# Patient Record
Sex: Male | Born: 1935
Health system: Southern US, Community
[De-identification: ages and names within clinical notes are randomized; demographics above are authoritative.]

## PROBLEM LIST (undated history)

## (undated) DIAGNOSIS — I350 Nonrheumatic aortic (valve) stenosis: Secondary | ICD-10-CM

## (undated) DIAGNOSIS — I10 Essential (primary) hypertension: Secondary | ICD-10-CM

## (undated) DIAGNOSIS — F0390 Unspecified dementia without behavioral disturbance: Secondary | ICD-10-CM

## (undated) DIAGNOSIS — I2510 Atherosclerotic heart disease of native coronary artery without angina pectoris: Secondary | ICD-10-CM

## (undated) DIAGNOSIS — E785 Hyperlipidemia, unspecified: Secondary | ICD-10-CM

## (undated) DIAGNOSIS — C44300 Unspecified malignant neoplasm of skin of unspecified part of face: Secondary | ICD-10-CM

## (undated) DIAGNOSIS — H409 Unspecified glaucoma: Secondary | ICD-10-CM

## (undated) DIAGNOSIS — Z953 Presence of xenogenic heart valve: Secondary | ICD-10-CM

## (undated) DIAGNOSIS — J189 Pneumonia, unspecified organism: Secondary | ICD-10-CM

## (undated) DIAGNOSIS — H9190 Unspecified hearing loss, unspecified ear: Secondary | ICD-10-CM

## (undated) DIAGNOSIS — I679 Cerebrovascular disease, unspecified: Secondary | ICD-10-CM

## (undated) HISTORY — PX: RETINAL DETACHMENT SURGERY: SHX105

## (undated) HISTORY — DX: Cerebrovascular disease, unspecified: I67.9

## (undated) HISTORY — PX: EYE SURGERY: SHX253

## (undated) HISTORY — DX: Hyperlipidemia, unspecified: E78.5

## (undated) HISTORY — DX: Nonrheumatic aortic (valve) stenosis: I35.0

## (undated) HISTORY — DX: Atherosclerotic heart disease of native coronary artery without angina pectoris: I25.10

## (undated) HISTORY — PX: CARDIAC CATHETERIZATION: SHX172

## (undated) HISTORY — PX: COLONOSCOPY W/ POLYPECTOMY: SHX1380

## (undated) HISTORY — DX: Essential (primary) hypertension: I10

## (undated) HISTORY — PX: CAROTID ENDARTERECTOMY: SUR193

## (undated) HISTORY — PX: CATARACT EXTRACTION: SUR2

---

## 1999-04-19 ENCOUNTER — Inpatient Hospital Stay (HOSPITAL_COMMUNITY): Admission: EM | Admit: 1999-04-19 | Discharge: 1999-04-22 | Payer: Self-pay

## 1999-04-19 ENCOUNTER — Encounter: Payer: Self-pay | Admitting: Emergency Medicine

## 1999-04-21 ENCOUNTER — Encounter: Payer: Self-pay | Admitting: *Deleted

## 1999-09-17 ENCOUNTER — Other Ambulatory Visit: Admission: RE | Admit: 1999-09-17 | Discharge: 1999-09-17 | Payer: Self-pay | Admitting: Gastroenterology

## 1999-09-17 ENCOUNTER — Encounter (INDEPENDENT_AMBULATORY_CARE_PROVIDER_SITE_OTHER): Payer: Self-pay

## 2004-05-01 ENCOUNTER — Ambulatory Visit: Payer: Self-pay | Admitting: Internal Medicine

## 2005-03-18 ENCOUNTER — Ambulatory Visit: Payer: Self-pay | Admitting: Cardiology

## 2005-04-02 ENCOUNTER — Ambulatory Visit: Payer: Self-pay

## 2005-04-30 ENCOUNTER — Ambulatory Visit: Payer: Self-pay | Admitting: Cardiology

## 2006-01-14 ENCOUNTER — Ambulatory Visit: Payer: Self-pay | Admitting: Gastroenterology

## 2006-02-25 ENCOUNTER — Ambulatory Visit: Payer: Self-pay | Admitting: Gastroenterology

## 2006-02-25 ENCOUNTER — Encounter (INDEPENDENT_AMBULATORY_CARE_PROVIDER_SITE_OTHER): Payer: Self-pay | Admitting: Specialist

## 2006-03-22 ENCOUNTER — Ambulatory Visit: Payer: Self-pay

## 2006-03-24 ENCOUNTER — Ambulatory Visit: Payer: Self-pay | Admitting: Cardiology

## 2006-04-01 ENCOUNTER — Ambulatory Visit: Payer: Self-pay

## 2006-04-01 ENCOUNTER — Encounter: Payer: Self-pay | Admitting: Cardiovascular Disease

## 2007-03-02 ENCOUNTER — Ambulatory Visit: Payer: Self-pay | Admitting: Cardiology

## 2007-03-02 LAB — CONVERTED CEMR LAB
ALT: 15 units/L (ref 0–53)
AST: 18 units/L (ref 0–37)
Albumin: 3.9 g/dL (ref 3.5–5.2)
Alkaline Phosphatase: 38 units/L — ABNORMAL LOW (ref 39–117)
BUN: 22 mg/dL (ref 6–23)
Bilirubin, Direct: 0.1 mg/dL (ref 0.0–0.3)
CO2: 30 meq/L (ref 19–32)
Calcium: 9 mg/dL (ref 8.4–10.5)
Chloride: 110 meq/L (ref 96–112)
Cholesterol: 142 mg/dL (ref 0–200)
Creatinine, Ser: 0.9 mg/dL (ref 0.4–1.5)
GFR calc Af Amer: 106 mL/min
GFR calc non Af Amer: 87 mL/min
Glucose, Bld: 105 mg/dL — ABNORMAL HIGH (ref 70–99)
HDL: 34.3 mg/dL — ABNORMAL LOW (ref >39.0)
LDL Cholesterol: 95 mg/dL (ref 0–99)
Potassium: 4.3 meq/L (ref 3.5–5.1)
Sodium: 145 meq/L (ref 135–145)
Total Bilirubin: 0.9 mg/dL (ref 0.3–1.2)
Total CHOL/HDL Ratio: 4.1
Total Protein: 6.4 g/dL (ref 6.0–8.3)
Triglycerides: 66 mg/dL (ref 0–149)
VLDL: 13 mg/dL (ref 0–40)

## 2007-03-09 ENCOUNTER — Ambulatory Visit: Payer: Self-pay

## 2007-03-09 ENCOUNTER — Encounter: Payer: Self-pay | Admitting: Cardiology

## 2007-06-09 ENCOUNTER — Ambulatory Visit: Payer: Self-pay | Admitting: Cardiology

## 2007-06-09 LAB — CONVERTED CEMR LAB
ALT: 17 units/L (ref 0–53)
AST: 19 units/L (ref 0–37)
Albumin: 3.8 g/dL (ref 3.5–5.2)
Alkaline Phosphatase: 39 units/L (ref 39–117)
Bilirubin, Direct: 0.2 mg/dL (ref 0.0–0.3)
Cholesterol: 133 mg/dL (ref 0–200)
HDL: 37.9 mg/dL — ABNORMAL LOW (ref >39.0)
LDL Cholesterol: 83 mg/dL (ref 0–99)
Total Bilirubin: 0.9 mg/dL (ref 0.3–1.2)
Total CHOL/HDL Ratio: 3.5
Total Protein: 6.3 g/dL (ref 6.0–8.3)
Triglycerides: 60 mg/dL (ref 0–149)
VLDL: 12 mg/dL (ref 0–40)

## 2008-03-29 ENCOUNTER — Ambulatory Visit: Payer: Self-pay | Admitting: Cardiology

## 2008-03-29 ENCOUNTER — Ambulatory Visit: Payer: Self-pay

## 2008-04-10 ENCOUNTER — Ambulatory Visit: Payer: Self-pay | Admitting: Cardiology

## 2008-04-10 ENCOUNTER — Ambulatory Visit: Payer: Self-pay

## 2008-04-10 ENCOUNTER — Encounter: Payer: Self-pay | Admitting: Cardiology

## 2008-04-10 LAB — CONVERTED CEMR LAB
ALT: 14 units/L (ref 0–53)
AST: 19 units/L (ref 0–37)
Albumin: 3.9 g/dL (ref 3.5–5.2)
Alkaline Phosphatase: 34 units/L — ABNORMAL LOW (ref 39–117)
BUN: 12 mg/dL (ref 6–23)
Bilirubin, Direct: 0.2 mg/dL (ref 0.0–0.3)
CO2: 31 meq/L (ref 19–32)
Calcium: 8.9 mg/dL (ref 8.4–10.5)
Chloride: 105 meq/L (ref 96–112)
Cholesterol: 127 mg/dL (ref 0–200)
Creatinine, Ser: 1 mg/dL (ref 0.4–1.5)
GFR calc Af Amer: 93 mL/min
GFR calc non Af Amer: 77 mL/min
Glucose, Bld: 101 mg/dL — ABNORMAL HIGH (ref 70–99)
HDL: 40.8 mg/dL (ref >39.0)
LDL Cholesterol: 73 mg/dL (ref 0–99)
Potassium: 4.1 meq/L (ref 3.5–5.1)
Sodium: 142 meq/L (ref 135–145)
Total Bilirubin: 1.2 mg/dL (ref 0.3–1.2)
Total CHOL/HDL Ratio: 3.1
Total Protein: 6.2 g/dL (ref 6.0–8.3)
Triglycerides: 64 mg/dL (ref 0–149)
VLDL: 13 mg/dL (ref 0–40)

## 2008-11-12 ENCOUNTER — Encounter (INDEPENDENT_AMBULATORY_CARE_PROVIDER_SITE_OTHER): Payer: Self-pay | Admitting: *Deleted

## 2008-12-27 ENCOUNTER — Encounter (INDEPENDENT_AMBULATORY_CARE_PROVIDER_SITE_OTHER): Payer: Self-pay | Admitting: *Deleted

## 2009-01-02 DIAGNOSIS — I080 Rheumatic disorders of both mitral and aortic valves: Secondary | ICD-10-CM | POA: Insufficient documentation

## 2009-01-02 DIAGNOSIS — I359 Nonrheumatic aortic valve disorder, unspecified: Secondary | ICD-10-CM | POA: Insufficient documentation

## 2009-01-02 DIAGNOSIS — R0602 Shortness of breath: Secondary | ICD-10-CM | POA: Insufficient documentation

## 2009-01-02 DIAGNOSIS — I10 Essential (primary) hypertension: Secondary | ICD-10-CM | POA: Insufficient documentation

## 2009-01-02 DIAGNOSIS — E785 Hyperlipidemia, unspecified: Secondary | ICD-10-CM | POA: Insufficient documentation

## 2009-01-02 DIAGNOSIS — I679 Cerebrovascular disease, unspecified: Secondary | ICD-10-CM | POA: Insufficient documentation

## 2009-01-04 ENCOUNTER — Ambulatory Visit: Payer: Self-pay | Admitting: Cardiology

## 2009-01-04 DIAGNOSIS — R209 Unspecified disturbances of skin sensation: Secondary | ICD-10-CM | POA: Insufficient documentation

## 2009-01-11 ENCOUNTER — Ambulatory Visit: Payer: Self-pay | Admitting: Cardiology

## 2009-01-14 LAB — CONVERTED CEMR LAB
ALT: 16 units/L (ref 0–53)
AST: 24 units/L (ref 0–37)
Albumin: 3.8 g/dL (ref 3.5–5.2)
Alkaline Phosphatase: 34 units/L — ABNORMAL LOW (ref 39–117)
BUN: 19 mg/dL (ref 6–23)
Bilirubin, Direct: 0.1 mg/dL (ref 0.0–0.3)
CO2: 29 meq/L (ref 19–32)
Calcium: 8.6 mg/dL (ref 8.4–10.5)
Chloride: 111 meq/L (ref 96–112)
Cholesterol: 132 mg/dL (ref 0–200)
Creatinine, Ser: 0.9 mg/dL (ref 0.4–1.5)
GFR calc non Af Amer: 86.9 mL/min (ref >60)
Glucose, Bld: 98 mg/dL (ref 70–99)
HDL: 45.3 mg/dL (ref >39.00)
LDL Cholesterol: 76 mg/dL (ref 0–99)
Potassium: 3.7 meq/L (ref 3.5–5.1)
Sodium: 143 meq/L (ref 135–145)
Total Bilirubin: 1.1 mg/dL (ref 0.3–1.2)
Total CHOL/HDL Ratio: 3
Total Protein: 6.1 g/dL (ref 6.0–8.3)
Triglycerides: 55 mg/dL (ref 0.0–149.0)
VLDL: 11 mg/dL (ref 0.0–40.0)

## 2009-03-28 ENCOUNTER — Encounter: Payer: Self-pay | Admitting: Cardiology

## 2009-03-28 ENCOUNTER — Ambulatory Visit: Payer: Self-pay

## 2009-07-03 ENCOUNTER — Ambulatory Visit: Payer: Self-pay | Admitting: Cardiology

## 2009-07-12 ENCOUNTER — Telehealth: Payer: Self-pay | Admitting: Gastroenterology

## 2009-07-16 ENCOUNTER — Ambulatory Visit (HOSPITAL_COMMUNITY): Admission: RE | Admit: 2009-07-16 | Discharge: 2009-07-16 | Payer: Self-pay | Admitting: Cardiology

## 2009-07-16 ENCOUNTER — Encounter: Payer: Self-pay | Admitting: Cardiology

## 2009-07-16 ENCOUNTER — Ambulatory Visit: Payer: Self-pay

## 2009-07-16 ENCOUNTER — Ambulatory Visit: Payer: Self-pay | Admitting: Cardiology

## 2009-09-25 ENCOUNTER — Telehealth: Payer: Self-pay | Admitting: Cardiology

## 2009-10-02 ENCOUNTER — Telehealth: Payer: Self-pay | Admitting: Cardiology

## 2010-03-10 ENCOUNTER — Encounter (INDEPENDENT_AMBULATORY_CARE_PROVIDER_SITE_OTHER): Payer: Self-pay | Admitting: *Deleted

## 2010-04-07 ENCOUNTER — Encounter (INDEPENDENT_AMBULATORY_CARE_PROVIDER_SITE_OTHER): Payer: Self-pay | Admitting: *Deleted

## 2010-04-08 ENCOUNTER — Encounter: Payer: Self-pay | Admitting: Cardiology

## 2010-04-09 ENCOUNTER — Ambulatory Visit: Payer: Self-pay | Admitting: Gastroenterology

## 2010-04-09 ENCOUNTER — Ambulatory Visit: Payer: Self-pay

## 2010-04-09 ENCOUNTER — Encounter: Payer: Self-pay | Admitting: Cardiology

## 2010-05-02 ENCOUNTER — Ambulatory Visit: Payer: Self-pay | Admitting: Gastroenterology

## 2010-05-02 ENCOUNTER — Encounter: Payer: Self-pay | Admitting: Cardiology

## 2010-05-05 ENCOUNTER — Encounter: Payer: Self-pay | Admitting: Cardiology

## 2010-05-05 ENCOUNTER — Encounter: Payer: Self-pay | Admitting: Gastroenterology

## 2010-05-07 ENCOUNTER — Encounter (INDEPENDENT_AMBULATORY_CARE_PROVIDER_SITE_OTHER): Payer: Self-pay | Admitting: *Deleted

## 2010-05-07 DIAGNOSIS — R002 Palpitations: Secondary | ICD-10-CM | POA: Insufficient documentation

## 2010-05-09 ENCOUNTER — Ambulatory Visit (HOSPITAL_COMMUNITY): Admission: RE | Admit: 2010-05-09 | Discharge: 2010-05-09 | Payer: Self-pay | Admitting: Cardiology

## 2010-05-09 ENCOUNTER — Ambulatory Visit: Payer: Self-pay

## 2010-05-09 ENCOUNTER — Ambulatory Visit: Payer: Self-pay | Admitting: Cardiology

## 2010-05-09 ENCOUNTER — Encounter: Payer: Self-pay | Admitting: Cardiology

## 2010-05-13 ENCOUNTER — Ambulatory Visit: Payer: Self-pay | Admitting: Cardiology

## 2010-05-13 ENCOUNTER — Encounter: Payer: Self-pay | Admitting: Cardiology

## 2010-05-13 DIAGNOSIS — I4949 Other premature depolarization: Secondary | ICD-10-CM | POA: Insufficient documentation

## 2010-05-16 ENCOUNTER — Ambulatory Visit: Payer: Self-pay | Admitting: Cardiology

## 2010-05-19 ENCOUNTER — Encounter: Payer: Self-pay | Admitting: Cardiology

## 2010-05-19 LAB — CONVERTED CEMR LAB
ALT: 25 units/L (ref 0–53)
AST: 39 units/L — ABNORMAL HIGH (ref 0–37)
Albumin: 4 g/dL (ref 3.5–5.2)
Alkaline Phosphatase: 35 units/L — ABNORMAL LOW (ref 39–117)
BUN: 18 mg/dL (ref 6–23)
Bilirubin, Direct: 0.2 mg/dL (ref 0.0–0.3)
CO2: 29 meq/L (ref 19–32)
Calcium: 8.8 mg/dL (ref 8.4–10.5)
Chloride: 105 meq/L (ref 96–112)
Cholesterol: 121 mg/dL (ref 0–200)
Creatinine, Ser: 1 mg/dL (ref 0.4–1.5)
GFR calc non Af Amer: 74.11 mL/min (ref >60)
Glucose, Bld: 86 mg/dL (ref 70–99)
HDL: 42.3 mg/dL (ref >39.00)
LDL Cholesterol: 71 mg/dL (ref 0–99)
Potassium: 4.2 meq/L (ref 3.5–5.1)
Sodium: 142 meq/L (ref 135–145)
Total Bilirubin: 1.2 mg/dL (ref 0.3–1.2)
Total CHOL/HDL Ratio: 3
Total Protein: 5.9 g/dL — ABNORMAL LOW (ref 6.0–8.3)
Triglycerides: 41 mg/dL (ref 0.0–149.0)
VLDL: 8.2 mg/dL (ref 0.0–40.0)

## 2010-07-01 ENCOUNTER — Telehealth: Payer: Self-pay | Admitting: Cardiology

## 2010-07-29 NOTE — Letter (Signed)
Summary: Letter from Patient to Doctor  Letter from Patient to Doctor   Imported By: Marylou Mccoy 05/13/2010 15:13:39  _____________________________________________________________________  External Attachment:    Type:   Image     Comment:   External Document

## 2010-07-29 NOTE — Letter (Signed)
Summary: Custom - Lipid  [facility], Main Office  [address]   [place], [place] [address]   Phone: [phone]  Fax: [phone]     May 19, 2010 MRN: [id number]   [name] [address] [place], [place]  [address]   Dear Mr. FEHRINGER,  We have reviewed your cholesterol results.  They are as follows:     Total Cholesterol:    121 (Desirable: less than 200)       HDL  Cholesterol:     42.30  (Desirable: greater than 40 for men and 50 for women)       LDL Cholesterol:       71  (Desirable: less than 100 for low risk and less than 70 for moderate to high risk)       Triglycerides:       41.0  (Desirable: less than 150)  Our recommendations include: NO CHANGES KEEP DOING WHAT YOUR DOING   Call our office at the number listed above if you have any questions.  Lowering your LDL cholesterol is important, but it is only one of a large number of "risk factors" that may indicate that you are at risk for heart disease, stroke or other complications of hardening of the arteries.  Other risk factors include:   A.  Cigarette Smoking* B.  High Blood Pressure* C.  Obesity* D.   Low HDL Cholesterol (see yours above)* E.   Diabetes Mellitus (higher risk if your is uncontrolled) F.  Family history of premature heart disease G.  Previous history of stroke or cardiovascular disease    *These are risk factors YOU HAVE CONTROL OVER.  For more information, visit .  There is now evidence that lowering the TOTAL CHOLESTEROL AND LDL CHOLESTEROL can reduce the risk of heart disease.  The American Heart Association recommends the following guidelines for the treatment of elevated cholesterol:  1.  If there is now current heart disease and less than two risk factors, TOTAL CHOLESTEROL should be less than 200 and LDL CHOLESTEROL should be less than 100. 2.  If there is current heart disease or two or more risk factors, TOTAL CHOLESTEROL should be less than 200 and LDL  CHOLESTEROL should be less than 70.  A diet low in cholesterol, saturated fat, and calories is the cornerstone of treatment for elevated cholesterol.  Cessation of smoking and exercise are also important in the management of elevated cholesterol and preventing vascular disease.  Studies have shown that 30 to 60 minutes of physical activity most days can help lower blood pressure, lower cholesterol, and keep your weight at a healthy level.  Drug therapy is used when cholesterol levels do not respond to therapeutic lifestyle changes (smoking cessation, diet, and exercise) and remains unacceptably high.  If medication is started, it is important to have you levels checked periodically to evaluate the need for further treatment options.  Thank you,   CHRISTINE YORK LPN [employer] Team

## 2010-07-29 NOTE — Assessment & Plan Note (Signed)
Summary: 6 MO F/U  Medications Added NIASPAN 1000 MG CR-TABS (NIACIN (ANTIHYPERLIPIDEMIC)) 1 tab by mouth once daily        History of Present Illness: [name] is a pleasant  gentleman who has a history of cerebrovascular disease status post carotid endarterectomy, hypertension, hyperlipidemia, and moderate aortic stenosis. Last echocardiogram in October of 2009 showed normal LV function and moderate aortic stenosis with a mean gradient of 22 mmHg. There was mild left atrial enlargement. Last carotid Dopplers in September, 2010 showed 0-39% bilateral stenosis. I last saw him in July of 2010. Since then the patient has dyspnea with more extreme activities and not with routine activities. It is relieved with rest. It is not associated with chest pain. There is no orthopnea, PND or pedal edema. There is no syncope or palpitations. There is no exertional chest pain.   Current Medications (verified): 1)  Metoprolol Succinate 50 Mg Xr24h-Tab (Metoprolol Succinate) .... Take One Tablet By Mouth Daily 2)  Amlodipine Besylate 5 Mg Tabs (Amlodipine Besylate) .... Take One Tablet By Mouth Daily 3)  Aspirin Ec 325 Mg Tbec (Aspirin) .... Take One Tablet By Mouth Daily 4)  Zetia 10 Mg Tabs (Ezetimibe) .... Take One Tablet By Mouth Daily. 5)  Dorzolamide Hcl 2 % Soln (Dorzolamide Hcl) .... One Drop To Each Eye Twice Daily 6)  Xalatan 0.005 % Soln (Latanoprost) .... One Drop To Each Eye At Bedtime 7)  Niaspan 1000 Mg Cr-Tabs (Niacin (Antihyperlipidemic)) .Marland Kitchen.. 1 Tab By Mouth Once Daily  Allergies: No Known Drug Allergies  Past History:  Past Medical History: Current Problems:  CEREBROVASCULAR DISEASE (ICD-437.9) AORTIC STENOSIS (ICD-424.1) HYPERLIPIDEMIA (ICD-272.4) HYPERTENSION (ICD-401.9)  Past Surgical History: Reviewed history from 01/02/2009 and no changes required.  carotid endarterectomy,   Social History: Reviewed history from 01/02/2009 and no changes required. Married  Tobacco Use -  No.  Alcohol Use - no  Review of Systems       no fevers or chills, productive cough, hemoptysis, dysphasia, odynophagia, melena, hematochezia, dysuria, hematuria, rash, seizure activity, orthopnea, PND, pedal edema, claudication. Remaining systems are negative.   Vital Signs:  Patient profile:   77 year old male Height:      68 inches Weight:      162 pounds BMI:     24.72 Pulse rate:   74 / minute Resp:     12 per minute BP sitting:   122 / 80  (left arm)  Vitals Entered By: Kem Parkinson (July 03, 2009 9:09 AM)  Physical Exam  General:  Well-developed well-nourished in no acute distress.  Skin is warm and dry.  HEENT is normal.  Neck is supple. No thyromegaly.  Chest is clear to auscultation with normal expansion.  Cardiovascular exam is regular rate and rhythm. 3/6 systolic murmur at the left sternal border radiating to the carotids. S2 is not diminished. 2/6 diastolic murmur left sternal border. Abdominal exam nontender or distended. No masses palpated. Extremities show no edema. neuro grossly intact    EKG  Procedure date:  07/03/2009  Findings:      Sinus rhythm with frequent PACs and a rare PVC. Nonspecific ST changes.  Impression & Recommendations:  Problem # 1:  CEREBROVASCULAR DISEASE (ICD-437.9) Continue aspirin and statin. Followup carotid Dopplers one year.  Problem # 2:  AORTIC STENOSIS (ICD-424.1)  Plan repeat echocardiogram. No symptoms of chest pain, increased dyspnea or syncope. May need aortic valve replacement in the future. His updated medication list for this problem includes:  Metoprolol Succinate 50 Mg Xr24h-tab (Metoprolol succinate) .Marland Kitchen... Take one tablet by mouth daily  His updated medication list for this problem includes:    Metoprolol Succinate 50 Mg Xr24h-tab (Metoprolol succinate) .Marland Kitchen... Take one tablet by mouth daily  Orders: Echocardiogram (Echo)  Problem # 3:  HYPERLIPIDEMIA (ICD-272.4)  Continue present  medications. History of intolerance to statins. His updated medication list for this problem includes:    Zetia 10 Mg Tabs (Ezetimibe) .Marland Kitchen... Take one tablet by mouth daily.    Niaspan 1000 Mg Cr-tabs (Niacin (antihyperlipidemic)) .Marland Kitchen... 1 tab by mouth once daily  His updated medication list for this problem includes:    Zetia 10 Mg Tabs (Ezetimibe) .Marland Kitchen... Take one tablet by mouth daily.  Problem # 4:  HYPERTENSION (ICD-401.9)  His updated medication list for this problem includes:    Metoprolol Succinate 50 Mg Xr24h-tab (Metoprolol succinate) .Marland Kitchen... Take one tablet by mouth daily    Amlodipine Besylate 5 Mg Tabs (Amlodipine besylate) .Marland Kitchen... Take one tablet by mouth daily    Aspirin Ec 325 Mg Tbec (Aspirin) .Marland Kitchen... Take one tablet by mouth daily  His updated medication list for this problem includes:    Metoprolol Succinate 50 Mg Xr24h-tab (Metoprolol succinate) .Marland Kitchen... Take one tablet by mouth daily    Amlodipine Besylate 5 Mg Tabs (Amlodipine besylate) .Marland Kitchen... Take one tablet by mouth daily    Aspirin Ec 325 Mg Tbec (Aspirin) .Marland Kitchen... Take one tablet by mouth daily  Patient Instructions: 1)  Your physician recommends that you schedule a follow-up appointment in: ONE YEAR 2)  Your physician has requested that you have an echocardiogram.  Echocardiography is a painless test that uses sound waves to create images of your heart. It provides your doctor with information about the size and shape of your heart and how well your heart's chambers and valves are working.  This procedure takes approximately one hour. There are no restrictions for this procedure.

## 2010-07-29 NOTE — Procedures (Signed)
Summary: Colonoscopy  Patient: [name] Note: All result statuses are Final unless otherwise noted.  Tests: (1) Colonoscopy (COL)   COL Colonoscopy           DONE     [facility]     [address]     [place], [place]  [address]           COLONOSCOPY PROCEDURE REPORT     PATIENT:  [name], [name]  MR#:  [id number]     BIRTHDATE:  28-Dec-1929, 80 yrs. old  GENDER:  male     ENDOSCOPIST:  Judie Petit T. Russella Dar, MD, [facility]           PROCEDURE DATE:  05/02/2010     PROCEDURE:  Colonoscopy with snare polypectomy     ASA CLASS:  Class II     INDICATIONS:  1) surveillance and high-risk screening  2) history     of adenomatous colon polyps  3) family history of colon cancer,     tubulovillous adenoma, 08/1999.     MEDICATIONS:   Fentanyl 25 mcg IV, Versed 2 mg IV     DESCRIPTION OF PROCEDURE:   After the risks benefits and     alternatives of the procedure were thoroughly explained, informed     consent was obtained.  Digital rectal exam was performed and     revealed no abnormalities.   The LB [id number] endoscope     was introduced through the anus and advanced to the cecum, which     was identified by both the appendix and ileocecal valve, without     limitations.  The quality of the prep was excellent, using     MoviPrep.  The instrument was then slowly withdrawn as the colon     was fully examined.     <<PROCEDUREIMAGES>>     FINDINGS:  A sessile polyp was found at the hepatic flexure. It     was 5 mm in size. Polyp was snared without cautery. Retrieval was     successful. A sessile polyp was found in the distal transverse     colon. It was 6 mm in size. Polyp was snared without cautery.     Retrieval was successful. Mild diverticulosis was found in the     sigmoid colon. This was otherwise a normal examination of the     colon. Retroflexed views in the rectum revealed internal     hemorrhoids, moderate.  The time to cecum =  2.75  minutes. The     scope was then withdrawn  (time =  11 min) from the patient and the     procedure completed.           COMPLICATIONS:  None           ENDOSCOPIC IMPRESSION:     1) 5 mm sessile polyp at the hepatic flexure     2) 6 mm sessile polyp in the distal transverse colon     3) Mild diverticulosis in the sigmoid colon     4) Internal hemorrhoids           RECOMMENDATIONS:     1) Await pathology results     2) High fiber diet with liberal fluid intake.     3) Repeat Colonoscopy in 3 years.           Venita Lick. Russella Dar, MD, [facility]           n.  eSIGNED:   Malcolm T. Stark at 05/02/2010 10:52 AM           Hermelinda Dellen, [id number]  Note: An exclamation mark (!) indicates a result that was not dispersed into the flowsheet. Document Creation Date: 05/02/2010 10:52 AM _______________________________________________________________________  (1) Order result status: Final Collection or observation date-time: 05/02/2010 10:45 Requested date-time:  Receipt date-time:  Reported date-time:  Referring Physician:   Ordering Physician: Claudette Head [phone]) Specimen Source:  Source: Launa Grill Order Number: [id number] Lab site:   Appended Document: Colonoscopy     Procedures Next Due Date:    Colonoscopy: 04/2013

## 2010-07-29 NOTE — Progress Notes (Signed)
Summary: refill**[facility]**   Phone Note Refill Request Call back at Home Phone [phone] Message from:  Patient on October 02, 2009 2:54 PM  Refills Requested: Medication #1:  RAMIPRIL 5 MG CAPS Take one capsule by mouth daily   Supply Requested: 3 months [facility] [phone]   Method Requested: Fax to Mail Away Pharmacy Initial call taken by: Migdalia Dk,  October 02, 2009 2:54 PM Caller: Patient    Prescriptions: RAMIPRIL 5 MG CAPS (RAMIPRIL) Take one capsule by mouth daily  #90 x 3   Entered by:   Kem Parkinson   Authorized by:   Ferman Hamming, MD, [facility]   Signed by:   Kem Parkinson on 10/02/2009   Method used:   Electronically to        [facility]* (retail)       [address]       [place], [place]  [address]       Ph: [phone]       Fax: [phone]   RxID:   [id number]

## 2010-07-29 NOTE — Assessment & Plan Note (Signed)
Summary: rov/discuss echo/dm    History of Present Illness: [name] is a pleasant  gentleman who has a history of cerebrovascular disease status post carotid endarterectomy, hypertension, hyperlipidemia, and aortic stenosis. Last echocardiogram in Nov 2011 showed normal LV function and severe AS per JK with a mean gradient of 36 mmHg. There was mild AI. There was mild right atrial  and right ventricular enlargement. Last carotid Dopplers in Oct 2011showed 0-39% bilateral stenosis and F/U recommended in 2 years. I last saw [name] in Jan 2011. Since then the patient has dyspnea with more extreme activities and not with routine activities. It is relieved with rest. It is not associated with chest pain. There is no orthopnea, PND or pedal edema. There is no syncope or palpitations. There is no exertional chest pain. His symptoms are unchanged compared to previous.  Current Medications (verified): 1)  Metoprolol Succinate 50 Mg Xr24h-Tab (Metoprolol Succinate) .... Take One Tablet By Mouth Daily 2)  Ramipril 5 Mg Caps (Ramipril) .... Take One Capsule By Mouth Daily 3)  Aspirin Ec 325 Mg Tbec (Aspirin) .... Take One Tablet By Mouth Daily 4)  Zetia 10 Mg Tabs (Ezetimibe) .... Take One Tablet By Mouth Daily. 5)  Dorzolamide Hcl 2 % Soln (Dorzolamide Hcl) .... One Drop To Each Eye Twice Daily 6)  Xalatan 0.005 % Soln (Latanoprost) .... One Drop To Each Eye At Bedtime 7)  Niaspan 1000 Mg Cr-Tabs (Niacin (Antihyperlipidemic)) .Marland Kitchen.. 1 Tab By Mouth Once Daily  Allergies: No Known Drug Allergies  Past History:  Past Medical History: CEREBROVASCULAR DISEASE (ICD-437.9) AORTIC STENOSIS (ICD-424.1) HYPERLIPIDEMIA (ICD-272.4) HYPERTENSION (ICD-401.9)  Past Surgical History:  carotid endarterectomy  Social History: Reviewed history from 01/02/2009 and no changes required. Married  Tobacco Use - No.  Alcohol Use - no  Review of Systems       no fevers or chills, productive cough, hemoptysis,  dysphasia, odynophagia, melena, hematochezia, dysuria, hematuria, rash, seizure activity, orthopnea, PND, pedal edema, claudication. Remaining systems are negative.   Vital Signs:  Patient profile:   77 year old male Height:      68 inches Weight:      160 pounds BMI:     24.42 Pulse rate:   70 / minute Resp:     12 per minute BP sitting:   144 / 54  (left arm)  Vitals Entered By: Kem Parkinson (May 13, 2010 12:02 PM)  Physical Exam  General:  Well-developed well-nourished in no acute distress.  Skin is warm and dry.  HEENT is normal.  Neck is supple. No thyromegaly.  Chest is clear to auscultation with normal expansion.  Cardiovascular exam is regular rate and rhythm. 3/6 systolic murmur left sternal border. S2 is not diminished. Abdominal exam nontender or distended. No masses palpated. Extremities show no edema. neuro grossly intact    EKG  Procedure date:  05/13/2010  Findings:      Sinus rhythm with ventricular bigeminy.  Impression & Recommendations:  Problem # 1:  CEREBROVASCULAR DISEASE (ICD-437.9) Continue aspirin. Intolerant to statins. Followup carotid Dopplers October 2013.  Problem # 2:  AORTIC STENOSIS (ICD-424.1) Patient's aortic stenosis has progressed. He is now moderate to severe. However he is not having symptoms. Discussed importance of watching for new onset dyspnea, chest pain or syncope. Plan repeat echocardiogram in 6 months and followup at that time. He will most likely need aortic valve replacement in the future. His updated medication list for this problem includes:    Metoprolol Succinate 50 Mg  Xr24h-tab (Metoprolol succinate) .Marland Kitchen... Take one tablet by mouth daily    Ramipril 5 Mg Caps (Ramipril) .Marland Kitchen... Take one capsule by mouth daily  His updated medication list for this problem includes:    Metoprolol Succinate 50 Mg Xr24h-tab (Metoprolol succinate) .Marland Kitchen... Take one tablet by mouth daily    Ramipril 5 Mg Caps (Ramipril) .Marland Kitchen... Take one  capsule by mouth daily  Problem # 3:  HYPERLIPIDEMIA (ICD-272.4)  Continue present medications. Intolerant to statins. Check lipids and liver. His updated medication list for this problem includes:    Zetia 10 Mg Tabs (Ezetimibe) .Marland Kitchen... Take one tablet by mouth daily.    Niaspan 1000 Mg Cr-tabs (Niacin (antihyperlipidemic)) .Marland Kitchen... 1 tab by mouth once daily  His updated medication list for this problem includes:    Zetia 10 Mg Tabs (Ezetimibe) .Marland Kitchen... Take one tablet by mouth daily.    Niaspan 1000 Mg Cr-tabs (Niacin (antihyperlipidemic)) .Marland Kitchen... 1 tab by mouth once daily  Problem # 4:  HYPERTENSION (ICD-401.9)  Blood pressure controlled. Continue present medications. Check potassium and renal function. His updated medication list for this problem includes:    Metoprolol Succinate 50 Mg Xr24h-tab (Metoprolol succinate) .Marland Kitchen... Take one tablet by mouth daily    Ramipril 5 Mg Caps (Ramipril) .Marland Kitchen... Take one capsule by mouth daily    Aspirin Ec 325 Mg Tbec (Aspirin) .Marland Kitchen... Take one tablet by mouth daily  His updated medication list for this problem includes:    Metoprolol Succinate 50 Mg Xr24h-tab (Metoprolol succinate) .Marland Kitchen... Take one tablet by mouth daily    Ramipril 5 Mg Caps (Ramipril) .Marland Kitchen... Take one capsule by mouth daily    Aspirin Ec 325 Mg Tbec (Aspirin) .Marland Kitchen... Take one tablet by mouth daily  Problem # 5:  PREMATURE VENTRICULAR CONTRACTIONS (ICD-427.69) Patient noted to have occasional PVCs and couplets at time of recent colonoscopy. He is having no symptoms. LV function normal. Continue beta blocker. His updated medication list for this problem includes:    Metoprolol Succinate 50 Mg Xr24h-tab (Metoprolol succinate) .Marland Kitchen... Take one tablet by mouth daily    Ramipril 5 Mg Caps (Ramipril) .Marland Kitchen... Take one capsule by mouth daily    Aspirin Ec 325 Mg Tbec (Aspirin) .Marland Kitchen... Take one tablet by mouth daily  Patient Instructions: 1)  Your physician recommends that you return for lab work FASTING 2)   Your physician wants you to follow-up in:6 MONTHS   You will receive a reminder letter in the mail two months in advance. If you don't receive a letter, please call our office to schedule the follow-up appointment. 3)  Your physician has requested that you have an echocardiogram.  Echocardiography is a painless test that uses sound waves to create images of your heart. It provides your doctor with information about the size and shape of your heart and how well your heart's chambers and valves are working.  This procedure takes approximately one hour. There are no restrictions for this procedure.SCHEDULE PRIOR TO APPT IN 6 MONTHS

## 2010-07-29 NOTE — Letter (Signed)
Summary: Pre Visit Letter Revised  [facility]  [address] [place], [place] [address]   Phone: [phone]  Fax: [phone]        03/10/2010 MRN: [id number] [name] [address] [place], [place]  [address]             Procedure Date:  05/02/2010   Welcome to the Gastroenterology Division at [facility].    You are scheduled to see a nurse for your pre-procedure visit on 04/09/2010 at 2:30PM on the 3rd floor at [facility], [address]  We ask that you try to arrive at our office 15 minutes prior to your appointment time to allow for check-in.  Please take a minute to review the attached form.  If you answer "Yes" to one or more of the questions on the first page, we ask that you call the person listed at your earliest opportunity.  If you answer "No" to all of the questions, please complete the rest of the form and bring it to your appointment.    Your nurse visit will consist of discussing your medical and surgical history, your immediate family medical history, and your medications.   If you are unable to list all of your medications on the form, please bring the medication bottles to your appointment and we will list them.  We will need to be aware of both prescribed and over the counter drugs.  We will need to know exact dosage information as well.    Please be prepared to read and sign documents such as consent forms, a financial agreement, and acknowledgement forms.  If necessary, and with your consent, a friend or relative is welcome to sit-in on the nurse visit with you.  Please bring your insurance card so that we may make a copy of it.  If your insurance requires a referral to see a specialist, please bring your referral form from your primary care physician.  No co-pay is required for this nurse visit.     If you cannot keep your appointment, please call [phone] to cancel or reschedule prior to your appointment date.  This  allows [place] the opportunity to schedule an appointment for another patient in need of care.    Thank you for choosing [facility] for your medical needs.  We appreciate the opportunity to care for you.  Please visit [place] at our website  to learn more about our practice.  Sincerely, The Gastroenterology Division

## 2010-07-29 NOTE — Letter (Signed)
Summary: Patient Notice- Polyp Results  [facility]  [address] [place], [place] [address]   Phone: [phone]  Fax: [phone]        May 05, 2010 MRN: [id number]    [name] [address] [place], [place]  [address]    Dear Mr. REMO,  I am pleased to inform you that the colon polyp(s) removed during your recent colonoscopy was (were) found to be benign (no cancer detected) upon pathologic examination.  I recommend you have a repeat colonoscopy examination in 3 years to look for recurrent polyps, as having colon polyps increases your risk for having recurrent polyps or even colon cancer in the future.  Should you develop new or worsening symptoms of abdominal pain, bowel habit changes or bleeding from the rectum or bowels, please schedule an evaluation with either your primary care physician or with me.  Continue treatment plan as outlined the day of your exam.  Please call us if you are having persistent problems or have questions about your condition that have not been fully answered at this time.  Sincerely,  Meryl Dare MD [facility]  This letter has been electronically signed by your physician.  Appended Document: Patient Notice- Polyp Results letter mailed

## 2010-07-29 NOTE — Letter (Signed)
Summary: [facility] Instructions  [facility]  [address] [place], [place] [address]   Phone: [phone]  Fax: [phone]       [name]    Sep 03, 1929    MRN: [id number]        Procedure Day /Date:  Friday 05/02/2010     Arrival Time: 9:00 am      Procedure Time: 10:00 am     Location of Procedure:                    _x _  [facility] (4th Floor)                        PREPARATION FOR COLONOSCOPY WITH MOVIPREP   Starting 5 days prior to your procedure Sunday 10/30 do not eat nuts, seeds, popcorn, corn, beans, peas,  salads, or any raw vegetables.  Do not take any fiber supplements (e.g. Metamucil, Citrucel, and Benefiber).  THE DAY BEFORE YOUR PROCEDURE         DATE: Thursday 11/3  1.  Drink clear liquids the entire day-NO SOLID FOOD  2.  Do not drink anything colored red or purple.  Avoid juices with pulp.  No orange juice.  3.  Drink at least 64 oz. (8 glasses) of fluid/clear liquids during the day to prevent dehydration and help the prep work efficiently.  CLEAR LIQUIDS INCLUDE: Water Jello Ice Popsicles Tea (sugar ok, no milk/cream) Powdered fruit flavored drinks Coffee (sugar ok, no milk/cream) Gatorade Juice: apple, white grape, white cranberry  Lemonade Clear bullion, consomm, broth Carbonated beverages (any kind) Strained chicken noodle soup Hard Candy                             4.  In the morning, mix first dose of MoviPrep solution:    Empty 1 Pouch A and 1 Pouch B into the disposable container    Add lukewarm drinking water to the top line of the container. Mix to dissolve    Refrigerate (mixed solution should be used within 24 hrs)  5.  Begin drinking the prep at 5:00 p.m. The MoviPrep container is divided by 4 marks.   Every 15 minutes drink the solution down to the next mark (approximately 8 oz) until the full liter is complete.   6.  Follow completed prep with 16 oz of clear liquid of your choice (Nothing red  or purple).  Continue to drink clear liquids until bedtime.  7.  Before going to bed, mix second dose of MoviPrep solution:    Empty 1 Pouch A and 1 Pouch B into the disposable container    Add lukewarm drinking water to the top line of the container. Mix to dissolve    Refrigerate  THE DAY OF YOUR PROCEDURE      DATE: Friday 11/4  Beginning at 5:00 a.m. (5 hours before procedure):         1. Every 15 minutes, drink the solution down to the next mark (approx 8 oz) until the full liter is complete.  2. Follow completed prep with 16 oz. of clear liquid of your choice.    3. You may drink clear liquids until 8:00 am (2 HOURS BEFORE PROCEDURE).   MEDICATION INSTRUCTIONS  Unless otherwise instructed, you should take regular prescription medications with a small sip of water   as early as possible the morning of  your procedure.          OTHER INSTRUCTIONS  You will need a responsible adult at least 75 years of age to accompany you and drive you home.   This person must remain in the waiting room during your procedure.  Wear loose fitting clothing that is easily removed.  Leave jewelry and other valuables at home.  However, you may wish to bring a book to read or  an iPod/MP3 player to listen to music as you wait for your procedure to start.  Remove all body piercing jewelry and leave at home.  Total time from sign-in until discharge is approximately 2-3 hours.  You should go home directly after your procedure and rest.  You can resume normal activities the  day after your procedure.  The day of your procedure you should not:   Drive   Make legal decisions   Operate machinery   Drink alcohol   Return to work  You will receive specific instructions about eating, activities and medications before you leave.    The above instructions have been reviewed and explained to me by   Durwin Glaze RN  April 09, 2010 1:35 PM     I fully understand and can verbalize  these instructions _____________________________ Date _________

## 2010-07-29 NOTE — Miscellaneous (Signed)
Summary: Orders Update  Clinical Lists Changes  Orders: Added new Test order of Carotid Duplex (Carotid Duplex) - Signed 

## 2010-07-29 NOTE — Miscellaneous (Signed)
Summary: [facility]  Clinical Lists Changes  Medications: Added new medication of MOVIPREP 100 GM  SOLR (PEG-KCL-NACL-NASULF-NA ASC-C) As per prep instructions. - Signed Rx of MOVIPREP 100 GM  SOLR (PEG-KCL-NACL-NASULF-NA ASC-C) As per prep instructions.;  #1 x 0;  Signed;  Entered by: Durwin Glaze RN;  Authorized by: Meryl Dare MD [facility];  Method used: Print then Give to Patient Observations: Added new observation of NKA: T (04/09/2010 13:17)    Prescriptions: MOVIPREP 100 GM  SOLR (PEG-KCL-NACL-NASULF-NA ASC-C) As per prep instructions.  #1 x 0   Entered by:   Durwin Glaze RN   Authorized by:   Meryl Dare MD [facility]   Signed by:   Durwin Glaze RN on 04/09/2010   Method used:   Print then Give to Patient   RxID:   [id number]

## 2010-07-29 NOTE — Progress Notes (Signed)
Summary: Schedule recall colon   Phone Note Outgoing Call Call back at [facility] Phone [phone]   Call placed by: Christie Nottingham CMA Duncan Dull),  July 12, 2009 4:16 PM Call placed to: Patient Summary of Call: Left a message with pts wife to call back and schedule recall colon. Initial call taken by: Christie Nottingham CMA Duncan Dull),  July 12, 2009 4:16 PM  Follow-up for Phone Call        Pt called back and states he wants to call back and schedule this procedure when the weather gets warmer. Pt would not give a specific month when he wanted to schedule. I informed pt the importance of scheduling this procedure now but he declines to schedule at this time.  Follow-up by: Christie Nottingham CMA Duncan Dull),  July 15, 2009 8:44 AM

## 2010-07-29 NOTE — Miscellaneous (Signed)
  Clinical Lists Changes received note from pt asking about EKGs done during colonoscopy. per dr Jens Som pt scheduled for echo and f/u appt. Deliah Goody, RN  May 07, 2010 2:52 PM   Problems: Added new problem of PALPITATIONS (ICD-785.1) Orders: Added new Referral order of Echocardiogram (Echo) - Signed

## 2010-07-29 NOTE — Progress Notes (Signed)
Summary: QUESTION ABOUT MEDICATION  Medications Added RAMIPRIL 5 MG CAPS (RAMIPRIL) Take one capsule by mouth daily       Phone Note Call from Patient Call back at [facility] Phone [phone]   Caller: Patient Summary of Call: PT CALLING WITH QUESTIONS ABOUT A MEDICATION (AMLODIPINE) Initial call taken by: Judie Grieve,  September 25, 2009 11:18 AM  Follow-up for Phone Call        spoke with pt, he recieved a 90 day supply of amlodipine from [facility] and he has never taken this med before. he states he has always taken ramapril 5mg  daily. per dr Jens Som pt to remain on ramapril 5mg  daily. Deliah Goody, RN  September 25, 2009 5:20 PM    Additional Follow-up for Phone Call Additional follow up Details #1::        spoke with [facility] and script was changed to ramapril. pt aware Deliah Goody, RN  September 26, 2009 12:31 PM     New/Updated Medications: RAMIPRIL 5 MG CAPS (RAMIPRIL) Take one capsule by mouth daily Prescriptions: RAMIPRIL 5 MG CAPS (RAMIPRIL) Take one capsule by mouth daily  #30 x 12   Entered by:   Deliah Goody, RN   Authorized by:   Ferman Hamming, MD, [facility]   Signed by:   Deliah Goody, RN on 09/25/2009   Method used:   Faxed to ...       [facility]       [address]       [place], [place]  [address]       Ph: [phone] or [phone]       Fax: [phone]   RxID:   [id number]

## 2010-07-31 NOTE — Progress Notes (Signed)
Summary: refill request-req call in today-almost out   Phone Note Refill Request Message from:  [facility]  Refills Requested: Medication #1:  METOPROLOL SUCCINATE 50 MG XR24H-TAB Take one tablet by mouth daily  Medication #2:  RAMIPRIL 5 MG CAPS Take one capsule by mouth daily pt almost out-was in [place] so he waited till the 1st of january-can be called in today?   Method Requested: Telephone to Pharmacy Initial call taken by: Glynda Jaeger,  July 01, 2010 10:43 AM    Prescriptions: RAMIPRIL 5 MG CAPS (RAMIPRIL) Take one capsule by mouth daily  #90 x 3   Entered by:   Kem Parkinson   Authorized by:   Ferman Hamming, MD, [facility]   Signed by:   Kem Parkinson on 07/01/2010   Method used:   Faxed to ...       [facility] (mail-order)             , [place]         Ph: [phone]       Fax: [phone]   RxID:   [id number] METOPROLOL SUCCINATE 50 MG XR24H-TAB (METOPROLOL SUCCINATE) Take one tablet by mouth daily  #90 x 3   Entered by:   Kem Parkinson   Authorized by:   Ferman Hamming, MD, [facility]   Signed by:   Kem Parkinson on 07/01/2010   Method used:   Faxed to ...       [facility] (mail-order)             , [place]         Ph: [phone]       Fax: [phone]   RxID:   [id number]

## 2010-09-30 ENCOUNTER — Telehealth: Payer: Self-pay | Admitting: Cardiology

## 2010-09-30 NOTE — Telephone Encounter (Signed)
LOV,12,Stress faxed to [facility] @ [phone] 09/30/10/KM

## 2010-10-01 ENCOUNTER — Ambulatory Visit (HOSPITAL_BASED_OUTPATIENT_CLINIC_OR_DEPARTMENT_OTHER)
Admission: RE | Admit: 2010-10-01 | Discharge: 2010-10-01 | Disposition: A | Discharge status: Home / Self Care | Payer: Medicare PPO | Source: Ambulatory Visit | Attending: Specialist | Admitting: Specialist

## 2010-10-01 DIAGNOSIS — I700 Atherosclerosis of aorta: Secondary | ICD-10-CM | POA: Insufficient documentation

## 2010-10-01 DIAGNOSIS — H269 Unspecified cataract: Secondary | ICD-10-CM | POA: Insufficient documentation

## 2010-10-01 DIAGNOSIS — I10 Essential (primary) hypertension: Secondary | ICD-10-CM | POA: Insufficient documentation

## 2010-10-01 DIAGNOSIS — R9431 Abnormal electrocardiogram [ECG] [EKG]: Secondary | ICD-10-CM | POA: Insufficient documentation

## 2010-10-01 DIAGNOSIS — I679 Cerebrovascular disease, unspecified: Secondary | ICD-10-CM | POA: Insufficient documentation

## 2010-10-01 DIAGNOSIS — Z01810 Encounter for preprocedural cardiovascular examination: Secondary | ICD-10-CM | POA: Insufficient documentation

## 2010-10-15 ENCOUNTER — Telehealth: Payer: Self-pay | Admitting: Cardiology

## 2010-10-15 NOTE — Telephone Encounter (Signed)
Faxed OV, EKG & Echo to [facility] @ [facility] ([phone]).

## 2010-11-11 NOTE — Assessment & Plan Note (Signed)
[facility]                            CARDIOLOGY OFFICE NOTE   [name], [name]                          MRN:          [id number]  DATE:03/02/2007                            DOB:          01/14/1930    [name] is a very pleasant 77 year old gentleman who has a history of  cerebrovascular disease status post carotid endarterectomy,  hypertension, hyperlipidemia, and mild to moderate aortic stenosis.  His  most recent echocardiogram was performed on April 01, 2006.  At that  time his LV function was normal.  There was moderate aortic stenosis and  mild aortic insufficiency.  There was mild mitral regurgitation.  Of  note his mean gradient across his aortic valve was 23 mmHg.  Since I  last saw him he denies any increased dyspnea on exertion, orthopnea,  PND, pedal edema, palpitations, presyncope, syncope or chest pain.  He  has had some pain in his hands when he awakes at night.  This is  bilateral.  He has had no other neurological symptoms.   MEDICATIONS:  1. Toprol 50 mg p.o. daily.  2. Altace 5 mg p.o. daily.  3. Zetia 10 mg p.o. daily.  4. Aspirin 325 mg p.o. daily.  5. Cosopt eye drops.  6. Alphagan eye drops.   PHYSICAL EXAMINATION:  VITAL SIGNS:  Shows blood pressure in the right  arm of 150/70, in the left arm it is 150/70, pulse 63, weight 171  pounds.  HEENT:  Normal.  NECK:  Supple.  CHEST:  Clear.  CARDIOVASCULAR:  Regular.  There is a 3/6 systolic murmur at the left  sternal border.  S2 is not diminished.  ABDOMEN:  Benign.  EXTREMITIES:  Show no edema.   His electrocardiogram shows sinus rhythm at rate of 58.  There are  occasional PVC's.  There are no ST changes noted.   DIAGNOSES:  1. Moderate aortic stenosis - we will plan to repeat his      echocardiogram.  Given his relatively young age I have explained to      him that he may require aortic valve replacement in the future.  2. Hypertension - His blood pressure  appears to be reasonably well-      controlled on his present medications.  I am hesitant to increase      his antihypertensives as he has had some side effects of      medications in the past.  3. Hyperlipidemia - he will continue on his Zetia, and we will check      lipids and liver today.  Given his ACE inhibitor use I will also      check BMET.  4. History of carotid endarterectomy - he is due for followup carotid      Dopplers.  5. History of intolerance to STATINS.   We will see him back in one year.     Madolyn Frieze Jens Som, MD, [facility]  Electronically Signed    BSC/MedQ  DD: 03/02/2007  DT: 03/02/2007  Job #: [id number]   cc:   [name],  M.D. 

## 2010-11-11 NOTE — Assessment & Plan Note (Signed)
[facility]                            CARDIOLOGY OFFICE NOTE   [name], [name]                          MRN:          [id number]  DATE:03/29/2008                            DOB:          1929-09-12    [name] is a pleasant 77 year old gentleman who has a history of  cerebrovascular disease status post carotid endarterectomy,  hypertension, hyperlipidemia, and moderate aortic stenosis.  When I last  saw him, we scheduled him to have an echocardiogram which was performed  on March 09, 2007.  His LV function was normal.  It was interpreted  as moderate severe aortic stenosis.  I did review these values and his  mean gradient was 24, and I felt that it was more likely to be moderate.  He also had mild to moderate aortic insufficiency and mild aortic root  dilatation.  There was mild mitral regurgitation.  Since he was last  seen, he is doing well from a symptomatic standpoint.  He has dyspnea  with more extreme activities, but not with routine activities.  There is  no orthopnea, PND, pedal edema, palpitations, presyncope, syncope, or  chest pain.   MEDICATIONS:  1. Metoprolol ER 50 mg p.o. daily.  2. Altace 5 mg p.o. daily.  3. Aspirin 325 mg p.o. daily.  4. Zetia 10 mg p.o. daily.  5. Niaspan 1 g p.o. daily.  6. Eyedrops.   PHYSICAL EXAMINATION:  VITAL SIGNS:  Today, shows a blood pressure is  mildly elevated at 150/66.  His pulse is 52.  HEENT:  Normal.  NECK:  Supple.  CHEST:  Clear.  CARDIOVASCULAR:  Regular rate and rhythm.  There is a 3/6 systolic  murmur heard at the left sternal border.  S2 is not diminished.  ABDOMEN:  No tenderness.  EXTREMITIES:  No edema.   His last electrocardiogram shows a sinus bradycardia rate of 51.  Occasional PVCs.  The axis is normal.  A prior septal infarct cannot be  excluded.   DIAGNOSES:  1. Moderate aortic stenosis - we will plan to proceed with a repeat      echocardiogram on Mr. Grimley.  I  have explained that he may require      aortic valve replacement in the future if this progresses.  He is      presently not having any symptoms related to this.  2. Hypertension - his blood pressure is mildly elevated today, but he      states it runs in the 130/80 range at home.  We will continue with      his present medications and he will continue to track this at home.  3. Hyperlipidemia - he will continue on his Zetia and Niaspan.  We      will check lipids and liver and adjust as indicated.  Given his ACE      inhibitor use, I will also check a BMET.  4. History of carotid endarterectomy - he will continue with his      aspirin, ACE inhibitor, beta-blocker and Zetia/Niaspan.  He had  followup carotid Dopplers today and we will await for those final      results.  5. History of intolerance to STATINS.   He will return to see [place] in 9 months.     Madolyn Frieze Jens Som, MD, [facility]  Electronically Signed    BSC/MedQ  DD: 03/29/2008  DT: 03/30/2008  Job #: [id number]   cc:   Ernestina Penna, M.D.

## 2010-11-14 NOTE — Assessment & Plan Note (Signed)
[facility]ALTHCARE                           GASTROENTEROLOGY OFFICE NOTE   [name], [name]                          MRN:          [id number]  DATE:01/14/2006                            DOB:          Mar 19, 1930    [name] is 77 year old white male who returns for followup of a family  history of colon cancer and a person history of tubulovillous adenomas.  His  father was diagnosed with colon cancer at age 78.  The patient had a 1.5 cm  tubulovillous adenoma removed from his rectum.  Had colonoscopy in March  2001 and subsequent colonoscopy in March 2004 showed no recurrent polyps.  Internal hemorrhoids were noted on both exams.  He has had no ongoing  colorectal complaints and his cardiovascular problems remain clinically  stable.  He sees Dr. Jens Som on an annual basis with a follow-up visit  planned for September 2007.  He has had no melena, hematochezia, change in  bowel habits, change in stool caliber, diarrhea, constipation, abdominal  pain, rectal pain, or weight loss.   PAST MEDICAL HISTORY:  1.  Hypertension.  2.  Cerebral vascular disease.  3.  Status post carotid endarterectomy.  4.  Hyperlipidemia.  5.  Mild aortic stenosis and aortic insufficiency.  6.  Colon polyps.  7.  Internal hemorrhoids.   CURRENT MEDICATIONS:  Listed on the chart, updated, and reviewed.   MEDICATION ALLERGIES:  LIPITOR.   SOCIAL HISTORY AND REVIEW OF SYSTEMS:  Per handwritten form.   PHYSICAL EXAMINATION:  GENERAL:  Well-developed, well-nourished, white male  in no acute distress.  VITALS:  Height 5 feet 8 inches.  Weight 172.4 pounds.  Blood pressure  130/58.  Pulse 70 and irregular.  HEENT:  Anicteric sclerae.  Oropharynx clear.  CHEST:  Clear to auscultation bilaterally.  CARDIAC:  Regular rate and rhythm with a soft systolic murmur at the left  sternal border.  ABDOMEN:  Soft.  Nontender.  Nondistended.  Normoactive bowel sounds.  No  palpable  organomegaly, masses, or hernia.  RECTAL EXAMINATION:  Deferred to time of colonoscopy.  EXTREMITIES:  Without clubbing, cyanosis, or edema.  NEUROLOGIC:  Alert and oriented x3.  Grossly non-focal.   ASSESSMENT AND PLAN:  Personal history of 1.5 cm tubulovillous adenoma and  father with colon cancer at age 15, rule out recurrent colorectal neoplasms.  The risks, benefits, and alternatives to colonoscopy with possible biopsy  and possible polypectomy discussed with the  patient.  He consents to proceed.  This will be scheduled electively.  Hold  aspirin for 7 days prior to the procedure.                                   Venita Lick. Pleas Koch., MD, [facility]   MTS/MedQ  DD:  01/14/2006  DT:  01/14/2006  Job #:  [id number]

## 2010-11-14 NOTE — Letter (Signed)
October 13, 2007     RE:  [name], [name]  MRN:  [id number]  /  DOB:  May 19, 1930   To Whom It May Concern:   Mr. Pete Schnitzer is a patient mine at [employer] in [place],  [place].  He has a history of aortic stenosis, cerebrovascular disease  status post carotid endarterectomy and other risk factors.  He has not  tolerated any statins in the past, due to myalgias.  He has therefore  been treated with Zetia for his hyperlipidemia.  I therefore feel that  this should be covered by his insurance.  Please feel free to call me  with any concerns or questions about this issue.    Sincerely,      Madolyn Frieze. Jens Som, MD, [facility]  Electronically Signed    BSC/MedQ  DD: 10/13/2007  DT: 10/13/2007  Job #: [id number]

## 2010-11-14 NOTE — Assessment & Plan Note (Signed)
[facility]ALTHCARE                              CARDIOLOGY OFFICE NOTE   KAHNER, YANIK                          MRN:          [id number]  DATE:03/24/2006                            DOB:          08/26/1929    Mr. Roots is a pleasant gentleman who has a history of cerebrovascular  disease status post carotid endarterectomy, hypertension, hyperlipidemia and  mild aortic stenosis/insufficiency.  His most recent echocardiogram on  April 02, 2005 showed normal LV function and mild to moderate aortic  stenosis and mild aortic insufficiency.  The mean gradient across the aortic  valve is 21 mmHg. Since he was last seen he denies any chest pain, increased  dyspnea, orthopnea, syncope or pedal edema.   MEDICATIONS:  1. Metoprolol 60 mg p.o. daily.  2. Altace 5 mg p.o. daily.  3. Zetia 10 mg p.o. every other day.  4. Aspirin 325 mg p.o. daily.  5. Eye drops.   PHYSICAL EXAM TODAY:  VITAL SIGNS:  Blood pressure 140/70 and his pulse is  54.  NECK:  Supple. Carotid upstrokes are mildly diminished.  CHEST:  Clear.  CARDIOVASCULAR EXAM:  Shows a regular rate and rhythm. His heart sounds are  distant but there is a 2/6 systolic murmur at the left sternal border.  EXTREMITIES:  Show no edema.  ABDOMINAL EXAM:  Shows no pulsatile masses and no bruits.   His electrocardiogram today shows a sinus rhythm at a rate of 52. The axis  is normal. No significant ST changes are noted. He had repeat carotid  Doppler's on March 22, 2006 which showed 0-39% internal carotid artery  bilaterally.   DISCHARGE DIAGNOSES:  1. History of mild to moderate aortic stenosis.  2. Hypertension.  3. Hyperlipidemia.  4. History of carotid endarterectomy.  5. Intolerance to Statin's.   PLAN:  Mr. Sloop is doing well from a symptomatic stand-point. We will plan  to repeat his echocardiogram to follow up on his aortic stenosis and aortic  insufficiency. He has not tolerated Statin's  in the past and he will  continue on his Zetia. It appears that he is taking regular  Lopressor and have asked him to contact us with the exact medication. If he  is, we will change this to long acting Metoprolol ER. I will otherwise see  him back in 12 months.            ______________________________  Madolyn Frieze. Jens Som, MD, [facility]     BSC/MedQ  DD:  03/24/2006  DT:  03/25/2006  Job #:  [id number]   cc:   Montey Hora, M.D.

## 2010-12-18 ENCOUNTER — Other Ambulatory Visit: Payer: Self-pay | Admitting: *Deleted

## 2010-12-18 MED ORDER — EZETIMIBE 10 MG PO TABS: 10.0000 mg | ORAL_TABLET | Freq: Every day | ORAL | Status: DC

## 2010-12-24 ENCOUNTER — Other Ambulatory Visit: Payer: Self-pay | Admitting: *Deleted

## 2010-12-24 MED ORDER — EZETIMIBE 10 MG PO TABS: 10.0000 mg | ORAL_TABLET | Freq: Every day | ORAL | Status: DC

## 2011-01-01 ENCOUNTER — Encounter: Payer: Self-pay | Admitting: Cardiology

## 2011-01-23 NOTE — Op Note (Signed)
  [name], Gregory NO.:  [id number]  MEDICAL RECORD NO.:  [id number]          PATIENT TYPE:  LOCATION:                                 FACILITY:  PHYSICIAN:  Chucky May, M.D.  DATE OF BIRTH:  April 13, 1930  DATE OF PROCEDURE:  10/01/2010 DATE OF DISCHARGE:                              OPERATIVE REPORT   PREOPERATIVE DIAGNOSIS:  Cataract, OD.  POSTOPERATIVE DIAGNOSIS:  Cataract, OD.  OPERATION PERFORMED:  Cataract extraction with intraocular lens implant, OD.  INDICATIONS FOR SURGERY:  The patient is a 77 year old male with painless progressive decrease in vision, so he has difficulty seeing for reading.  SURGEON:  Chucky May, M.D.  ANESTHESIA:  MAC.  PROCEDURE IN DETAIL:  The patient was taken to the main operating room and placed in the supine position.  Anesthesia was obtained by means of topical 4% lidocaine, drops of tetracaine.  After insertion of the lid speculum, an incision was made in the cornea temporally with a keratome with an additional port superiorly.  Viscoelastic was instilled into the anterior chamber and anterior capsulorrhexis was performed without difficulty.  The nucleus was then mobilized by hydrodissection with 1% nonpreserved lidocaine drops.  Residual cortical material was removed by irrigation and the posterior capsule was polished.  A posterior chamber lens implant, model number [id number], power of 16.5 diopters, was then instilled into the bag without difficulty.  Viscoelastic was removed by irrigation and aspiration and replaced with balanced salt solution.  The wounds were hydrated with balanced salt solution and checked for fluid leaks and none were noted.  The eye was dressed with topical Pred Forte and Vigamox and a Fox shield and the patient was taken to the recovery room in excellent condition where he received written and verbal instructions for his postoperative care and was scheduled for followup in 24  hours.          ______________________________ Chucky May, M.D.     DJD/MEDQ  D:  10/03/2010  T:  10/03/2010  Job:  [id number]  Electronically Signed by Nelson Chimes M.D. on 01/23/2011 09:22:54 AM

## 2011-02-02 ENCOUNTER — Other Ambulatory Visit: Payer: Self-pay | Admitting: Cardiology

## 2011-02-09 MED ORDER — NIACIN ER (ANTIHYPERLIPIDEMIC) 1000 MG PO TBCR: 1000.0000 mg | EXTENDED_RELEASE_TABLET | Freq: Every day | ORAL | Status: DC

## 2011-05-27 ENCOUNTER — Ambulatory Visit (INDEPENDENT_AMBULATORY_CARE_PROVIDER_SITE_OTHER): Payer: Medicare PPO | Admitting: Cardiology

## 2011-05-27 ENCOUNTER — Encounter: Payer: Self-pay | Admitting: Cardiology

## 2011-05-27 DIAGNOSIS — I679 Cerebrovascular disease, unspecified: Secondary | ICD-10-CM

## 2011-05-27 DIAGNOSIS — I359 Nonrheumatic aortic valve disorder, unspecified: Secondary | ICD-10-CM

## 2011-05-27 DIAGNOSIS — I10 Essential (primary) hypertension: Secondary | ICD-10-CM

## 2011-05-27 DIAGNOSIS — E785 Hyperlipidemia, unspecified: Secondary | ICD-10-CM

## 2011-05-27 MED ORDER — NIACIN ER (ANTIHYPERLIPIDEMIC) 1000 MG PO TBCR: 1000.0000 mg | EXTENDED_RELEASE_TABLET | Freq: Every day | ORAL | Status: DC

## 2011-05-27 MED ORDER — EZETIMIBE 10 MG PO TABS: 10.0000 mg | ORAL_TABLET | Freq: Every day | ORAL | Status: DC

## 2011-05-27 MED ORDER — RAMIPRIL 5 MG PO CAPS: 5.0000 mg | ORAL_CAPSULE | Freq: Every day | ORAL | Status: DC

## 2011-05-27 MED ORDER — METOPROLOL SUCCINATE ER 50 MG PO TB24: 50.0000 mg | ORAL_TABLET | Freq: Every day | ORAL | Status: DC

## 2011-05-27 NOTE — Progress Notes (Signed)
HPI:Mr. Ambrose is a pleasant  gentleman who has a history of cerebrovascular disease status post carotid endarterectomy, hypertension, hyperlipidemia, and aortic stenosis. Last echocardiogram in Nov 2011 showed normal LV function and severe AS per JK with a mean gradient of 36 mmHg. There was mild AI. There was mild right atrial  and right ventricular enlargement. Last carotid Dopplers in Oct 2011showed 0-39% bilateral stenosis and F/U recommended in 2 years. I last saw him in Nov 2011. Since then the patient has dyspnea with more extreme activities and not with routine activities. It is relieved with rest. It is not associated with chest pain. There is no orthopnea, PND or pedal edema. There is no syncope or palpitations. There is no exertional chest pain. His symptoms are unchanged compared to previous.  Current Outpatient Prescriptions  Medication Sig Dispense Refill  . aspirin 325 MG EC tablet Take 325 mg by mouth daily.        . dorzolamide (TRUSOPT) 2 % ophthalmic solution Place 1 drop into both eyes 2 (two) times daily.        Marland Kitchen ezetimibe (ZETIA) 10 MG tablet Take 1 tablet (10 mg total) by mouth daily.  90 tablet  3  . latanoprost (XALATAN) 0.005 % ophthalmic solution Place 1 drop into both eyes at bedtime.        . metoprolol (TOPROL-XL) 50 MG 24 hr tablet Take 50 mg by mouth daily.        . niacin (NIASPAN) 1000 MG CR tablet Take 1 tablet (1,000 mg total) by mouth at bedtime.  30 tablet  11  . ramipril (ALTACE) 5 MG capsule Take 5 mg by mouth daily.           Past Medical History  Diagnosis Date  . Cerebrovascular disease, unspecified   . Aortic valve disorders     stenosis  . HLD (hyperlipidemia)   . HTN (hypertension)     Past Surgical History  Procedure Date  . Carotid endarterectomy     History   Social History  . Marital Status: Married    Spouse Name: N/A    Number of Children: N/A  . Years of Education: N/A   Occupational History  . Not on file.   Social History  Main Topics  . Smoking status: Never Smoker   . Smokeless tobacco: Not on file   Comment: tobacco use - no  . Alcohol Use: No  . Drug Use: Not on file  . Sexually Active: Not on file   Other Topics Concern  . Not on file   Social History Narrative   Married.     ROS: no fevers or chills, productive cough, hemoptysis, dysphasia, odynophagia, melena, hematochezia, dysuria, hematuria, rash, seizure activity, orthopnea, PND, pedal edema, claudication. Remaining systems are negative.  Physical Exam: Well-developed well-nourished in no acute distress.  Skin is warm and dry.  HEENT is normal.  Neck is supple. No thyromegaly.  Chest is clear to auscultation with normal expansion.  Cardiovascular exam is regular rate and rhythm. 3/6 systolic murmur Abdominal exam nontender or distended. No masses palpated. Extremities show no edema. neuro grossly intact  ECG a normal sinus rhythm with occasional PACs. Left bundle branch block. One and a

## 2011-05-27 NOTE — Patient Instructions (Signed)
Your physician wants you to follow-up in: 6 MONTHS You will receive a reminder letter in the mail two months in advance. If you don't receive a letter, please call our office to schedule the follow-up appointment.   Your physician has requested that you have an echocardiogram. Echocardiography is a painless test that uses sound waves to create images of your heart. It provides your doctor with information about the size and shape of your heart and how well your heart's chambers and valves are working. This procedure takes approximately one hour. There are no restrictions for this procedure.   

## 2011-05-27 NOTE — Assessment & Plan Note (Signed)
Intolerant to statins. 

## 2011-05-27 NOTE — Assessment & Plan Note (Signed)
Plan followup carotid Dopplers October 2013.

## 2011-05-27 NOTE — Assessment & Plan Note (Signed)
Blood pressure controlled. Continue present medications. 

## 2011-05-27 NOTE — Assessment & Plan Note (Signed)
Patient remains asymptomatic. Plan repeat echocardiogram. I have explained that he will eventually require aortic valve replacement. I have instructed him on the symptoms of aortic stenosis including dyspnea, presyncope and chest pain.

## 2011-06-10 ENCOUNTER — Telehealth: Payer: Self-pay | Admitting: Cardiology

## 2011-06-10 ENCOUNTER — Ambulatory Visit (HOSPITAL_COMMUNITY): Payer: Medicare PPO | Attending: Internal Medicine | Admitting: Radiology

## 2011-06-10 DIAGNOSIS — I10 Essential (primary) hypertension: Secondary | ICD-10-CM | POA: Insufficient documentation

## 2011-06-10 DIAGNOSIS — I359 Nonrheumatic aortic valve disorder, unspecified: Secondary | ICD-10-CM

## 2011-06-10 DIAGNOSIS — Z8673 Personal history of transient ischemic attack (TIA), and cerebral infarction without residual deficits: Secondary | ICD-10-CM | POA: Insufficient documentation

## 2011-06-10 DIAGNOSIS — E785 Hyperlipidemia, unspecified: Secondary | ICD-10-CM | POA: Insufficient documentation

## 2011-06-10 NOTE — Telephone Encounter (Signed)
Error

## 2012-04-12 ENCOUNTER — Other Ambulatory Visit: Payer: Self-pay | Admitting: Cardiology

## 2012-04-12 DIAGNOSIS — I6529 Occlusion and stenosis of unspecified carotid artery: Secondary | ICD-10-CM

## 2012-04-14 ENCOUNTER — Encounter (INDEPENDENT_AMBULATORY_CARE_PROVIDER_SITE_OTHER): Payer: Medicare Other

## 2012-04-14 DIAGNOSIS — I6529 Occlusion and stenosis of unspecified carotid artery: Secondary | ICD-10-CM

## 2012-04-20 ENCOUNTER — Telehealth: Payer: Self-pay | Admitting: *Deleted

## 2012-04-20 NOTE — Telephone Encounter (Signed)
pt notified about carotids w/verbal understanding

## 2012-04-20 NOTE — Telephone Encounter (Signed)
Message copied by Tarri Fuller on Wed Apr 20, 2012 10:45 AM ------      Message from: Lewayne Bunting      Created: Mon Apr 18, 2012  1:49 PM       [name] [place]

## 2012-07-04 ENCOUNTER — Other Ambulatory Visit: Payer: Self-pay | Admitting: Cardiology

## 2012-08-08 ENCOUNTER — Other Ambulatory Visit: Payer: Self-pay | Admitting: Cardiology

## 2012-09-21 ENCOUNTER — Other Ambulatory Visit: Payer: Self-pay | Admitting: Cardiology

## 2013-02-17 ENCOUNTER — Encounter: Payer: Self-pay | Admitting: Gastroenterology

## 2013-05-03 ENCOUNTER — Encounter (INDEPENDENT_AMBULATORY_CARE_PROVIDER_SITE_OTHER): Payer: Self-pay

## 2013-05-03 ENCOUNTER — Ambulatory Visit (INDEPENDENT_AMBULATORY_CARE_PROVIDER_SITE_OTHER): Payer: Medicare Other

## 2013-05-03 DIAGNOSIS — Z23 Encounter for immunization: Secondary | ICD-10-CM

## 2013-06-05 ENCOUNTER — Other Ambulatory Visit: Payer: Self-pay | Admitting: Cardiology

## 2013-07-23 ENCOUNTER — Inpatient Hospital Stay (HOSPITAL_COMMUNITY)
Admission: EM | Admit: 2013-07-23 | Discharge: 2013-07-27 | DRG: 280 | Disposition: A | Discharge status: Home / Self Care | Payer: Medicare Other | Attending: Internal Medicine | Admitting: Internal Medicine

## 2013-07-23 ENCOUNTER — Emergency Department (HOSPITAL_COMMUNITY): Payer: Medicare Other

## 2013-07-23 ENCOUNTER — Encounter (HOSPITAL_COMMUNITY): Payer: Self-pay | Admitting: Emergency Medicine

## 2013-07-23 DIAGNOSIS — I2510 Atherosclerotic heart disease of native coronary artery without angina pectoris: Secondary | ICD-10-CM | POA: Diagnosis present

## 2013-07-23 DIAGNOSIS — Z8673 Personal history of transient ischemic attack (TIA), and cerebral infarction without residual deficits: Secondary | ICD-10-CM

## 2013-07-23 DIAGNOSIS — R209 Unspecified disturbances of skin sensation: Secondary | ICD-10-CM

## 2013-07-23 DIAGNOSIS — J189 Pneumonia, unspecified organism: Secondary | ICD-10-CM

## 2013-07-23 DIAGNOSIS — I679 Cerebrovascular disease, unspecified: Secondary | ICD-10-CM

## 2013-07-23 DIAGNOSIS — R002 Palpitations: Secondary | ICD-10-CM

## 2013-07-23 DIAGNOSIS — I080 Rheumatic disorders of both mitral and aortic valves: Secondary | ICD-10-CM

## 2013-07-23 DIAGNOSIS — Z7982 Long term (current) use of aspirin: Secondary | ICD-10-CM

## 2013-07-23 DIAGNOSIS — I359 Nonrheumatic aortic valve disorder, unspecified: Secondary | ICD-10-CM

## 2013-07-23 DIAGNOSIS — R7989 Other specified abnormal findings of blood chemistry: Secondary | ICD-10-CM

## 2013-07-23 DIAGNOSIS — R778 Other specified abnormalities of plasma proteins: Secondary | ICD-10-CM

## 2013-07-23 DIAGNOSIS — I214 Non-ST elevation (NSTEMI) myocardial infarction: Secondary | ICD-10-CM | POA: Diagnosis present

## 2013-07-23 DIAGNOSIS — I4949 Other premature depolarization: Secondary | ICD-10-CM

## 2013-07-23 DIAGNOSIS — I10 Essential (primary) hypertension: Secondary | ICD-10-CM | POA: Diagnosis present

## 2013-07-23 DIAGNOSIS — J180 Bronchopneumonia, unspecified organism: Secondary | ICD-10-CM | POA: Diagnosis present

## 2013-07-23 DIAGNOSIS — R042 Hemoptysis: Secondary | ICD-10-CM | POA: Diagnosis present

## 2013-07-23 DIAGNOSIS — R0602 Shortness of breath: Secondary | ICD-10-CM

## 2013-07-23 DIAGNOSIS — E785 Hyperlipidemia, unspecified: Secondary | ICD-10-CM | POA: Diagnosis present

## 2013-07-23 DIAGNOSIS — I447 Left bundle-branch block, unspecified: Secondary | ICD-10-CM | POA: Diagnosis present

## 2013-07-23 HISTORY — DX: Pneumonia, unspecified organism: J18.9

## 2013-07-23 LAB — CBC WITH DIFFERENTIAL/PLATELET
Basophils Absolute: 0 10*3/uL (ref 0.0–0.1)
Basophils Relative: 0 % (ref 0–1)
EOS PCT: 1 % (ref 0–5)
Eosinophils Absolute: 0.1 10*3/uL (ref 0.0–0.7)
HEMATOCRIT: 40.3 % (ref 39.0–52.0)
HEMOGLOBIN: 13.8 g/dL (ref 13.0–17.0)
LYMPHS PCT: 20 % (ref 12–46)
Lymphs Abs: 1.5 10*3/uL (ref 0.7–4.0)
MCH: 32.5 pg (ref 26.0–34.0)
MCHC: 34.2 g/dL (ref 30.0–36.0)
MCV: 95 fL (ref 78.0–100.0)
MONO ABS: 0.6 10*3/uL (ref 0.1–1.0)
Monocytes Relative: 8 % (ref 3–12)
NEUTROS ABS: 5.2 10*3/uL (ref 1.7–7.7)
Neutrophils Relative %: 71 % (ref 43–77)
Platelets: 94 10*3/uL — ABNORMAL LOW (ref 150–400)
RBC: 4.24 MIL/uL (ref 4.22–5.81)
RDW: 14.1 % (ref 11.5–15.5)
WBC: 7.3 10*3/uL (ref 4.0–10.5)

## 2013-07-23 LAB — HEPARIN LEVEL (UNFRACTIONATED): HEPARIN UNFRACTIONATED: 0.13 [IU]/mL — AB (ref 0.30–0.70)

## 2013-07-23 LAB — POCT I-STAT, CHEM 8
BUN: 15 mg/dL (ref 6–23)
CHLORIDE: 102 meq/L (ref 96–112)
Calcium, Ion: 1.22 mmol/L (ref 1.13–1.30)
Creatinine, Ser: 0.9 mg/dL (ref 0.50–1.35)
GLUCOSE: 124 mg/dL — AB (ref 70–99)
HCT: 47 % (ref 39.0–52.0)
Hemoglobin: 16 g/dL (ref 13.0–17.0)
Potassium: 3.7 mEq/L (ref 3.7–5.3)
SODIUM: 142 meq/L (ref 137–147)
TCO2: 25 mmol/L (ref 0–100)

## 2013-07-23 LAB — COMPREHENSIVE METABOLIC PANEL
ALBUMIN: 3.3 g/dL — AB (ref 3.5–5.2)
ALK PHOS: 44 U/L (ref 39–117)
ALT: 12 U/L (ref 0–53)
AST: 33 U/L (ref 0–37)
BUN: 12 mg/dL (ref 6–23)
CO2: 25 mEq/L (ref 19–32)
Calcium: 8.5 mg/dL (ref 8.4–10.5)
Chloride: 105 mEq/L (ref 96–112)
Creatinine, Ser: 0.74 mg/dL (ref 0.50–1.35)
GFR calc Af Amer: >90 mL/min (ref >90)
GFR calc non Af Amer: 84 mL/min — ABNORMAL LOW (ref >90)
Glucose, Bld: 103 mg/dL — ABNORMAL HIGH (ref 70–99)
POTASSIUM: 4.1 meq/L (ref 3.7–5.3)
SODIUM: 144 meq/L (ref 137–147)
TOTAL PROTEIN: 6 g/dL (ref 6.0–8.3)
Total Bilirubin: 0.7 mg/dL (ref 0.3–1.2)

## 2013-07-23 LAB — POCT I-STAT TROPONIN I: TROPONIN I, POC: 0.29 ng/mL — AB (ref 0.00–0.08)

## 2013-07-23 LAB — CBC
HEMATOCRIT: 44.3 % (ref 39.0–52.0)
HEMOGLOBIN: 15.2 g/dL (ref 13.0–17.0)
MCH: 32.5 pg (ref 26.0–34.0)
MCHC: 34.3 g/dL (ref 30.0–36.0)
MCV: 94.9 fL (ref 78.0–100.0)
Platelets: 109 10*3/uL — ABNORMAL LOW (ref 150–400)
RBC: 4.67 MIL/uL (ref 4.22–5.81)
RDW: 13.9 % (ref 11.5–15.5)
WBC: 10.2 10*3/uL (ref 4.0–10.5)

## 2013-07-23 LAB — TROPONIN I
TROPONIN I: 0.72 ng/mL — AB (ref <0.30)
TROPONIN I: 3.19 ng/mL — AB (ref <0.30)
Troponin I: 3.91 ng/mL (ref <0.30)
Troponin I: 3.15 ng/mL (ref <0.30)

## 2013-07-23 LAB — TSH: TSH: 6.809 u[IU]/mL — ABNORMAL HIGH (ref 0.350–4.500)

## 2013-07-23 MED ORDER — HEPARIN BOLUS VIA INFUSION
3500.0000 [IU] | Freq: Once | INTRAVENOUS | Status: DC
  Filled 2013-07-23: qty 3500

## 2013-07-23 MED ORDER — DORZOLAMIDE HCL 2 % OP SOLN
1.0000 [drp] | Freq: Two times a day (BID) | OPHTHALMIC | Status: DC
  Administered 2013-07-23 – 2013-07-27 (×8): 1 [drp] via OPHTHALMIC
  Filled 2013-07-23: qty 10

## 2013-07-23 MED ORDER — ONDANSETRON HCL 4 MG PO TABS: 4.0000 mg | ORAL_TABLET | Freq: Four times a day (QID) | ORAL | Status: DC | PRN

## 2013-07-23 MED ORDER — LEVOFLOXACIN IN D5W 750 MG/150ML IV SOLN
750.0000 mg | Freq: Once | INTRAVENOUS | Status: AC
  Administered 2013-07-23: 750 mg via INTRAVENOUS
  Filled 2013-07-23: qty 150

## 2013-07-23 MED ORDER — GUAIFENESIN ER 600 MG PO TB12
600.0000 mg | ORAL_TABLET | Freq: Two times a day (BID) | ORAL | Status: DC
  Administered 2013-07-23 – 2013-07-27 (×9): 600 mg via ORAL
  Filled 2013-07-23 (×10): qty 1

## 2013-07-23 MED ORDER — HEPARIN (PORCINE) IN NACL 100-0.45 UNIT/ML-% IJ SOLN
1150.0000 [IU]/h | INTRAMUSCULAR | Status: DC
  Administered 2013-07-23: 900 [IU]/h via INTRAVENOUS
  Administered 2013-07-24 – 2013-07-25 (×2): 1150 [IU]/h via INTRAVENOUS
  Filled 2013-07-23 (×4): qty 250

## 2013-07-23 MED ORDER — RAMIPRIL 5 MG PO CAPS
5.0000 mg | ORAL_CAPSULE | Freq: Every day | ORAL | Status: DC
  Administered 2013-07-23 – 2013-07-27 (×5): 5 mg via ORAL
  Filled 2013-07-23 (×5): qty 1

## 2013-07-23 MED ORDER — LEVOFLOXACIN IN D5W 750 MG/150ML IV SOLN
750.0000 mg | INTRAVENOUS | Status: DC
  Administered 2013-07-23 – 2013-07-24 (×2): 750 mg via INTRAVENOUS
  Filled 2013-07-23 (×3): qty 150

## 2013-07-23 MED ORDER — GUAIFENESIN 100 MG/5ML PO SYRP
200.0000 mg | ORAL_SOLUTION | Freq: Three times a day (TID) | ORAL | Status: DC | PRN
  Filled 2013-07-23: qty 10

## 2013-07-23 MED ORDER — EZETIMIBE 10 MG PO TABS
10.0000 mg | ORAL_TABLET | Freq: Every day | ORAL | Status: DC
  Administered 2013-07-23 – 2013-07-27 (×5): 10 mg via ORAL
  Filled 2013-07-23 (×5): qty 1

## 2013-07-23 MED ORDER — ONDANSETRON HCL 4 MG/2ML IJ SOLN: 4.0000 mg | Freq: Four times a day (QID) | INTRAMUSCULAR | Status: DC | PRN

## 2013-07-23 MED ORDER — ASPIRIN 81 MG PO CHEW
324.0000 mg | CHEWABLE_TABLET | Freq: Once | ORAL | Status: AC
  Administered 2013-07-23: 324 mg via ORAL
  Filled 2013-07-23: qty 4

## 2013-07-23 MED ORDER — ASPIRIN EC 325 MG PO TBEC: 325.0000 mg | DELAYED_RELEASE_TABLET | Freq: Every day | ORAL | Status: DC

## 2013-07-23 MED ORDER — PNEUMOCOCCAL VAC POLYVALENT 25 MCG/0.5ML IJ INJ
0.5000 mL | INJECTION | INTRAMUSCULAR | Status: AC
  Administered 2013-07-27: 0.5 mL via INTRAMUSCULAR
  Filled 2013-07-23 (×2): qty 0.5

## 2013-07-23 MED ORDER — HEPARIN (PORCINE) IN NACL 100-0.45 UNIT/ML-% IJ SOLN
1000.0000 [IU]/h | INTRAMUSCULAR | Status: DC
  Filled 2013-07-23: qty 250

## 2013-07-23 MED ORDER — NIACIN ER (ANTIHYPERLIPIDEMIC) 500 MG PO TBCR
1000.0000 mg | EXTENDED_RELEASE_TABLET | Freq: Every day | ORAL | Status: DC
  Administered 2013-07-23 – 2013-07-26 (×4): 1000 mg via ORAL
  Filled 2013-07-23 (×5): qty 2

## 2013-07-23 MED ORDER — ACETAMINOPHEN 325 MG PO TABS: 650.0000 mg | ORAL_TABLET | Freq: Four times a day (QID) | ORAL | Status: DC | PRN

## 2013-07-23 MED ORDER — LATANOPROST 0.005 % OP SOLN
1.0000 [drp] | Freq: Every day | OPHTHALMIC | Status: DC
  Administered 2013-07-24 – 2013-07-26 (×3): 1 [drp] via OPHTHALMIC
  Filled 2013-07-23 (×2): qty 2.5

## 2013-07-23 MED ORDER — METOPROLOL SUCCINATE ER 50 MG PO TB24
50.0000 mg | ORAL_TABLET | Freq: Every day | ORAL | Status: DC
  Administered 2013-07-23 – 2013-07-27 (×5): 50 mg via ORAL
  Filled 2013-07-23 (×5): qty 1

## 2013-07-23 MED ORDER — ACETAMINOPHEN 650 MG RE SUPP: 650.0000 mg | Freq: Four times a day (QID) | RECTAL | Status: DC | PRN

## 2013-07-23 MED ORDER — SODIUM CHLORIDE 0.9 % IJ SOLN
3.0000 mL | Freq: Two times a day (BID) | INTRAMUSCULAR | Status: DC
  Administered 2013-07-24: 3 mL via INTRAVENOUS

## 2013-07-23 MED ORDER — IOHEXOL 350 MG/ML SOLN
100.0000 mL | Freq: Once | INTRAVENOUS | Status: AC | PRN
  Administered 2013-07-23: 100 mL via INTRAVENOUS

## 2013-07-23 MED ORDER — SODIUM CHLORIDE 0.9 % IJ SOLN
3.0000 mL | Freq: Two times a day (BID) | INTRAMUSCULAR | Status: DC
  Administered 2013-07-23 – 2013-07-25 (×3): 3 mL via INTRAVENOUS

## 2013-07-23 MED ORDER — ASPIRIN EC 325 MG PO TBEC
325.0000 mg | DELAYED_RELEASE_TABLET | Freq: Every day | ORAL | Status: DC
  Administered 2013-07-25 – 2013-07-27 (×3): 325 mg via ORAL
  Filled 2013-07-23 (×3): qty 1

## 2013-07-23 MED ORDER — NITROGLYCERIN 0.4 MG SL SUBL
0.4000 mg | SUBLINGUAL_TABLET | SUBLINGUAL | Status: DC | PRN
  Administered 2013-07-23: 0.4 mg via SUBLINGUAL
  Filled 2013-07-23: qty 25

## 2013-07-23 NOTE — Progress Notes (Signed)
ANTICOAGULATION CONSULT NOTE - Initial Consult  Pharmacy Consult for heparin Indication: chest pain/ACS  No Known Allergies  Patient Measurements: Height: 5\' 8"  (172.7 cm) Weight: 144 lb 12.8 oz (65.681 kg) IBW/kg (Calculated) : 68.4 Heparin Dosing Weight: 65kg  Vital Signs: Temp: 98.6 F (37 C) (01/25 1029) Temp src: Oral (01/25 1029) BP: 159/65 mmHg (01/25 1029) Pulse Rate: 82 (01/25 0935)  Labs:  Recent Labs  07/23/13 0340 07/23/13 0401 07/23/13 1130  HGB 15.2 16.0 13.8  HCT 44.3 47.0 40.3  PLT 109*  --  94*  CREATININE  --  0.90 0.74  TROPONINI 0.72*  --  3.91*    Estimated Creatinine Clearance: 67.3 ml/min (by C-G formula based on Cr of 0.74).   Medical History: Past Medical History  Diagnosis Date  . Cerebrovascular disease, unspecified   . Aortic valve disorders     stenosis  . HLD (hyperlipidemia)   . HTN (hypertension)     Medications:  Prescriptions prior to admission  Medication Sig Dispense Refill  . aspirin 325 MG EC tablet Take 325 mg by mouth daily.        Marland Kitchen DM-Phenylephrine-Acetaminophen (ALKA-SELTZER PLS SINUS & COUGH) 10-5-325 MG CAPS Take 1 packet by mouth every 6 (six) hours as needed (cold/sinus).       . dorzolamide (TRUSOPT) 2 % ophthalmic solution Place 1 drop into both eyes 2 (two) times daily.        Marland Kitchen guaiFENesin (MUCINEX) 600 MG 12 hr tablet Take 600 mg by mouth 2 (two) times daily.      Marland Kitchen guaifenesin (ROBITUSSIN) 100 MG/5ML syrup Take 200 mg by mouth 3 (three) times daily as needed for cough.      . latanoprost (XALATAN) 0.005 % ophthalmic solution Place 1 drop into both eyes at bedtime.        . metoprolol succinate (TOPROL-XL) 50 MG 24 hr tablet TAKE 1 TABLET EVERY DAY  90 tablet  3  . niacin (NIASPAN) 1000 MG CR tablet TAKE 1 TABLET BY MOUTH AT BEDTIME  90 tablet  3  . ramipril (ALTACE) 5 MG capsule TAKE ONE CAPSULE BY MOUTH EVERY DAY  90 capsule  3  . ZETIA 10 MG tablet TAKE 1 TABLET EVERY DAY  90 tablet  3     Assessment: 80 year old man with NSTEMI, troponin 3.91, to start on heparin.  Patient had small amount of hemoptysis this morning.  Discussed with Dr. Broadus Deantae who feels the benefit of heparin outweighs the risk of bleeding due elevated and rising troponin values.  She asked that we do not give a bolus dose and shoot for the lower end of the goal range. Goal of Therapy:  Heparin level 0.3-0.5 Monitor platelets by anticoagulation protocol: Yes   Plan:  Start heparin infusion at 900 units/hr Check anti-Xa level in 8 hours and daily while on heparin Continue to monitor H&H and platelets Monitor for bleeding.  Candie Mile 07/23/2013,1:14 PM

## 2013-07-23 NOTE — ED Notes (Signed)
I have attempted to phone report to [facility] [place].  They state they have reassigned the bed and will call for report when able.  Pt. Remains awake, alert and in no distress.  We show his wife to our cafeteria at this time.

## 2013-07-23 NOTE — ED Notes (Signed)
I have just phoned report to [place] at [facility] at [facility].  Pt. And his wife agree with plan to transfer there.

## 2013-07-23 NOTE — ED Notes (Signed)
He remains awake, alert and in no distress.  His skin is normal, warm and dry and he is breathing normally.  Monitor shows nsr with LBBB.

## 2013-07-23 NOTE — Progress Notes (Signed)
ANTICOAGULATION CONSULT NOTE  Pharmacy Consult for heparin Indication: chest pain/ACS  No Known Allergies  Patient Measurements: Height: 5\' 8"  (172.7 cm) Weight: 144 lb 12.8 oz (65.681 kg) IBW/kg (Calculated) : 68.4 Heparin Dosing Weight: 65kg  Vital Signs: Temp: 99.4 F (37.4 C) (01/25 2000) BP: 121/62 mmHg (01/25 2000) Pulse Rate: 77 (01/25 2000)  Labs:  Recent Labs  07/23/13 0340 07/23/13 0401 07/23/13 1130 07/23/13 1639 07/23/13 2218  HGB 15.2 16.0 13.8  --   --   HCT 44.3 47.0 40.3  --   --   PLT 109*  --  94*  --   --   HEPARINUNFRC  --   --   --   --  0.13*  CREATININE  --  0.90 0.74  --   --   TROPONINI 0.72*  --  3.91* 3.19* 3.15*    Estimated Creatinine Clearance: 67.3 ml/min (by C-G formula based on Cr of 0.74). Assessment: 80 year old male with NSTEMI for heparin  Goal of Therapy:  Heparin level 0.3-0.5 Monitor platelets by anticoagulation protocol: Yes   Plan:  Increase Heparin 1050 units/hr Follow-up am labs.  Caryl Pina 07/23/2013,11:28 PM

## 2013-07-23 NOTE — Progress Notes (Signed)
CRITICAL VALUE ALERT  Critical value received:  Troponin 3.91  Date of notification:  07/23/2013  Time of notification:  2633  Critical value read back:yes  Nurse who received alert:  Deberah Castle   MD notified (1st page):  Dr. Broadus Jovonta  Time of first page:  1247  MD notified (2nd page):  Time of second page:  Responding MD:  Dr. Broadus Warden  Time MD responded:  1250

## 2013-07-23 NOTE — ED Provider Notes (Signed)
CSN: [id number]     Arrival date & time 07/23/13  0257 History   First MD Initiated Contact with Patient 07/23/13 [phone]     Chief Complaint  Patient presents with  . Chest Pain  . Shortness of Breath  . Hemoptysis   (Consider location/radiation/quality/duration/timing/severity/associated sxs/prior Treatment) HPI History provided by patient and his wife. Has been sick at home the last few days with cough and cold. No fevers. Tonight developed coughing spell followed by some hemoptysis with bright red blood.  He was complaining of difficulty breathing and called EMS. By the time EMS arrived he was no longer having any difficulty breathing. He was having some sharp chest pains with coughing but this has also resolved. No sick contacts. No back pain. No neck pain. Takes aspirin, o/w No blood thinners. Symptoms moderate in severity.  Past Medical History  Diagnosis Date  . Cerebrovascular disease, unspecified   . Aortic valve disorders     stenosis  . HLD (hyperlipidemia)   . HTN (hypertension)    Past Surgical History  Procedure Laterality Date  . Carotid endarterectomy     Family History  Problem Relation Age of Onset  . Colon cancer Father   . Heart failure Mother     CHF    History  Substance Use Topics  . Smoking status: Never Smoker   . Smokeless tobacco: Not on file     Comment: tobacco use - no  . Alcohol Use: No    Review of Systems  Constitutional: Negative for fever and chills.  Respiratory: Positive for cough and shortness of breath.   Cardiovascular: Positive for chest pain.  Gastrointestinal: Negative for abdominal pain.  Genitourinary: Negative for dysuria.  Musculoskeletal: Negative for back pain, neck pain and neck stiffness.  Skin: Negative for rash.  Neurological: Negative for headaches.  All other systems reviewed and are negative.    Allergies  Review of patient's allergies indicates no known allergies.  Home Medications   Current Outpatient Rx   Name  Route  Sig  Dispense  Refill  . aspirin 325 MG EC tablet   Oral   Take 325 mg by mouth daily.           Marland Kitchen DM-Phenylephrine-Acetaminophen (ALKA-SELTZER PLS SINUS & COUGH) 10-5-325 MG CAPS   Oral   Take 1 packet by mouth every 6 (six) hours as needed (cold/sinus).          . dorzolamide (TRUSOPT) 2 % ophthalmic solution   Both Eyes   Place 1 drop into both eyes 2 (two) times daily.           Marland Kitchen guaiFENesin (MUCINEX) 600 MG 12 hr tablet   Oral   Take 600 mg by mouth 2 (two) times daily.         Marland Kitchen guaifenesin (ROBITUSSIN) 100 MG/5ML syrup   Oral   Take 200 mg by mouth 3 (three) times daily as needed for cough.         . latanoprost (XALATAN) 0.005 % ophthalmic solution   Both Eyes   Place 1 drop into both eyes at bedtime.           . metoprolol succinate (TOPROL-XL) 50 MG 24 hr tablet      TAKE 1 TABLET EVERY DAY   90 tablet   3   . niacin (NIASPAN) 1000 MG CR tablet      TAKE 1 TABLET BY MOUTH AT BEDTIME   90 tablet   3   .  ramipril (ALTACE) 5 MG capsule      TAKE ONE CAPSULE BY MOUTH EVERY DAY   90 capsule   3   . ZETIA 10 MG tablet      TAKE 1 TABLET EVERY DAY   90 tablet   3    BP 181/70  Pulse 70  Temp(Src) 98.8 F (37.1 C) (Oral)  Resp 18  SpO2 95% Physical Exam  Constitutional: He is oriented to person, place, and time. He appears well-developed and well-nourished.  HENT:  Head: Normocephalic and atraumatic.  Mouth/Throat: Oropharynx is clear and moist.  Eyes: EOM are normal. Pupils are equal, round, and reactive to light.  Neck: Neck supple.  Cardiovascular: Normal rate, regular rhythm and intact distal pulses.   Pulmonary/Chest: Effort normal. No respiratory distress.  Mildly decreased bilateral breath sounds without wheezes, rhonchi or rales  Abdominal: Soft. He exhibits no distension. There is no tenderness.  Musculoskeletal: Normal range of motion. He exhibits no edema.  Neurological: He is alert and oriented to person,  place, and time.  Skin: Skin is warm and dry.    ED Course  Procedures (including critical care time) Labs Review Labs Reviewed  CBC - Abnormal; Notable for the following:    Platelets 109 (*)    All other components within normal limits  TROPONIN I - Abnormal; Notable for the following:    Troponin I 0.72 (*)    All other components within normal limits  POCT I-STAT, CHEM 8 - Abnormal; Notable for the following:    Glucose, Bld 124 (*)    All other components within normal limits  POCT I-STAT TROPONIN I - Abnormal; Notable for the following:    Troponin i, poc 0.29 (*)    All other components within normal limits   Imaging Review Ct Angio Chest Pe W/cm &/or Wo Cm  07/23/2013   CLINICAL DATA:  Shortness of breath and hemoptysis.  EXAM: CT ANGIOGRAPHY CHEST WITH CONTRAST  TECHNIQUE: Multidetector CT imaging of the chest was performed using the standard protocol during bolus administration of intravenous contrast. Multiplanar CT image reconstructions including MIPs were obtained to evaluate the vascular anatomy.  CONTRAST:  171mL OMNIPAQUE IOHEXOL 350 MG/ML SOLN  COMPARISON:  Chest radiograph July 23, 2013 at 400 hr and chest radiograph report April 19, 1999 though images are not available for direct comparison.  FINDINGS: Adequate bolus timing. Main pulmonary artery is not enlarged. No pulmonary arterial filling defects to the level of the subsegmental branches.  Heart is mildly enlarged, small pericardial effusion. Thoracic aorta is normal in course and caliber with mild calcific atherosclerosis.  Markedly elevated left hemidiaphragm (described on prior chest radiograph April 19, 1999), with suspected wide neck hernia containing the majority of the stomach as well as the colonic splenic flexure.  Patchy ground-glass opacities with centrilobular nodules in the right upper lobe, per predominately in the apical and posterior segments, with similar findings in the right lower lobe and to  lesser extent right middle lobe. Minimal interstitial prominence and atelectasis in left lung base. Small right pleural effusions. Tracheobronchial tree is patent, with trace pericardial wall thickening. No pneumothorax.  1 cm cyst in the hepatic dome. Soft tissues are nonsuspicious. 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  Review of the MIP images confirms the above findings. Stomach and large bowel within the hernia sac without bowel obstruction.  IMPRESSION: No pulmonary embolism.  Mild bronchial wall thickening in addition to airspace opacities throughout the right lung highly concerning  for bronchopneumonia with small right pleural effusion. Recommend followup imaging to verify improvement after treatment.  Markedly elevated left hemidiaphragm with suspected chronic hernia, no bowel obstruction in the included view.  Preliminary findings discussed with and reconfirmed by Dr. Marnette [name] on July 23, 2013 at approximately 0620 hr.   Electronically Signed   By: Elon Alas   On: 07/23/2013 06:30   Dg Chest Portable 1 View  07/23/2013   CLINICAL DATA:  Chest pain and shortness of breath.  Hemoptysis.  EXAM: PORTABLE CHEST - 1 VIEW  COMPARISON:  None.  FINDINGS: Diffuse right-sided airspace opacification is seen. Dense left basilar airspace opacification is noted. There is elevation of the left hemidiaphragm. No definite pleural effusion or pneumothorax is seen.  The cardiomediastinal silhouette is mildly enlarged. No acute osseous abnormalities are identified.  IMPRESSION: 1. Diffuse right-sided airspace opacification and dense left basilar airspace opacity. Though this could reflect multifocal pneumonia, diffuse right-sided alveolar hemorrhage could have a similar appearance, given the patient's hemoptysis. Would correlate with the extent of the patient's symptoms. 2. Mild cardiomegaly.   Electronically Signed   By: Garald Balding M.D.   On: 07/23/2013 04:44    EKG Interpretation     Date/Time:  Sunday July 23 2013 03:18:45 EST Ventricular Rate:  68 PR Interval:  165 QRS Duration: 153 QT Interval:  472 QTC Calculation: 502 R Axis:   44 Text Interpretation:  Sinus rhythm Multiple premature complexes, vent  Left bundle branch block Abnormal ECG Confirmed by Rojean Ige  MD, Jazmyn Offner ([id number]) on 07/23/2013 3:27:45 AM           Aspirin provided. CT scan obtained to evaluate for possible hemorrhage, reviewed as above and heparin initiated. CT results discussed with radiologist, no acute abnormality left diaphragm. Antibiotics provided  6:43 AM discussed with cardiology on call, plan transfer and admit [facility] 6:43 AM discussed with hospitalist, will arrange for medical admission  MDM  Diagnosis: Require pneumonia, elevated troponin  Evaluate EKG, old left bundle-branch block, some ST depressions Chest x-ray concerning for pneumonia versus hemorrhage and CT scan obtained - treated for CAP Serial evaluations - patient remains asymptomatic in the emergency department MED admit, CAR consult   Teressa Lower, MD 07/23/13 [phone]

## 2013-07-23 NOTE — Consult Note (Signed)
Admit date: 07/23/2013 Referring Physician  Dr. Broadus Chucky Primary Physician Redge Gainer, MD Primary Cardiologist  Dr. Stanford Breed Reason for Consultation  elevated troponin, severe aortic stenosis  HPI: 80 year old male with severe aortic stenosis from echocardiogram from 2012 with chronic left bundle branch block who presented with increasing shortness of breath and hemoptysis. In late December, began having a productive cough and initially had fevers, flulike symptoms. On 07/22/13 became worse with chest tightness associated with cough as well as hemoptysis. Initial x-rays were concerning for alveolar hemorrhage. He does have features of bronchopneumonia. Troponins were initially elevated at 0.7 and are now further elevated at 3.91. Prior to December, he stated that he was walking without any difficulty, no dyspnea.  No prior stroke although he has had right carotid endarterectomy. No prior myocardial infarction. No palpitations, no syncope.  According to our records, he has not seen Dr. Stanford Breed since December of 2012.    PMH:   Past Medical History  Diagnosis Date  . Cerebrovascular disease, unspecified   . Aortic valve disorders     stenosis  . HLD (hyperlipidemia)   . HTN (hypertension)     PSH:   Past Surgical History  Procedure Laterality Date  . Carotid endarterectomy     Allergies:  Review of patient's allergies indicates no known allergies. Prior to Admit Meds:   Prior to Admission medications   Medication Sig Start Date End Date Taking? Authorizing Provider  aspirin 325 MG EC tablet Take 325 mg by mouth daily.     Yes Historical Provider, MD  DM-Phenylephrine-Acetaminophen (ALKA-SELTZER PLS SINUS & COUGH) 10-5-325 MG CAPS Take 1 packet by mouth every 6 (six) hours as needed (cold/sinus).    Yes Historical Provider, MD  dorzolamide (TRUSOPT) 2 % ophthalmic solution Place 1 drop into both eyes 2 (two) times daily.     Yes Historical Provider, MD  guaiFENesin (MUCINEX) 600  MG 12 hr tablet Take 600 mg by mouth 2 (two) times daily.   Yes Historical Provider, MD  guaifenesin (ROBITUSSIN) 100 MG/5ML syrup Take 200 mg by mouth 3 (three) times daily as needed for cough.   Yes Historical Provider, MD  latanoprost (XALATAN) 0.005 % ophthalmic solution Place 1 drop into both eyes at bedtime.     Yes Historical Provider, MD  metoprolol succinate (TOPROL-XL) 50 MG 24 hr tablet TAKE 1 TABLET EVERY DAY 06/05/13  Yes Lelon Perla, MD  niacin (NIASPAN) 1000 MG CR tablet TAKE 1 TABLET BY MOUTH AT BEDTIME 09/21/12  Yes Lelon Perla, MD  ramipril (ALTACE) 5 MG capsule TAKE ONE CAPSULE BY MOUTH EVERY DAY 06/05/13  Yes Lelon Perla, MD  ZETIA 10 MG tablet TAKE 1 TABLET EVERY DAY 06/05/13  Yes Lelon Perla, MD   Fam HX:    Family History  Problem Relation Age of Onset  . Colon cancer Father   . Heart failure Mother     CHF    Social HX:    History   Social History  . Marital Status: Married    Spouse Name: N/A    Number of Children: N/A  . Years of Education: N/A   Occupational History  . Not on file.   Social History Main Topics  . Smoking status: Never Smoker   . Smokeless tobacco: Not on file     Comment: tobacco use - no  . Alcohol Use: No  . Drug Use: Not on file  . Sexual Activity: Not on file  Other Topics Concern  . Not on file   Social History Narrative   Married.      ROS:  All 11 ROS were addressed and are negative except what is stated in the HPI. Denies orthopnea, syncope. Positive for dyspnea, chest discomfort, hemoptysis   Physical Exam: Blood pressure 159/65, pulse 82, temperature 98.6 F (37 C), temperature source Oral, resp. rate 24, height 5\' 8"  (1.727 m), weight 144 lb 12.8 oz (65.681 kg), SpO2 100.00%.   General: Well developed, well nourished, elderly, in no acute distress Head: Eyes PERRLA, No xanthomas.   Normal cephalic and atramatic  Lungs:   Clear bilaterally to auscultation and percussion. Normal respiratory  effort. No wheezes, no rales. Heart:   HRRR S1 S2 Pulses are 2+ & equal. 3/6 systolic murmur right upper sternal border, rubs, gallops.  Prior right carotid endarterectomy. Radiation of murmur to carotids. No JVD.  No abdominal bruits.  Abdomen: Bowel sounds are positive, abdomen soft and non-tender without masses. No hepatosplenomegaly. Msk:  Back normal. Normal strength and tone for age. Extremities:  No clubbing, cyanosis or edema.  DP +1 Neuro: Alert and oriented X 3, non-focal, MAE x 4 GU: Deferred Rectal: Deferred Psych:  Good affect, responds appropriately      Labs: Lab Results  Component Value Date   WBC 7.3 07/23/2013   HGB 13.8 07/23/2013   HCT 40.3 07/23/2013   MCV 95.0 07/23/2013   PLT 94* 07/23/2013     Recent Labs Lab 07/23/13 1130  NA 144  K 4.1  CL 105  CO2 25  BUN 12  CREATININE 0.74  CALCIUM 8.5  PROT 6.0  BILITOT 0.7  ALKPHOS 44  ALT 12  AST 33  GLUCOSE 103*    Recent Labs  07/23/13 0340 07/23/13 1130  TROPONINI 0.72* 3.91*   Lab Results  Component Value Date   CHOL 121 05/16/2010   HDL 42.30 05/16/2010   LDLCALC 71 05/16/2010   TRIG 41.0 05/16/2010   No results found for this basename: DDIMER     Radiology:  Ct Angio Chest Pe W/cm &/or Wo Cm  07/23/2013   CLINICAL DATA:  Shortness of breath and hemoptysis.  EXAM: CT ANGIOGRAPHY CHEST WITH CONTRAST  TECHNIQUE: Multidetector CT imaging of the chest was performed using the standard protocol during bolus administration of intravenous contrast. Multiplanar CT image reconstructions including MIPs were obtained to evaluate the vascular anatomy.  CONTRAST:  174mL OMNIPAQUE IOHEXOL 350 MG/ML SOLN  COMPARISON:  Chest radiograph July 23, 2013 at 400 hr and chest radiograph report April 19, 1999 though images are not available for direct comparison.  FINDINGS: Adequate bolus timing. Main pulmonary artery is not enlarged. No pulmonary arterial filling defects to the level of the subsegmental branches.   Heart is mildly enlarged, small pericardial effusion. Thoracic aorta is normal in course and caliber with mild calcific atherosclerosis.  Markedly elevated left hemidiaphragm (described on prior chest radiograph April 19, 1999), with suspected wide neck hernia containing the majority of the stomach as well as the colonic splenic flexure.  Patchy ground-glass opacities with centrilobular nodules in the right upper lobe, per predominately in the apical and posterior segments, with similar findings in the right lower lobe and to lesser extent right middle lobe. Minimal interstitial prominence and atelectasis in left lung base. Small right pleural effusions. Tracheobronchial tree is patent, with trace pericardial wall thickening. No pneumothorax.  1 cm cyst in the hepatic dome. Soft tissues are nonsuspicious. T7-8 butterfly vertebra results in  accentuated thoracic kyphosis.  Review of the MIP images confirms the above findings. Stomach and large bowel within the hernia sac without bowel obstruction.  IMPRESSION: No pulmonary embolism.  Mild bronchial wall thickening in addition to airspace opacities throughout the right lung highly concerning for bronchopneumonia with small right pleural effusion. Recommend followup imaging to verify improvement after treatment.  Markedly elevated left hemidiaphragm with suspected chronic hernia, no bowel obstruction in the included view.  Preliminary findings discussed with and reconfirmed by Dr. Marnette [name] on July 23, 2013 at approximately 0620 hr.   Electronically Signed   By: Elon Alas   On: 07/23/2013 06:30   Dg Chest Portable 1 View  07/23/2013   CLINICAL DATA:  Chest pain and shortness of breath.  Hemoptysis.  EXAM: PORTABLE CHEST - 1 VIEW  COMPARISON:  None.  FINDINGS: Diffuse right-sided airspace opacification is seen. Dense left basilar airspace opacification is noted. There is elevation of the left hemidiaphragm. No definite pleural effusion or pneumothorax is  seen.  The cardiomediastinal silhouette is mildly enlarged. No acute osseous abnormalities are identified.  IMPRESSION: 1. Diffuse right-sided airspace opacification and dense left basilar airspace opacity. Though this could reflect multifocal pneumonia, diffuse right-sided alveolar hemorrhage could have a similar appearance, given the patient's hemoptysis. Would correlate with the extent of the patient's symptoms. 2. Mild cardiomegaly.   Electronically Signed   By: Garald Balding M.D.   On: 07/23/2013 04:44   Personally viewed.  EKG:  Sinus rhythm, left bundle branch block, PAC. Personally viewed.  Echocardiogram 06/10/11: EF 50-55%, severe aortic stenosis with mean gradient of 40 mm mercury  ASSESSMENT/PLAN:   80 year old male here aortic stenosis, hemoptysis, multifocal pneumonia/alveolar hemorrhage with elevated troponin from 0.72 to 3.91 consistent with non-ST elevation myocardial infarction type II.  1. Non-ST elevation myocardial infarction type II-demand ischemia in the setting of respiratory illness and underlying severe aortic stenosis. Chronic left bundle branch block. Continue with aspirin, metoprolol. Currently no longer having hemoptysis. IV heparin, ACS protocol was started. Please be very cautious and monitor for any worsening signs of alveolar hemorrhage.   2. Severe aortic stenosis-I will repeat echocardiogram since it has been since 2012. Evaluate any wall motion changes. Decreases in EF. Evaluate aortic valve once again. Explained to he and his wife that surgery may be eminent.  3. Hemoptysis/alveolar hemorrhage/pneumonia-per primary team. Antibiotics.  Candee Furbish, MD  07/23/2013  2:48 PM

## 2013-07-23 NOTE — ED Notes (Signed)
Pt is from home arrives via EMS  Coughing up bright red blood,  Sob and chest pain,

## 2013-07-23 NOTE — H&P (Signed)
[facility] History and Physical  [name]:[id number] DOB: February 04, 1930 DOA: 07/23/2013  Referring physician: ER physician. PCP: Gregory Gainer, MD  Specialists: Dr. Stanford Breed. Cardiologist.  Chief Complaint: Shortness of breath.  HPI: [name] is a 80 y.o. male history of severe aortic stenosis, hypertension, hyperlipidemia and previous history of stroke presented to the ER because of sudden onset of shortness of breath with hemoptysis. Patient states that since December 30 24 the patient has been having productive cough and initially had fever chills and flulike symptoms but patient was only taking some home remedies. Patient's cough was persistent but last night became worse with some chest tightness and hemoptysis. In the ER initial x-rays were concerning for alveolar hemorrhage. CT chest shows features concerning for bronchopneumonia. Patient's troponin was positive and EKG were showing LBBB which patient has known history of LBBB. On-call cardiologist was consulted. At this time patient will be transferred [facility] as there is no is at [facility]. Patient presently is not in acute distress. Denies any nausea vomiting abdominal pain. Denies any recent travel or sick contacts.   Review of Systems: As presented in the history of presenting illness, rest negative.  Past Medical History  Diagnosis Date  . Cerebrovascular disease, unspecified   . Aortic valve disorders     stenosis  . HLD (hyperlipidemia)   . HTN (hypertension)    Past Surgical History  Procedure Laterality Date  . Carotid endarterectomy     Social History:  reports that he has never smoked. He does not have any smokeless tobacco history on file. He reports that he does not drink alcohol. His drug history is not on file. Where does patient live home. Can patient participate in ADLs? Yes.  No Known Allergies  Family History:  Family History  Problem Relation Age of Onset  . Colon cancer Father    . Heart failure Mother     CHF       Prior to Admission medications   Medication Sig Start Date End Date Taking? Authorizing Provider  aspirin 325 MG EC tablet Take 325 mg by mouth daily.     Yes Historical Provider, MD  DM-Phenylephrine-Acetaminophen (ALKA-SELTZER PLS SINUS & COUGH) 10-5-325 MG CAPS Take 1 packet by mouth every 6 (six) hours as needed (cold/sinus).    Yes Historical Provider, MD  dorzolamide (TRUSOPT) 2 % ophthalmic solution Place 1 drop into both eyes 2 (two) times daily.     Yes Historical Provider, MD  guaiFENesin (MUCINEX) 600 MG 12 hr tablet Take 600 mg by mouth 2 (two) times daily.   Yes Historical Provider, MD  guaifenesin (ROBITUSSIN) 100 MG/5ML syrup Take 200 mg by mouth 3 (three) times daily as needed for cough.   Yes Historical Provider, MD  latanoprost (XALATAN) 0.005 % ophthalmic solution Place 1 drop into both eyes at bedtime.     Yes Historical Provider, MD  metoprolol succinate (TOPROL-XL) 50 MG 24 hr tablet TAKE 1 TABLET EVERY DAY 06/05/13  Yes Lelon Perla, MD  niacin (NIASPAN) 1000 MG CR tablet TAKE 1 TABLET BY MOUTH AT BEDTIME 09/21/12  Yes Lelon Perla, MD  ramipril (ALTACE) 5 MG capsule TAKE ONE CAPSULE BY MOUTH EVERY DAY 06/05/13  Yes Lelon Perla, MD  ZETIA 10 MG tablet TAKE 1 TABLET EVERY DAY 06/05/13  Yes Lelon Perla, MD    Physical Exam: Filed Vitals:   07/23/13 3762 07/23/13 0420 07/23/13 0426 07/23/13 0705  BP: 181/70 182/79 139/58  Pulse: 70 78    Temp: 98.8 F (37.1 C)     TempSrc: Oral     Resp: 18 18    Height:    5\' 8"  (1.727 m)  Weight:    63.504 kg (140 lb)  SpO2: 95% 98% 97%      General:  Well-developed well-nourished.  Eyes: Anicteric no pallor.  ENT: No discharge from ears eyes nose mouth.  Neck: No mass felt.  Cardiovascular: S1-S2 heard.  Respiratory: No rhonchi or crepitations.  Abdomen: Soft nontender bowel sounds present.  Skin: No rash.  Musculoskeletal: No edema.  Psychiatric: Appears  normal.  Neurologic: Alert awake oriented to time place and person. Moves all extremities.  Labs on Admission:  Basic Metabolic Panel:  Recent Labs Lab 07/23/13 0401  NA 142  K 3.7  CL 102  GLUCOSE 124*  BUN 15  CREATININE 0.90   Liver Function Tests: No results found for this basename: AST, ALT, ALKPHOS, BILITOT, PROT, ALBUMIN,  in the last 168 hours No results found for this basename: LIPASE, AMYLASE,  in the last 168 hours No results found for this basename: AMMONIA,  in the last 168 hours CBC:  Recent Labs Lab 07/23/13 0340 07/23/13 0401  WBC 10.2  --   HGB 15.2 16.0  HCT 44.3 47.0  MCV 94.9  --   PLT 109*  --    Cardiac Enzymes:  Recent Labs Lab 07/23/13 0340  TROPONINI 0.72*    BNP (last 3 results) No results found for this basename: PROBNP,  in the last 8760 hours CBG: No results found for this basename: GLUCAP,  in the last 168 hours  Radiological Exams on Admission: Ct Angio Chest Pe W/cm &/or Wo Cm  07/23/2013   CLINICAL DATA:  Shortness of breath and hemoptysis.  EXAM: CT ANGIOGRAPHY CHEST WITH CONTRAST  TECHNIQUE: Multidetector CT imaging of the chest was performed using the standard protocol during bolus administration of intravenous contrast. Multiplanar CT image reconstructions including MIPs were obtained to evaluate the vascular anatomy.  CONTRAST:  116mL OMNIPAQUE IOHEXOL 350 MG/ML SOLN  COMPARISON:  Chest radiograph July 23, 2013 at 400 hr and chest radiograph report April 19, 1999 though images are not available for direct comparison.  FINDINGS: Adequate bolus timing. Main pulmonary artery is not enlarged. No pulmonary arterial filling defects to the level of the subsegmental branches.  Heart is mildly enlarged, small pericardial effusion. Thoracic aorta is normal in course and caliber with mild calcific atherosclerosis.  Markedly elevated left hemidiaphragm (described on prior chest radiograph April 19, 1999), with suspected wide neck hernia  containing the majority of the stomach as well as the colonic splenic flexure.  Patchy ground-glass opacities with centrilobular nodules in the right upper lobe, per predominately in the apical and posterior segments, with similar findings in the right lower lobe and to lesser extent right middle lobe. Minimal interstitial prominence and atelectasis in left lung base. Small right pleural effusions. Tracheobronchial tree is patent, with trace pericardial wall thickening. No pneumothorax.  1 cm cyst in the hepatic dome. Soft tissues are nonsuspicious. 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  Review of the MIP images confirms the above findings. Stomach and large bowel within the hernia sac without bowel obstruction.  IMPRESSION: No pulmonary embolism.  Mild bronchial wall thickening in addition to airspace opacities throughout the right lung highly concerning for bronchopneumonia with small right pleural effusion. Recommend followup imaging to verify improvement after treatment.  Markedly elevated left hemidiaphragm  with suspected chronic hernia, no bowel obstruction in the included view.  Preliminary findings discussed with and reconfirmed by Dr. Marnette [name] on July 23, 2013 at approximately 0620 hr.   Electronically Signed   By: Elon Alas   On: 07/23/2013 06:30   Dg Chest Portable 1 View  07/23/2013   CLINICAL DATA:  Chest pain and shortness of breath.  Hemoptysis.  EXAM: PORTABLE CHEST - 1 VIEW  COMPARISON:  None.  FINDINGS: Diffuse right-sided airspace opacification is seen. Dense left basilar airspace opacification is noted. There is elevation of the left hemidiaphragm. No definite pleural effusion or pneumothorax is seen.  The cardiomediastinal silhouette is mildly enlarged. No acute osseous abnormalities are identified.  IMPRESSION: 1. Diffuse right-sided airspace opacification and dense left basilar airspace opacity. Though this could reflect multifocal pneumonia, diffuse  right-sided alveolar hemorrhage could have a similar appearance, given the patient's hemoptysis. Would correlate with the extent of the patient's symptoms. 2. Mild cardiomegaly.   Electronically Signed   By: Garald Balding M.D.   On: 07/23/2013 04:44    EKG: Independently reviewed. Normal sinus rhythm with LBBB and PVCs. Patient has known history of LBBB.  Assessment/Plan Principal Problem:   Community acquired pneumonia Active Problems:   HYPERTENSION   AORTIC STENOSIS   Non-ST elevation MI (NSTEMI)   Hemoptysis   Pneumonia   1. Community-acquired pneumonia - patient has been started on Levaquin which I will be continuing. Check urine for strep and Legionella antigen. Check influenza PCR. 2. Non-ST MI - since patient has significant hemoptysis I am holding off heparin infusion. Continue aspirin. Cycle cardiac markers. I did discuss with on-call cardiologist Dr. Marlou Porch, who will be seeing patient in consult. 3. Severe aortic stenosis - per cardiology. 4. Hemoptysis - probably from #1. Closely observe respiratory status and CBC. 5. Hypertension - continue home medications. 6. Hyperlipidemia - intolerant to statins. On Zetia.  I have reviewed patient's old charts and labs. Is this with on-call cardiologist Dr. Marlou Porch.  [facility] to lack of beds at [facility]. Dr. Domenic Polite will be the accepting physician.  Code Status: Full code.  Family Communication: Patient's wife at the bedside.  Disposition Plan: Admit to inpatient.    KAKRAKANDY,ARSHAD N. [facility] Pager [phone].  If 7PM-7AM, please contact night-coverage [email or web address] Password [email or web address] 07/23/2013, 7:06 AM

## 2013-07-23 NOTE — Progress Notes (Addendum)
Pt seen and examined, admitted earlier this am by Dr.Kakrakandy with cough, dyspnea and Hemoptysis CTA chest with bronchopneumonia Mildly elevated troponin's suggestive of demand ischemia vs NSTEMI, no chest pain, EKG with LBBB unchanged from prior Continue Abx, cycle cardiac enzymes,  -continue ASA, metoprolol, cards consult pending  Domenic Polite, MD [phone]

## 2013-07-23 NOTE — Progress Notes (Signed)
Pharmacy was consulted to dose IV heparin for ACS/STEMI. Upon communication with RN Ander Purpura) and per documentation in EPIC - Dr. Hal Hope would like to hold IV heparin at this time given hemoptysis. Initial CXR could possibly suggest diffused R-sided alveolar hemorrhage.   Pharmacy will hold IV heparin initiation and f/u plans.  Please discontinue IV heparin consult if certain IV heparin will not be started in this patient.   Thank you  Vanessa [place], PharmD, BCPS Pager: [phone] 7:22 AM Pharmacy #: [phone]

## 2013-07-23 NOTE — ED Notes (Signed)
Hospitalist at bedside, states to hold Heparin at this time

## 2013-07-23 NOTE — Progress Notes (Signed)
ANTIBIOTIC CONSULT NOTE - INITIAL  Pharmacy Consult for Levofloxacin Indication: pneumonia  No Known Allergies  Patient Measurements: Height: 5\' 8"  (172.7 cm) Weight: 144 lb 12.8 oz (65.681 kg) IBW/kg (Calculated) : 68.4  Vital Signs: Temp: 98.6 F (37 C) (01/25 1029) Temp src: Oral (01/25 1029) BP: 159/65 mmHg (01/25 1029) Pulse Rate: 82 (01/25 0935) Intake/Output from previous day: 01/24 0701 - 01/25 0700 In: -  Out: 500 [Urine:500] Intake/Output from this shift: Total I/O In: -  Out: 300 [Urine:300]  Labs:  Recent Labs  07/23/13 0340 07/23/13 0401  WBC 10.2  --   HGB 15.2 16.0  PLT 109*  --   CREATININE  --  0.90   Estimated Creatinine Clearance: 59.8 ml/min (by C-G formula based on Cr of 0.9). No results found for this basename: VANCOTROUGH, VANCOPEAK, VANCORANDOM, GENTTROUGH, GENTPEAK, GENTRANDOM, TOBRATROUGH, TOBRAPEAK, TOBRARND, AMIKACINPEAK, AMIKACINTROU, AMIKACIN,  in the last 72 hours   Microbiology: No results found for this or any previous visit (from the past 720 hour(s)).  Medical History: Past Medical History  Diagnosis Date  . Cerebrovascular disease, unspecified   . Aortic valve disorders     stenosis  . HLD (hyperlipidemia)   . HTN (hypertension)     Medications:  Prescriptions prior to admission  Medication Sig Dispense Refill  . aspirin 325 MG EC tablet Take 325 mg by mouth daily.        Marland Kitchen DM-Phenylephrine-Acetaminophen (ALKA-SELTZER PLS SINUS & COUGH) 10-5-325 MG CAPS Take 1 packet by mouth every 6 (six) hours as needed (cold/sinus).       . dorzolamide (TRUSOPT) 2 % ophthalmic solution Place 1 drop into both eyes 2 (two) times daily.        Marland Kitchen guaiFENesin (MUCINEX) 600 MG 12 hr tablet Take 600 mg by mouth 2 (two) times daily.      Marland Kitchen guaifenesin (ROBITUSSIN) 100 MG/5ML syrup Take 200 mg by mouth 3 (three) times daily as needed for cough.      . latanoprost (XALATAN) 0.005 % ophthalmic solution Place 1 drop into both eyes at bedtime.         . metoprolol succinate (TOPROL-XL) 50 MG 24 hr tablet TAKE 1 TABLET EVERY DAY  90 tablet  3  . niacin (NIASPAN) 1000 MG CR tablet TAKE 1 TABLET BY MOUTH AT BEDTIME  90 tablet  3  . ramipril (ALTACE) 5 MG capsule TAKE ONE CAPSULE BY MOUTH EVERY DAY  90 capsule  3  . ZETIA 10 MG tablet TAKE 1 TABLET EVERY DAY  90 tablet  3   Assessment: 80 year old man to start on levofloxacin for pneumonia.  Renal function is normal for age.  Plan:  Levofloxacin 750mg  IV daily  Ryin, Ambrosius 07/23/2013,10:56 AM

## 2013-07-24 DIAGNOSIS — I319 Disease of pericardium, unspecified: Secondary | ICD-10-CM

## 2013-07-24 DIAGNOSIS — R799 Abnormal finding of blood chemistry, unspecified: Secondary | ICD-10-CM

## 2013-07-24 DIAGNOSIS — R042 Hemoptysis: Secondary | ICD-10-CM

## 2013-07-24 LAB — BASIC METABOLIC PANEL
BUN: 11 mg/dL (ref 6–23)
CHLORIDE: 106 meq/L (ref 96–112)
CO2: 23 mEq/L (ref 19–32)
Calcium: 8.9 mg/dL (ref 8.4–10.5)
Creatinine, Ser: 0.82 mg/dL (ref 0.50–1.35)
GFR calc Af Amer: >90 mL/min (ref >90)
GFR calc non Af Amer: 81 mL/min — ABNORMAL LOW (ref >90)
Glucose, Bld: 90 mg/dL (ref 70–99)
Potassium: 3.8 mEq/L (ref 3.7–5.3)
Sodium: 145 mEq/L (ref 137–147)

## 2013-07-24 LAB — HEPARIN LEVEL (UNFRACTIONATED)
Heparin Unfractionated: 0.3 IU/mL (ref 0.30–0.70)
Heparin Unfractionated: 0.23 IU/mL — ABNORMAL LOW (ref 0.30–0.70)

## 2013-07-24 LAB — CBC
HEMATOCRIT: 42.1 % (ref 39.0–52.0)
Hemoglobin: 14.3 g/dL (ref 13.0–17.0)
MCH: 32.7 pg (ref 26.0–34.0)
MCHC: 34 g/dL (ref 30.0–36.0)
MCV: 96.3 fL (ref 78.0–100.0)
PLATELETS: 83 10*3/uL — AB (ref 150–400)
RBC: 4.37 MIL/uL (ref 4.22–5.81)
RDW: 14.2 % (ref 11.5–15.5)
WBC: 8.2 10*3/uL (ref 4.0–10.5)

## 2013-07-24 NOTE — Progress Notes (Signed)
UR Completed Navjot Loera Graves-Bigelow, RN,BSN [phone]  

## 2013-07-24 NOTE — Progress Notes (Addendum)
ANTICOAGULATION CONSULT NOTE - Follow Up Consult  Pharmacy Consult for heparin Indication: chest pain/ACS  No Known Allergies  Patient Measurements: Height: 5\' 8"  (172.7 cm) Weight: 144 lb 12.8 oz (65.681 kg) IBW/kg (Calculated) : 68.4 Heparin Dosing Weight: 65 kg  Vital Signs: Temp: 98.3 F (36.8 C) (01/26 1201) Temp src: Oral (01/26 1201) BP: 137/49 mmHg (01/26 1201) Pulse Rate: 84 (01/26 1201)  Labs:  Recent Labs  07/23/13 0340 07/23/13 0401 07/23/13 1130 07/23/13 1639 07/23/13 2218 07/24/13 0830 07/24/13 1635  HGB 15.2 16.0 13.8  --   --  14.3  --   HCT 44.3 47.0 40.3  --   --  42.1  --   PLT 109*  --  94*  --   --  83*  --   HEPARINUNFRC  --   --   --   --  0.13* 0.30 0.23*  CREATININE  --  0.90 0.74  --   --  0.82  --   TROPONINI 0.72*  --  3.91* 3.19* 3.15*  --   --     Estimated Creatinine Clearance: 65.7 ml/min (by C-G formula based on Cr of 0.82).   Assessment: Patient is an 80 y.o M on heparin for NSTEMI.  Level was just in therapeutic range this morning, but a level was rechecked this afternoon and it had fallen to 0.23units/mL. Per RN, no issues with line and drip was not held. Also no more bleeding or hemoptysis.   Goal of Therapy:  Heparin level 0.3-0.5 Monitor platelets by anticoagulation protocol: Yes   Plan:  1. Increase heparin drip to 1150 units/hr 2. Heparin level with AM labs 3. Daily HL and CBC- monitor platelets closely  Gavinn Collard D. Perlie Scheuring, PharmD, BCPS Clinical Pharmacist Pager: [phone] 07/24/2013 5:51 PM

## 2013-07-24 NOTE — Care Management Note (Unsigned)
    Page 1 of 1   07/24/2013     12:36:47 PM   CARE MANAGEMENT NOTE 07/24/2013  Patient:  [name], [name]   Account Number:  [id number]  Date Initiated:  07/24/2013  Documentation initiated by:  GRAVES-BIGELOW,Maniya Donovan  Subjective/Objective Assessment:   Pt admitted for Shortness of breath.  Tx with Iv levaquin. Pt also has increased troponin. Cardiology following. Pt is from home with wife.     Action/Plan:   CM will continue to monitor for disposition needs.   Anticipated DC Date:  07/26/2013   Anticipated DC Plan:  [place]  CM consult      Choice offered to / List presented to:             Status of service:  In process, will continue to follow Medicare Important Message given?   (If response is "NO", the following Medicare IM given date fields will be blank) Date Medicare IM given:   Date Additional Medicare IM given:    Discharge Disposition:    Per UR Regulation:  Reviewed for med. necessity/level of care/duration of stay  If discussed at [place] of Stay Meetings, dates discussed:    Comments:

## 2013-07-24 NOTE — Progress Notes (Signed)
ANTICOAGULATION CONSULT NOTE - Follow Up Consult  Pharmacy Consult for heparin Indication: chest pain/ACS  No Known Allergies  Patient Measurements: Height: 5\' 8"  (172.7 cm) Weight: 144 lb 12.8 oz (65.681 kg) IBW/kg (Calculated) : 68.4 Heparin Dosing Weight: 65 kg  Vital Signs: Temp: 98.8 F (37.1 C) (01/26 0828) Temp src: Oral (01/26 0828) BP: 168/58 mmHg (01/26 0828) Pulse Rate: 84 (01/26 0828)  Labs:  Recent Labs  07/23/13 0340 07/23/13 0401 07/23/13 1130 07/23/13 1639 07/23/13 2218 07/24/13 0830  HGB 15.2 16.0 13.8  --   --  14.3  HCT 44.3 47.0 40.3  --   --  42.1  PLT 109*  --  94*  --   --  83*  HEPARINUNFRC  --   --   --   --  0.13* 0.30  CREATININE  --  0.90 0.74  --   --   --   TROPONINI 0.72*  --  3.91* 3.19* 3.15*  --     Estimated Creatinine Clearance: 67.3 ml/min (by C-G formula based on Cr of 0.74).   Assessment: Patient is an 80 y.o M on heparin for NSTEMI.  Hgb is stable but plt continues to decrease to 83 today. Per RN, some episodes of hemoptysis last night but none reported this morning.  Heparin level is therapeutic this morning at 0.30.  Goal of Therapy:  Heparin level 0.3-0.5 Monitor platelets by anticoagulation protocol: Yes   Plan:  1) continue heparin drip at 1050 units/hr 2) recheck another heparin level this afternoon to ensure current rate is appropriate for patient before changing to daily level 3) monitor platelets closely  Macario Shear P 07/24/2013,9:49 AM

## 2013-07-24 NOTE — Progress Notes (Signed)
Subjective: No complaints.  Objective: Vital signs in last 24 hours: Temp:  [97.7 F (36.5 C)-99.4 F (37.4 C)] 98.8 F (37.1 C) (01/26 0828) Pulse Rate:  [62-84] 84 (01/26 0828) Resp:  [17-24] 18 (01/26 0828) BP: (113-168)/(44-66) 168/58 mmHg (01/26 0828) SpO2:  [93 %-100 %] 93 % (01/26 0828) Weight:  [144 lb 12.8 oz (65.681 kg)] 144 lb 12.8 oz (65.681 kg) (01/25 1029) Last BM Date: 07/22/13  Intake/Output from previous day: 01/25 0701 - 01/26 0700 In: 600 [P.O.:600] Out: 1650 [Urine:1650] Intake/Output this shift:    Medications Current Facility-Administered Medications  Medication Dose Route Frequency Provider Last Rate Last Dose  . acetaminophen (TYLENOL) tablet 650 mg  650 mg Oral Q6H PRN Rise Patience, MD       Or  . acetaminophen (TYLENOL) suppository 650 mg  650 mg Rectal Q6H PRN Rise Patience, MD      . aspirin EC tablet 325 mg  325 mg Oral Daily Rise Patience, MD      . dorzolamide (TRUSOPT) 2 % ophthalmic solution 1 drop  1 drop Both Eyes BID Rise Patience, MD   1 drop at 07/23/13 2234  . ezetimibe (ZETIA) tablet 10 mg  10 mg Oral Daily Rise Patience, MD   10 mg at 07/23/13 1211  . guaiFENesin (MUCINEX) 12 hr tablet 600 mg  600 mg Oral BID Rise Patience, MD   600 mg at 07/23/13 2232  . guaifenesin (ROBITUSSIN) 100 MG/5ML syrup 200 mg  200 mg Oral TID PRN Rise Patience, MD      . heparin ADULT infusion 100 units/mL (25000 units/250 mL)  1,050 Units/hr Intravenous Continuous Domenic Polite, MD 10.5 mL/hr at 07/23/13 2351 1,050 Units/hr at 07/23/13 2351  . latanoprost (XALATAN) 0.005 % ophthalmic solution 1 drop  1 drop Both Eyes QHS Rise Patience, MD      . levofloxacin (LEVAQUIN) IVPB 750 mg  750 mg Intravenous Q24H Domenic Polite, MD   750 mg at 07/23/13 1213  . metoprolol succinate (TOPROL-XL) 24 hr tablet 50 mg  50 mg Oral Daily Rise Patience, MD   50 mg at 07/23/13 1211  . niacin (NIASPAN) CR tablet 1,000  mg  1,000 mg Oral QHS Rise Patience, MD   1,000 mg at 07/23/13 2233  . nitroGLYCERIN (NITROSTAT) SL tablet 0.4 mg  0.4 mg Sublingual Q5 min PRN Teressa Lower, MD   0.4 mg at 07/23/13 0421  . ondansetron (ZOFRAN) tablet 4 mg  4 mg Oral Q6H PRN Rise Patience, MD       Or  . ondansetron [facility]) injection 4 mg  4 mg Intravenous Q6H PRN Rise Patience, MD      . pneumococcal 23 valent vaccine (PNU-IMMUNE) injection 0.5 mL  0.5 mL Intramuscular Tomorrow-1000 Domenic Polite, MD      . ramipril (ALTACE) capsule 5 mg  5 mg Oral Daily Rise Patience, MD   5 mg at 07/23/13 1212  . sodium chloride 0.9 % injection 3 mL  3 mL Intravenous Q12H Rise Patience, MD      . sodium chloride 0.9 % injection 3 mL  3 mL Intravenous Q12H Rise Patience, MD   3 mL at 07/23/13 1212    PE: General appearance: alert, cooperative and no distress Lungs: clear to auscultation bilaterally Heart: regular rate and rhythm and 2/6 sys MM Extremities: No LEE Pulses: 2+ and symmetric Skin: Warm and dry. Neurologic: Grossly  normal.    Lab Results:   Recent Labs  07/23/13 0340 07/23/13 0401 07/23/13 1130  WBC 10.2  --  7.3  HGB 15.2 16.0 13.8  HCT 44.3 47.0 40.3  PLT 109*  --  94*   BMET  Recent Labs  07/23/13 0401 07/23/13 1130  NA 142 144  K 3.7 4.1  CL 102 105  CO2  --  25  GLUCOSE 124* 103*  BUN 15 12  CREATININE 0.90 0.74  CALCIUM  --  8.5   Echo 07/24/13: - Left ventricle: Poor acoustic windows limit study [place] LVEF appears moderately depressed with hypokinesis/akinesis of the inferior, posterior and inferolatel The cavity size was normal. Systolic function was normal. The estimated ejection fraction was in the range of 60% to 65%. Doppler parameters are consistent with abnormal left ventricular relaxation (grade 1 diastolic dysfunction). - Aortic valve: AV is not well visualized. Appears thickened. Peak and mean gradients through the valve are 82 and 52 mm Hg  respectively consistent with severe/critical AS.    Assessment/Plan   Principal Problem:   Community acquired pneumonia Active Problems:   HYPERTENSION   AORTIC STENOSIS   Non-ST elevation MI (NSTEMI)   Hemoptysis   Pneumonia  Plan:  Known severe AS, chronic LBBB.  Admitted with PNA and NSTEMI.  Peak troponin 3.91. Thought to be demand ischemia with current illness and AS.  LBBB on EKG.  SR on tele with PVCs.  Echo in progress.  Currently doing very well with no complaints.  No PE on CTA.  BP elevated this morning but seems to be controlled for the most part.  Continue to monitor.      LOS: 1 day    HAGER, BRYAN 07/24/2013 9:09 AM  I have personally seen and examined this patient with Tarri Fuller, PA-C. I agree with the assessment and plan as outlined above. Pt is admitted with pneumonia. Agree that elevated troponin is likely due to demand ischemia but we have never looked for CAD. He has not had a prior cardiac cath. His aortic stenosis has worsened since echo in 2013, mean gradient of 52 mmHg. He has had no symptoms due to his aortic stenosis. I have had a long discussion with the patient and his wife regarding the natural progression of aortic valve stenosis. He will most certainly need to have this addressed in the near future. Will likely plan cardiac cath this week, especially given elevated troponin. Would start with right and left heart cath to assess the aortic valve and exclude CAD. I will follow with you and discuss further. He is on antibiotics for his pneumonia. Continue IV heparin for now.   MCALHANY,CHRISTOPHER 3:15 PM 07/24/2013

## 2013-07-24 NOTE — Progress Notes (Signed)
  Echocardiogram 2D Echocardiogram has been performed.  [name] 07/24/2013, 10:17 AM

## 2013-07-24 NOTE — Progress Notes (Signed)
PHYSICIAN PROGRESS NOTE  BRIAN ZEITLIN OZH:[id number] DOB: 24-Nov-1929 DOA: 07/23/2013  07/24/2013   PCP: Redge Gainer, MD  Assessment/Plan: 1. Community-acquired pneumonia - patient has been started on Levaquin. Check urine for strep and Legionella antigen.  Influenza PCR pending. 2. Non-STEMI - Pt on heparin infusion. Continue aspirin. troponins elevated. cardiologist following.  3. Severe aortic stenosis - repeat echocardiogram pending.  per cardiology. 4. Hemoptysis - probably from #1. No recurrence reported.  Closely observe respiratory status and CBC. Hg stable.  5. Hypertension - continue home medications. 6. Hyperlipidemia - intolerant to statins. On Zetia.  Code Status: Full code.  Family Communication: Patient's wife was called but no answer Disposition Plan: Admitted to inpatient.   HPI/Subjective: Pt reports no further hemoptysis, no CP, no SOB  Objective: Filed Vitals:   07/24/13 0500  BP: 113/44  Pulse: 78  Temp: 98.1 F (36.7 C)  Resp: 18    Intake/Output Summary (Last 24 hours) at 07/24/13 0813 Last data filed at 07/24/13 0500  Gross per 24 hour  Intake    600 ml  Output   1650 ml  Net  -1050 ml   Filed Weights   07/23/13 0705 07/23/13 1029  Weight: 140 lb (63.504 kg) 144 lb 12.8 oz (65.681 kg)    Exam:  General: Well-developed well-nourished. No apparent distress.  Eyes: Anicteric no pallor.  ENT: MMM Neck: No mass felt.  Cardiovascular: S1-S2 heard.  Respiratory: BBS shallow, no wheezes or rhonchi  Abdomen: Soft nontender bowel sounds present.  Musculoskeletal: No cyanosis or edema.  Psychiatric: Appears normal.  Neurologic: Alert awake oriented to time place and person. Moves all extremities.  Data Reviewed: Basic Metabolic Panel:  Recent Labs Lab 07/23/13 0401 07/23/13 1130  NA 142 144  K 3.7 4.1  CL 102 105  CO2  --  25  GLUCOSE 124* 103*  BUN 15 12  CREATININE 0.90 0.74  CALCIUM  --  8.5   Liver Function Tests:  Recent  Labs Lab 07/23/13 1130  AST 33  ALT 12  ALKPHOS 44  BILITOT 0.7  PROT 6.0  ALBUMIN 3.3*   No results found for this basename: LIPASE, AMYLASE,  in the last 168 hours No results found for this basename: AMMONIA,  in the last 168 hours CBC:  Recent Labs Lab 07/23/13 0340 07/23/13 0401 07/23/13 1130  WBC 10.2  --  7.3  NEUTROABS  --   --  5.2  HGB 15.2 16.0 13.8  HCT 44.3 47.0 40.3  MCV 94.9  --  95.0  PLT 109*  --  94*   Cardiac Enzymes:  Recent Labs Lab 07/23/13 0340 07/23/13 1130 07/23/13 1639 07/23/13 2218  TROPONINI 0.72* 3.91* 3.19* 3.15*   BNP (last 3 results) No results found for this basename: PROBNP,  in the last 8760 hours CBG: No results found for this basename: GLUCAP,  in the last 168 hours  Recent Results (from the past 240 hour(s))  CULTURE, RESPIRATORY (NON-EXPECTORATED)     Status: None   Collection Time    07/23/13  7:03 AM      Result Value Range Status   Specimen Description SPUTUM   Final   Special Requests Normal   Final   Gram Stain     Final   Value: FEW WBC PRESENT, PREDOMINANTLY PMN     FEW SQUAMOUS EPITHELIAL CELLS PRESENT     FEW GRAM POSITIVE COCCI IN PAIRS     IN CHAINS RARE GRAM NEGATIVE RODS  Performed at [facility] PENDING   Incomplete   Report Status PENDING   Incomplete     Studies: Ct Angio Chest Pe W/cm &/or Wo Cm  07/23/2013   CLINICAL DATA:  Shortness of breath and hemoptysis.  EXAM: CT ANGIOGRAPHY CHEST WITH CONTRAST  TECHNIQUE: Multidetector CT imaging of the chest was performed using the standard protocol during bolus administration of intravenous contrast. Multiplanar CT image reconstructions including MIPs were obtained to evaluate the vascular anatomy.  CONTRAST:  196mL OMNIPAQUE IOHEXOL 350 MG/ML SOLN  COMPARISON:  Chest radiograph July 23, 2013 at 400 hr and chest radiograph report April 19, 1999 though images are not available for direct comparison.  FINDINGS: Adequate bolus timing.  Main pulmonary artery is not enlarged. No pulmonary arterial filling defects to the level of the subsegmental branches.  Heart is mildly enlarged, small pericardial effusion. Thoracic aorta is normal in course and caliber with mild calcific atherosclerosis.  Markedly elevated left hemidiaphragm (described on prior chest radiograph April 19, 1999), with suspected wide neck hernia containing the majority of the stomach as well as the colonic splenic flexure.  Patchy ground-glass opacities with centrilobular nodules in the right upper lobe, per predominately in the apical and posterior segments, with similar findings in the right lower lobe and to lesser extent right middle lobe. Minimal interstitial prominence and atelectasis in left lung base. Small right pleural effusions. Tracheobronchial tree is patent, with trace pericardial wall thickening. No pneumothorax.  1 cm cyst in the hepatic dome. Soft tissues are nonsuspicious. 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  Review of the MIP images confirms the above findings. Stomach and large bowel within the hernia sac without bowel obstruction.  IMPRESSION: No pulmonary embolism.  Mild bronchial wall thickening in addition to airspace opacities throughout the right lung highly concerning for bronchopneumonia with small right pleural effusion. Recommend followup imaging to verify improvement after treatment.  Markedly elevated left hemidiaphragm with suspected chronic hernia, no bowel obstruction in the included view.  Preliminary findings discussed with and reconfirmed by Dr. Marnette [name] on July 23, 2013 at approximately 0620 hr.   Electronically Signed   By: Elon Alas   On: 07/23/2013 06:30   Dg Chest Portable 1 View  07/23/2013   CLINICAL DATA:  Chest pain and shortness of breath.  Hemoptysis.  EXAM: PORTABLE CHEST - 1 VIEW  COMPARISON:  None.  FINDINGS: Diffuse right-sided airspace opacification is seen. Dense left basilar airspace  opacification is noted. There is elevation of the left hemidiaphragm. No definite pleural effusion or pneumothorax is seen.  The cardiomediastinal silhouette is mildly enlarged. No acute osseous abnormalities are identified.  IMPRESSION: 1. Diffuse right-sided airspace opacification and dense left basilar airspace opacity. Though this could reflect multifocal pneumonia, diffuse right-sided alveolar hemorrhage could have a similar appearance, given the patient's hemoptysis. Would correlate with the extent of the patient's symptoms. 2. Mild cardiomegaly.   Electronically Signed   By: Garald Balding M.D.   On: 07/23/2013 04:44   Scheduled Meds: . aspirin EC  325 mg Oral Daily  . dorzolamide  1 drop Both Eyes BID  . ezetimibe  10 mg Oral Daily  . guaiFENesin  600 mg Oral BID  . latanoprost  1 drop Both Eyes QHS  . levofloxacin (LEVAQUIN) IV  750 mg Intravenous Q24H  . metoprolol succinate  50 mg Oral Daily  . niacin  1,000 mg Oral QHS  . pneumococcal 23 valent vaccine  0.5 mL Intramuscular  Tomorrow-1000  . ramipril  5 mg Oral Daily  . sodium chloride  3 mL Intravenous Q12H  . sodium chloride  3 mL Intravenous Q12H   Continuous Infusions: . heparin 1,050 Units/hr (07/23/13 2351)    Principal Problem:   Community acquired pneumonia Active Problems:   HYPERTENSION   AORTIC STENOSIS   Non-ST elevation MI (NSTEMI)   Hemoptysis   Pneumonia  [facility]  [facility] Pager [phone]. If 7PM-7AM, please contact night-coverage at [email or web address], password [facility] 07/24/2013, 8:13 AM  LOS: 1 day

## 2013-07-25 LAB — CBC
HEMATOCRIT: 38.6 % — AB (ref 39.0–52.0)
Hemoglobin: 13.1 g/dL (ref 13.0–17.0)
MCH: 32.2 pg (ref 26.0–34.0)
MCHC: 33.9 g/dL (ref 30.0–36.0)
MCV: 94.8 fL (ref 78.0–100.0)
PLATELETS: 93 10*3/uL — AB (ref 150–400)
RBC: 4.07 MIL/uL — ABNORMAL LOW (ref 4.22–5.81)
RDW: 14 % (ref 11.5–15.5)
WBC: 6.4 10*3/uL (ref 4.0–10.5)

## 2013-07-25 LAB — CULTURE, RESPIRATORY W GRAM STAIN
Culture: NORMAL
Special Requests: NORMAL

## 2013-07-25 LAB — INFLUENZA PANEL BY PCR (TYPE A & B)
H1N1 flu by pcr: NOT DETECTED
INFLAPCR: NEGATIVE
Influenza B By PCR: NEGATIVE

## 2013-07-25 LAB — T4, FREE: Free T4: 1.18 ng/dL (ref 0.80–1.80)

## 2013-07-25 LAB — CULTURE, RESPIRATORY

## 2013-07-25 LAB — HEPARIN LEVEL (UNFRACTIONATED): HEPARIN UNFRACTIONATED: 0.48 [IU]/mL (ref 0.30–0.70)

## 2013-07-25 MED ORDER — ASPIRIN 81 MG PO CHEW
81.0000 mg | CHEWABLE_TABLET | ORAL | Status: AC
  Administered 2013-07-26: 81 mg via ORAL
  Filled 2013-07-25: qty 1

## 2013-07-25 MED ORDER — SODIUM CHLORIDE 0.9 % IJ SOLN
3.0000 mL | Freq: Two times a day (BID) | INTRAMUSCULAR | Status: DC
  Administered 2013-07-25: 3 mL via INTRAVENOUS

## 2013-07-25 MED ORDER — SODIUM CHLORIDE 0.9 % IV SOLN
INTRAVENOUS | Status: DC
  Administered 2013-07-26: 05:00:00 via INTRAVENOUS

## 2013-07-25 MED ORDER — SODIUM CHLORIDE 0.9 % IV SOLN: 250.0000 mL | INTRAVENOUS | Status: DC | PRN

## 2013-07-25 MED ORDER — LEVOFLOXACIN 750 MG PO TABS
750.0000 mg | ORAL_TABLET | Freq: Every day | ORAL | Status: DC
  Administered 2013-07-25 – 2013-07-27 (×2): 750 mg via ORAL
  Filled 2013-07-25 (×3): qty 1

## 2013-07-25 MED ORDER — DIAZEPAM 2 MG PO TABS
2.0000 mg | ORAL_TABLET | ORAL | Status: DC
  Filled 2013-07-25: qty 1

## 2013-07-25 MED ORDER — SODIUM CHLORIDE 0.9 % IJ SOLN: 3.0000 mL | INTRAMUSCULAR | Status: DC | PRN

## 2013-07-25 NOTE — Progress Notes (Addendum)
ANTICOAGULATION and ANTIBIOTIC CONSULT NOTE - Follow Up Consult  Pharmacy Consult for heparin, levaquin Indication: chest pain/ACS, CAP  No Known Allergies  Patient Measurements: Height: 5\' 8"  (172.7 cm) Weight: 143 lb 3.2 oz (64.955 kg) IBW/kg (Calculated) : 68.4 Heparin Dosing Weight: 65 kg  Vital Signs: Temp: 98.1 F (36.7 C) (01/27 0811) Temp src: Oral (01/27 0811) BP: 161/86 mmHg (01/27 0811) Pulse Rate: 76 (01/27 0811)  Labs:  Recent Labs  07/23/13 0401 07/23/13 1130 07/23/13 1639  07/23/13 2218 07/24/13 0830 07/24/13 1635 07/25/13 0500 07/25/13 0555  HGB 16.0 13.8  --   --   --  14.3  --   --  13.1  HCT 47.0 40.3  --   --   --  42.1  --   --  38.6*  PLT  --  94*  --   --   --  83*  --   --  93*  HEPARINUNFRC  --   --   --   < > 0.13* 0.30 0.23* 0.48  --   CREATININE 0.90 0.74  --   --   --  0.82  --   --   --   TROPONINI  --  3.91* 3.19*  --  3.15*  --   --   --   --   < > = values in this interval not displayed.  Estimated Creatinine Clearance: 65 ml/min (by C-G formula based on Cr of 0.82).  Assessment: Patient is an 80 y.o M on heparin for NSTEMI with plan for possible cardiac cath later this week.  Heparin level is at goal this morning with 0.48.  Platelets are low but increased from 83 to 93 today. No hemoptysis noted this AM per RN.   Patient's also on levaquin day #3 for CAP.  Renal function is stable. 1/25 LVQ Rx>>  1/25 sputum>> normal flora 1/25 unflu pcr >> pending   Goal of Therapy:  Heparin level 0.3-0.5 units/ml Monitor platelets by anticoagulation protocol: Yes   Plan:  1) continue heparin drip at 1150 units/hr 2) Patient's tolerating PO meds.  Will change levaquin to 750mg  PO q24h.  Anni Hocevar P 07/25/2013,8:39 AM

## 2013-07-25 NOTE — Progress Notes (Signed)
PHYSICIAN PROGRESS NOTE  [name] YSA:[id number] DOB: Jan 21, 1930 DOA: 07/23/2013  07/25/2013   PCP: Redge Gainer, MD  Assessment/Plan: 1. Community-acquired pneumonia - patient has been started on Levaquin and clinically has some improvement. Check urine for strep and Legionella antigen. Influenza PCR pending. Sputum culture pending 2. Non-STEMI - Pt on heparin infusion,  aspirin. troponins elevated. cardiologist following. Possible cath later this week.  3. Severe aortic stenosis - repeat echocardiogram reviewed. recs per cardiology. 4. Hemoptysis - persistent.  Hg stable. Pt also on heparin at this time.  Closely observe respiratory status and CBC. Hg stable. Following.  5. Hypertension - continue home medications. 6. Hyperlipidemia - intolerant to statins. On Zetia.  Code Status: Full code.  Family Communication: Patient's wife was called but no answer  Disposition Plan: Admitted to inpatient.   HPI/Subjective: Pt reports that he is still coughing up blood.   Objective: Filed Vitals:   07/25/13 0440  BP: 128/61  Pulse: 70  Temp: 97.8 F (36.6 C)  Resp: 18    Intake/Output Summary (Last 24 hours) at 07/25/13 0808 Last data filed at 07/24/13 2100  Gross per 24 hour  Intake    720 ml  Output    375 ml  Net    345 ml   Filed Weights   07/23/13 0705 07/23/13 1029 07/25/13 0440  Weight: 140 lb (63.504 kg) 144 lb 12.8 oz (65.681 kg) 143 lb 3.2 oz (64.955 kg)   Exam:  General: Well-developed well-nourished. No apparent distress.  Eyes: Anicteric no pallor.  ENT: MMM  Neck: No mass felt.  Cardiovascular: S1-S2 heard.  Respiratory: BBS with crackles right base heard, no wheezes or rhonchi  Abdomen: Soft nontender bowel sounds present.  Musculoskeletal: No cyanosis or edema.  Psychiatric: Appears normal.  Neurologic: Alert awake oriented to time place and person. Moves all extremities.   Data Reviewed: Basic Metabolic Panel:  Recent Labs Lab 07/23/13 0401  07/23/13 1130 07/24/13 0830  NA 142 144 145  K 3.7 4.1 3.8  CL 102 105 106  CO2  --  25 23  GLUCOSE 124* 103* 90  BUN 15 12 11   CREATININE 0.90 0.74 0.82  CALCIUM  --  8.5 8.9   Liver Function Tests:  Recent Labs Lab 07/23/13 1130  AST 33  ALT 12  ALKPHOS 44  BILITOT 0.7  PROT 6.0  ALBUMIN 3.3*   No results found for this basename: LIPASE, AMYLASE,  in the last 168 hours No results found for this basename: AMMONIA,  in the last 168 hours CBC:  Recent Labs Lab 07/23/13 0340 07/23/13 0401 07/23/13 1130 07/24/13 0830 07/25/13 0555  WBC 10.2  --  7.3 8.2 6.4  NEUTROABS  --   --  5.2  --   --   HGB 15.2 16.0 13.8 14.3 13.1  HCT 44.3 47.0 40.3 42.1 38.6*  MCV 94.9  --  95.0 96.3 94.8  PLT 109*  --  94* 83* 93*   Cardiac Enzymes:  Recent Labs Lab 07/23/13 0340 07/23/13 1130 07/23/13 1639 07/23/13 2218  TROPONINI 0.72* 3.91* 3.19* 3.15*   BNP (last 3 results) No results found for this basename: PROBNP,  in the last 8760 hours CBG: No results found for this basename: GLUCAP,  in the last 168 hours  Recent Results (from the past 240 hour(s))  CULTURE, RESPIRATORY (NON-EXPECTORATED)     Status: None   Collection Time    07/23/13  7:03 AM      Result  Value Range Status   Specimen Description SPUTUM   Final   Special Requests Normal   Final   Gram Stain     Final   Value: FEW WBC PRESENT, PREDOMINANTLY PMN     FEW SQUAMOUS EPITHELIAL CELLS PRESENT     FEW GRAM POSITIVE COCCI IN PAIRS     IN CHAINS RARE GRAM NEGATIVE RODS     Performed at [facility]   Culture     Final   Value: NORMAL OROPHARYNGEAL FLORA     Performed at [facility]   Report Status PENDING   Incomplete     Studies: No results found.  Scheduled Meds: . aspirin EC  325 mg Oral Daily  . dorzolamide  1 drop Both Eyes BID  . ezetimibe  10 mg Oral Daily  . guaiFENesin  600 mg Oral BID  . latanoprost  1 drop Both Eyes QHS  . levofloxacin (LEVAQUIN) IV  750 mg  Intravenous Q24H  . metoprolol succinate  50 mg Oral Daily  . niacin  1,000 mg Oral QHS  . pneumococcal 23 valent vaccine  0.5 mL Intramuscular Tomorrow-1000  . ramipril  5 mg Oral Daily  . sodium chloride  3 mL Intravenous Q12H  . sodium chloride  3 mL Intravenous Q12H   Continuous Infusions: . heparin 1,150 Units/hr (07/24/13 1823)   Principal Problem:   Community acquired pneumonia Active Problems:   HYPERTENSION   AORTIC STENOSIS   Non-ST elevation MI (NSTEMI)   Hemoptysis   Pneumonia  Kathrynn Backstrom [facility]  [facility] Pager [phone]. If 7PM-7AM, please contact night-coverage at [email or web address], password [facility] 07/25/2013, 8:08 AM  LOS: 2 days

## 2013-07-25 NOTE — Progress Notes (Signed)
     SUBJECTIVE: No chest pain. Breathing is ok. Still having some blood tinged sputum production.   BP 161/86  Pulse 76  Temp(Src) 98.1 F (36.7 C) (Oral)  Resp 18  Ht 5\' 8"  (1.727 m)  Wt 143 lb 3.2 oz (64.955 kg)  BMI 21.78 kg/m2  SpO2 100%  Intake/Output Summary (Last 24 hours) at 07/25/13 1058 Last data filed at 07/24/13 2100  Gross per 24 hour  Intake    360 ml  Output    375 ml  Net    -15 ml    PHYSICAL EXAM General: Well developed, well nourished, in no acute distress. Alert and oriented x 3.  Psych:  Good affect, responds appropriately Neck: No JVD. No masses noted.  Lungs: Clear bilaterally with no wheezes or rhonci noted.  Heart: RRR with systolic murmur noted.  Abdomen: Bowel sounds are present. Soft, non-tender.  Extremities: No lower extremity edema.   LABS: Basic Metabolic Panel:  Recent Labs  07/23/13 1130 07/24/13 0830  NA 144 145  K 4.1 3.8  CL 105 106  CO2 25 23  GLUCOSE 103* 90  BUN 12 11  CREATININE 0.74 0.82  CALCIUM 8.5 8.9   CBC:  Recent Labs  07/23/13 0401 07/23/13 1130 07/24/13 0830 07/25/13 0555  WBC  --  7.3 8.2 6.4  NEUTROABS  --  5.2  --   --   HGB 16.0 13.8 14.3 13.1  HCT 47.0 40.3 42.1 38.6*  MCV  --  95.0 96.3 94.8  PLT  --  94* 83* 93*   Cardiac Enzymes:  Recent Labs  07/23/13 1130 07/23/13 1639 07/23/13 2218  TROPONINI 3.91* 3.19* 3.15*    Current Meds: . aspirin EC  325 mg Oral Daily  . dorzolamide  1 drop Both Eyes BID  . ezetimibe  10 mg Oral Daily  . guaiFENesin  600 mg Oral BID  . latanoprost  1 drop Both Eyes QHS  . levofloxacin  750 mg Oral Q1200  . metoprolol succinate  50 mg Oral Daily  . niacin  1,000 mg Oral QHS  . pneumococcal 23 valent vaccine  0.5 mL Intramuscular Tomorrow-1000  . ramipril  5 mg Oral Daily  . sodium chloride  3 mL Intravenous Q12H  . sodium chloride  3 mL Intravenous Q12H    ASSESSMENT AND PLAN:  1. Pneumonia: Clinically improving on antibiotics. Still with mild  blood tinged sputum. Cough improving.   2. Elevated troponin: Likely demand ischemia but we have never looked for CAD. He has not had a prior cardiac cath. Given his worsened aortic valve stenosis and elevated troponin, will plan right and left heart cath tomorrow. He has not chest pain and has had no angina at home.  Continue IV heparin until after cath.   3. Aortic valve stenosis: Severe/critical by echo this admission. Mean gradient 52 mm Hg. He has had no symptoms due to his aortic stenosis. I have had a long discussion with the patient and his wife regarding the natural progression of aortic valve stenosis. He will most certainly need to have this addressed in the near future.  Plan right and left heart cath tomorrow. Will follow with Dr. Stanford Breed after discharge.    Rozanna Cormany  1/27/201510:58 AM

## 2013-07-26 ENCOUNTER — Encounter (HOSPITAL_COMMUNITY)
Admission: EM | Disposition: A | Discharge status: Home / Self Care | Payer: Self-pay | Source: Home / Self Care | Attending: Family Medicine

## 2013-07-26 DIAGNOSIS — I2510 Atherosclerotic heart disease of native coronary artery without angina pectoris: Secondary | ICD-10-CM

## 2013-07-26 HISTORY — PX: LEFT AND RIGHT HEART CATHETERIZATION WITH CORONARY ANGIOGRAM: SHX5449

## 2013-07-26 LAB — POCT I-STAT 3, VENOUS BLOOD GAS (G3P V)
Acid-base deficit: 5 mmol/L — ABNORMAL HIGH (ref 0.0–2.0)
Bicarbonate: 20.9 mEq/L (ref 20.0–24.0)
O2 SAT: 65 %
PO2 VEN: 37 mmHg (ref 30.0–45.0)
TCO2: 22 mmol/L (ref 0–100)
pCO2, Ven: 40.8 mmHg — ABNORMAL LOW (ref 45.0–50.0)
pH, Ven: 7.316 — ABNORMAL HIGH (ref 7.250–7.300)

## 2013-07-26 LAB — BASIC METABOLIC PANEL
BUN: 9 mg/dL (ref 6–23)
CO2: 23 mEq/L (ref 19–32)
Calcium: 8.4 mg/dL (ref 8.4–10.5)
Chloride: 110 mEq/L (ref 96–112)
Creatinine, Ser: 0.96 mg/dL (ref 0.50–1.35)
GFR, EST AFRICAN AMERICAN: 88 mL/min — AB (ref >90)
GFR, EST NON AFRICAN AMERICAN: 76 mL/min — AB (ref >90)
GLUCOSE: 119 mg/dL — AB (ref 70–99)
Potassium: 3.9 mEq/L (ref 3.7–5.3)
Sodium: 146 mEq/L (ref 137–147)

## 2013-07-26 LAB — CBC
HCT: 38 % — ABNORMAL LOW (ref 39.0–52.0)
HCT: 38.1 % — ABNORMAL LOW (ref 39.0–52.0)
Hemoglobin: 13.1 g/dL (ref 13.0–17.0)
Hemoglobin: 12.9 g/dL — ABNORMAL LOW (ref 13.0–17.0)
MCH: 32.8 pg (ref 26.0–34.0)
MCH: 32.5 pg (ref 26.0–34.0)
MCHC: 34.5 g/dL (ref 30.0–36.0)
MCHC: 33.9 g/dL (ref 30.0–36.0)
MCV: 95 fL (ref 78.0–100.0)
MCV: 96 fL (ref 78.0–100.0)
PLATELETS: 89 10*3/uL — AB (ref 150–400)
Platelets: 80 10*3/uL — ABNORMAL LOW (ref 150–400)
RBC: 4 MIL/uL — ABNORMAL LOW (ref 4.22–5.81)
RBC: 3.97 MIL/uL — ABNORMAL LOW (ref 4.22–5.81)
RDW: 14.2 % (ref 11.5–15.5)
RDW: 14.3 % (ref 11.5–15.5)
WBC: 6.5 10*3/uL (ref 4.0–10.5)
WBC: 4.5 10*3/uL (ref 4.0–10.5)

## 2013-07-26 LAB — POCT I-STAT 3, ART BLOOD GAS (G3+)
ACID-BASE DEFICIT: 3 mmol/L — AB (ref 0.0–2.0)
Bicarbonate: 22.4 mEq/L (ref 20.0–24.0)
O2 Saturation: 94 %
PCO2 ART: 39.1 mmHg (ref 35.0–45.0)
TCO2: 24 mmol/L (ref 0–100)
pH, Arterial: 7.366 (ref 7.350–7.450)
pO2, Arterial: 73 mmHg — ABNORMAL LOW (ref 80.0–100.0)

## 2013-07-26 LAB — POCT ACTIVATED CLOTTING TIME
Activated Clotting Time: 143 seconds
Activated Clotting Time: 182 seconds

## 2013-07-26 LAB — HEPARIN LEVEL (UNFRACTIONATED): Heparin Unfractionated: 0.42 IU/mL (ref 0.30–0.70)

## 2013-07-26 LAB — PROTIME-INR
INR: 1.17 (ref 0.00–1.49)
Prothrombin Time: 14.7 seconds (ref 11.6–15.2)

## 2013-07-26 LAB — CREATININE, SERUM
Creatinine, Ser: 0.8 mg/dL (ref 0.50–1.35)
GFR calc Af Amer: >90 mL/min (ref >90)
GFR calc non Af Amer: 82 mL/min — ABNORMAL LOW (ref >90)

## 2013-07-26 LAB — GLUCOSE, CAPILLARY: Glucose-Capillary: 146 mg/dL — ABNORMAL HIGH (ref 70–99)

## 2013-07-26 SURGERY — LEFT AND RIGHT HEART CATHETERIZATION WITH CORONARY ANGIOGRAM
Anesthesia: LOCAL

## 2013-07-26 MED ORDER — NITROGLYCERIN 0.2 MG/ML ON CALL CATH LAB
INTRAVENOUS | Status: AC
  Filled 2013-07-26: qty 1

## 2013-07-26 MED ORDER — ONDANSETRON HCL 4 MG/2ML IJ SOLN
4.0000 mg | Freq: Four times a day (QID) | INTRAMUSCULAR | Status: DC | PRN
Start: 2013-07-26 — End: 2013-07-27

## 2013-07-26 MED ORDER — LIDOCAINE HCL (PF) 1 % IJ SOLN
INTRAMUSCULAR | Status: AC
  Filled 2013-07-26: qty 30

## 2013-07-26 MED ORDER — HEPARIN SODIUM (PORCINE) 5000 UNIT/ML IJ SOLN
5000.0000 [IU] | Freq: Three times a day (TID) | INTRAMUSCULAR | Status: DC
  Administered 2013-07-26 – 2013-07-27 (×3): 5000 [IU] via SUBCUTANEOUS
  Filled 2013-07-26 (×6): qty 1

## 2013-07-26 MED ORDER — FENTANYL CITRATE 0.05 MG/ML IJ SOLN
INTRAMUSCULAR | Status: AC
  Filled 2013-07-26: qty 2

## 2013-07-26 MED ORDER — MIDAZOLAM HCL 2 MG/2ML IJ SOLN
INTRAMUSCULAR | Status: AC
  Filled 2013-07-26: qty 2

## 2013-07-26 MED ORDER — SODIUM CHLORIDE 0.9 % IV SOLN: INTRAVENOUS | Status: AC

## 2013-07-26 MED ORDER — HEPARIN SODIUM (PORCINE) 1000 UNIT/ML IJ SOLN
INTRAMUSCULAR | Status: AC
  Filled 2013-07-26: qty 1

## 2013-07-26 MED ORDER — HEPARIN (PORCINE) IN NACL 2-0.9 UNIT/ML-% IJ SOLN
INTRAMUSCULAR | Status: AC
  Filled 2013-07-26: qty 1500

## 2013-07-26 NOTE — Progress Notes (Signed)
PHYSICIAN PROGRESS NOTE  [name] DXI:[id number] DOB: [birth year] DOA: 07/23/2013  07/26/2013   PCP: Gregory Gainer, MD  Assessment/Plan: 1. Community-acquired pneumonia - patient has been started on Levaquin and clinically has some improvement. Check urine for strep and Legionella antigen. Influenza PCR negative. Sputum culture with normal OP flora. 2. Non-STEMI - Pt on heparin infusion,  aspirin. troponins elevated. cardiologist following. For cardiac cath today. 3. Severe aortic stenosis - repeat echocardiogram reviewed. recs per cardiology. 4. Hemoptysis - Seems to be improving.  Hb stable. Pt also on heparin at this time.  Closely observe respiratory status and CBC.  5. Hypertension - continue home medications. 6. Hyperlipidemia - intolerant to statins. On Zetia.  Code Status: Full code.  Family Communication: Patient's wife and cousins at bedside updated on plan of care. Disposition Plan: home once medically clear.  HPI/Subjective: No complaints.  Objective: Filed Vitals:   07/26/13 0800  BP: 160/54  Pulse: 66  Temp: 98.5 F (36.9 C)  Resp: 18    Intake/Output Summary (Last 24 hours) at 07/26/13 1118 Last data filed at 07/25/13 2246  Gross per 24 hour  Intake 930.33 ml  Output    726 ml  Net 204.33 ml   Filed Weights   07/23/13 1029 07/25/13 0440 07/26/13 0436  Weight: 65.681 kg (144 lb 12.8 oz) 64.955 kg (143 lb 3.2 oz) 64.9 kg (143 lb 1.3 oz)   Exam:  General: Well-developed well-nourished. No apparent distress.  Eyes: Anicteric no pallor.  ENT: MMM  Neck: No mass felt.  Cardiovascular: S1-S2 heard.  Respiratory: BBS with crackles right base heard, no wheezes or rhonchi  Abdomen: Soft nontender bowel sounds present.  Musculoskeletal: No cyanosis or edema.  Psychiatric: Appears normal.  Neurologic: Alert awake oriented to time place and person. Moves all extremities.   Data Reviewed: Basic Metabolic Panel:  Recent Labs Lab 07/23/13 0401  07/23/13 1130 07/24/13 0830 07/26/13 0456  NA 142 144 145 146  K 3.7 4.1 3.8 3.9  CL 102 105 106 110  CO2  --  25 23 23   GLUCOSE 124* 103* 90 119*  BUN 15 12 11 9   CREATININE 0.90 0.74 0.82 0.96  CALCIUM  --  8.5 8.9 8.4   Liver Function Tests:  Recent Labs Lab 07/23/13 1130  AST 33  ALT 12  ALKPHOS 44  BILITOT 0.7  PROT 6.0  ALBUMIN 3.3*   No results found for this basename: LIPASE, AMYLASE,  in the last 168 hours No results found for this basename: AMMONIA,  in the last 168 hours CBC:  Recent Labs Lab 07/23/13 0340 07/23/13 0401 07/23/13 1130 07/24/13 0830 07/25/13 0555 07/26/13 0456  WBC 10.2  --  7.3 8.2 6.4 6.5  NEUTROABS  --   --  5.2  --   --   --   HGB 15.2 16.0 13.8 14.3 13.1 13.1  HCT 44.3 47.0 40.3 42.1 38.6* 38.0*  MCV 94.9  --  95.0 96.3 94.8 95.0  PLT 109*  --  94* 83* 93* 80*   Cardiac Enzymes:  Recent Labs Lab 07/23/13 0340 07/23/13 1130 07/23/13 1639 07/23/13 2218  TROPONINI 0.72* 3.91* 3.19* 3.15*   BNP (last 3 results) No results found for this basename: PROBNP,  in the last 8760 hours CBG: No results found for this basename: GLUCAP,  in the last 168 hours  Recent Results (from the past 240 hour(s))  CULTURE, RESPIRATORY (NON-EXPECTORATED)     Status: None   Collection Time  07/23/13  7:03 AM      Result Value Range Status   Specimen Description SPUTUM   Final   Special Requests Normal   Final   Gram Stain     Final   Value: FEW WBC PRESENT, PREDOMINANTLY PMN     FEW SQUAMOUS EPITHELIAL CELLS PRESENT     FEW GRAM POSITIVE COCCI IN PAIRS     IN CHAINS RARE GRAM NEGATIVE RODS     Performed at [facility]   Culture     Final   Value: NORMAL OROPHARYNGEAL FLORA     Performed at [facility]   Report Status 07/25/2013 FINAL   Final     Studies: No results found.  Scheduled Meds: . [facility] HOLD] aspirin EC  325 mg Oral Daily  . diazepam  2 mg Oral On Call  . [MAR HOLD] dorzolamide  1 drop Both Eyes BID   . [facility] HOLD] ezetimibe  10 mg Oral Daily  . [facility] HOLD] guaiFENesin  600 mg Oral BID  . [MAR HOLD] latanoprost  1 drop Both Eyes QHS  . [facility] HOLD] levofloxacin  750 mg Oral Q1200  . [facility] HOLD] metoprolol succinate  50 mg Oral Daily  . [MAR HOLD] niacin  1,000 mg Oral QHS  . [facility] HOLD] pneumococcal 23 valent vaccine  0.5 mL Intramuscular Tomorrow-1000  . [MAR HOLD] ramipril  5 mg Oral Daily  . [MAR HOLD] sodium chloride  3 mL Intravenous Q12H  . [facility] HOLD] sodium chloride  3 mL Intravenous Q12H  . sodium chloride  3 mL Intravenous Q12H   Continuous Infusions: . heparin 1,150 Units/hr (07/25/13 2003)    Time Spent: 25 minutes   Principal Problem:   Community acquired pneumonia Active Problems:   HYPERTENSION   AORTIC STENOSIS   Non-ST elevation MI (NSTEMI)   Hemoptysis   Pneumonia  [name] [facility]  [facility] Pager [phone]. If 7PM-7AM, please contact night-coverage at [email or web address], password [facility] 07/26/2013, 11:18 AM  LOS: 3 days

## 2013-07-26 NOTE — H&P (View-Only) (Signed)
     SUBJECTIVE: No chest pain. Breathing is ok. Still having some blood tinged sputum production.   BP 161/86  Pulse 76  Temp(Src) 98.1 F (36.7 C) (Oral)  Resp 18  Ht 5' 8" (1.727 m)  Wt 143 lb 3.2 oz (64.955 kg)  BMI 21.78 kg/m2  SpO2 100%  Intake/Output Summary (Last 24 hours) at 07/25/13 1058 Last data filed at 07/24/13 2100  Gross per 24 hour  Intake    360 ml  Output    375 ml  Net    -15 ml    PHYSICAL EXAM General: Well developed, well nourished, in no acute distress. Alert and oriented x 3.  Psych:  Good affect, responds appropriately Neck: No JVD. No masses noted.  Lungs: Clear bilaterally with no wheezes or rhonci noted.  Heart: RRR with systolic murmur noted.  Abdomen: Bowel sounds are present. Soft, non-tender.  Extremities: No lower extremity edema.   LABS: Basic Metabolic Panel:  Recent Labs  07/23/13 1130 07/24/13 0830  NA 144 145  K 4.1 3.8  CL 105 106  CO2 25 23  GLUCOSE 103* 90  BUN 12 11  CREATININE 0.74 0.82  CALCIUM 8.5 8.9   CBC:  Recent Labs  07/23/13 0401 07/23/13 1130 07/24/13 0830 07/25/13 0555  WBC  --  7.3 8.2 6.4  NEUTROABS  --  5.2  --   --   HGB 16.0 13.8 14.3 13.1  HCT 47.0 40.3 42.1 38.6*  MCV  --  95.0 96.3 94.8  PLT  --  94* 83* 93*   Cardiac Enzymes:  Recent Labs  07/23/13 1130 07/23/13 1639 07/23/13 2218  TROPONINI 3.91* 3.19* 3.15*    Current Meds: . aspirin EC  325 mg Oral Daily  . dorzolamide  1 drop Both Eyes BID  . ezetimibe  10 mg Oral Daily  . guaiFENesin  600 mg Oral BID  . latanoprost  1 drop Both Eyes QHS  . levofloxacin  750 mg Oral Q1200  . metoprolol succinate  50 mg Oral Daily  . niacin  1,000 mg Oral QHS  . pneumococcal 23 valent vaccine  0.5 mL Intramuscular Tomorrow-1000  . ramipril  5 mg Oral Daily  . sodium chloride  3 mL Intravenous Q12H  . sodium chloride  3 mL Intravenous Q12H    ASSESSMENT AND PLAN:  1. Pneumonia: Clinically improving on antibiotics. Still with mild  blood tinged sputum. Cough improving.   2. Elevated troponin: Likely demand ischemia but we have never looked for CAD. He has not had a prior cardiac cath. Given his worsened aortic valve stenosis and elevated troponin, will plan right and left heart cath tomorrow. He has not chest pain and has had no angina at home.  Continue IV heparin until after cath.   3. Aortic valve stenosis: Severe/critical by echo this admission. Mean gradient 52 mm Hg. He has had no symptoms due to his aortic stenosis. I have had a long discussion with the patient and his wife regarding the natural progression of aortic valve stenosis. He will most certainly need to have this addressed in the near future.  Plan right and left heart cath tomorrow. Will follow with Dr. Crenshaw after discharge.    [name]  1/27/201510:58 AM  

## 2013-07-26 NOTE — Progress Notes (Signed)
Pt and wife has watch the cath video-----Cassadi Purdie, rn

## 2013-07-26 NOTE — Interval H&P Note (Signed)
History and Physical Interval Note:  07/26/2013 8:20 AM  [name]  has presented today for surgery, with the diagnosis of as  The various methods of treatment have been discussed with the patient and family. After consideration of risks, benefits and other options for treatment, the patient has consented to  Procedure(s): LEFT AND RIGHT HEART CATHETERIZATION WITH CORONARY ANGIOGRAM (N/A) as a surgical intervention .  The patient's history has been reviewed, patient examined, no change in status, stable for surgery.  I have reviewed the patient's chart and labs.  Questions were answered to the patient's satisfaction.    Procedure was rediscussed with the patient and wife. Return to define coronary anatomy as a prelude to possible aortic valve replacement. The procedure and risks of stroke, death, myocardial infarction, bleeding, limb ischemia, kidney failure, among others were discussed in detail and except to Sinclair Grooms

## 2013-07-26 NOTE — CV Procedure (Signed)
     Left and Right Heart Catheterization with Coronary Angiography  Report  [name]  80 y.o.  male 04/30/1930  Procedure Date: 07/26/2013  Referring Physician: C. Angelena Form, MD Primary Cardiologist:: Kirk Ruths, M.D. Primary Care Physician: Redge Gainer, M.D.  INDICATIONS: Non-ST elevation myocardial infarction and aortic stenosis  PROCEDURE: 1. Left heart catheterization; 2. Right heart catheterization; 3. Coronary angiography; 4. Left ventriculography  CONSENT:  The risks, benefits, and details of the procedure were explained in detail to the patient. Risks including death, stroke, heart attack, kidney injury, allergy, limb ischemia, bleeding and radiation injury were discussed.  The patient verbalized understanding and wanted to proceed.  Informed written consent was obtained.  PROCEDURE TECHNIQUE:  After Xylocaine anesthesia a 5 French sheath was placed in the right femoral artery with a single anterior needle wall stick. A 7 French sheath was placed in the right femoral vein also with a single anterior wall stick. Right heart catheterization was performed with a 6 French Swan-Ganz catheter. Coronary angiography was done using a left and right 4 cm Judkins catheters.  Left ventriculography was done using the 5 [place] JR 4 diagnostic catheter using hand injection.   Hemostasis was achieved with manual compression.  MEDICATIONS: Versed 1 mg IV and fentanyl 50 mcg IV  CONTRAST:  Total of 100 cc.  COMPLICATIONS:  none   HEMODYNAMICS:  Aortic pressure 114/45 mmHg; LV pressure 170/13 mmHg; LVEDP 10 mmHg, RA 4 mmHg mean, RV 37/5 mmHg, PA 34/14 mmHg, PCWP(mean) 6 mmHg , Cardiac Output 3.13 L per minute by thermodilution and 4.56 L per minute using Fick; AV gradient peak to peak 65 mmHg with a mean gradient of 56 mm mercury; aortic valve area using thermodilution cardiac output is 0.42 cm square and 0.61 cm square using the Fick cardiac output  ANGIOGRAPHIC DATA:   The left main  coronary artery is short and free of significant obstruction. Heavy calcification is noted. No catheter damping is noted with engagement..  The left anterior descending artery is proximal mid and distal eccentric 50% stenoses. No high-grade obstruction is noted. A large diagonal vessel is also free of obstruction..  The left circumflex artery is large and widely patent.  The right coronary artery is dominant and normal..   LEFT VENTRICULOGRAM:  Left ventricular angiogram was done in the 30 RAO projection and revealed normal cavity size with normal function and estimated ejection fraction of 60%  CINEFLUOROSCOPY AORTIC VALVE: heavy leaflet calcification, mitral annular calcification, and mild aortic root calcification.  IMPRESSIONS:  1. Critical aortic stenosis, calcific. 2. Mild to moderate left anterior descending coronary artery disease. No significant obstruction is present. The left main is calcified but has no significant obstruction. The right coronary and circumflex are widely patent and essentially normal for age 80. Normal left ventricular systolic function   RECOMMENDATION:  Consideration of aortic valve surgery or TVR depending upon clinical features and heart valve team opinion.

## 2013-07-27 LAB — CBC
HEMATOCRIT: 35.2 % — AB (ref 39.0–52.0)
HEMOGLOBIN: 12.1 g/dL — AB (ref 13.0–17.0)
MCH: 32.8 pg (ref 26.0–34.0)
MCHC: 34.4 g/dL (ref 30.0–36.0)
MCV: 95.4 fL (ref 78.0–100.0)
Platelets: 84 10*3/uL — ABNORMAL LOW (ref 150–400)
RBC: 3.69 MIL/uL — ABNORMAL LOW (ref 4.22–5.81)
RDW: 14.4 % (ref 11.5–15.5)
WBC: 5.3 10*3/uL (ref 4.0–10.5)

## 2013-07-27 MED ORDER — LEVOFLOXACIN 750 MG PO TABS: 750.0000 mg | ORAL_TABLET | Freq: Every day | ORAL | Status: DC

## 2013-07-27 NOTE — Discharge Summary (Signed)
Physician Discharge Summary  [name] [id number] DOB: [birth year] DOA: 07/23/2013  PCP: Redge Gainer, MD  Admit date: 07/23/2013 Discharge date: 07/27/2013  Time spent: 45 minutes  Recommendations for Outpatient Follow-up:  -Will be discharged home today. -Advised to follow up with Dr. Stanford Breed in about 2 weeks, to determine appropriate timing for AVR.   Discharge Diagnoses:  Principal Problem:   Community acquired pneumonia Active Problems:   HYPERTENSION   AORTIC STENOSIS   Non-ST elevation MI (NSTEMI)   Hemoptysis   Pneumonia   Discharge Condition: Stable and improved  Filed Weights   07/25/13 0440 07/26/13 0436 07/27/13 0649  Weight: 64.955 kg (143 lb 3.2 oz) 64.9 kg (143 lb 1.3 oz) 66 kg (145 lb 8.1 oz)    History of present illness:  [name] is a 80 y.o. male history of severe aortic stenosis, hypertension, hyperlipidemia and previous history of stroke presented to the ER because of sudden onset of shortness of breath with hemoptysis. Patient states that since December 30 24 the patient has been having productive cough and initially had fever chills and flulike symptoms but patient was only taking some home remedies. Patient's cough was persistent but last night became worse with some chest tightness and hemoptysis. In the ER initial x-rays were concerning for alveolar hemorrhage. CT chest shows features concerning for bronchopneumonia. Patient's troponin was positive and EKG were showing LBBB which patient has known history of LBBB. On-call cardiologist was consulted. At this time [facility] will be [facility] as there is no bed at [facility]. Patient presently is not in acute distress. Denies any nausea vomiting abdominal pain. Denies any recent travel or sick contacts. Hospitalist admission was requested.   Hospital Course:   1. Community-acquired pneumonia - patient has been started on Levaquin and clinically has improved.  Influenza PCR negative.  Sputum culture with normal OP flora. All cx data remains negative to date. 5 days of levaquin remaining on DC. 2. Non-STEMI - Had a cardiac cath with results as below, but essentially non-obstructive CAD. 3. Severe aortic stenosis - repeat echocardiogram reviewed. Per cardiology, plan is to let his CAP resolve and then refer as an OP for consideration of AVR. 4. Hemoptysis - Resolved. Hb stable. Hypertension - continue home medications. 5. Hyperlipidemia - intolerant to statins. On Zetia.   Procedures: Cardiac Cath 07/26/13:  . Critical aortic stenosis, calcific.  2. Mild to moderate left anterior descending coronary artery disease. No significant obstruction is present. The left main is calcified but has no significant obstruction. The right coronary and circumflex are widely patent and essentially normal for age  83. Normal left ventricular systolic function    Consultations:  Cardiology  Discharge Instructions  Discharge Orders   Future Orders Complete By Expires   Diet - low sodium heart healthy  As directed    Discontinue IV  As directed    Increase activity slowly  As directed        Medication List         ALKA-SELTZER PLS SINUS & COUGH 10-5-325 MG Caps  Generic drug:  DM-Phenylephrine-Acetaminophen  Take 1 packet by mouth every 6 (six) hours as needed (cold/sinus).     aspirin 325 MG EC tablet  Take 325 mg by mouth daily.     dorzolamide 2 % ophthalmic solution  Commonly known as:  TRUSOPT  Place 1 drop into both eyes 2 (two) times daily.     guaiFENesin 600 MG 12 hr tablet  Commonly known as:  MUCINEX  Take 600 mg by mouth 2 (two) times daily.     guaifenesin 100 MG/5ML syrup  Commonly known as:  ROBITUSSIN  Take 200 mg by mouth 3 (three) times daily as needed for cough.     latanoprost 0.005 % ophthalmic solution  Commonly known as:  XALATAN  Place 1 drop into both eyes at bedtime.     levofloxacin 750 MG tablet  Commonly known as:  LEVAQUIN  Take 1  tablet (750 mg total) by mouth daily at 12 noon. For 5 more days     metoprolol succinate 50 MG 24 hr tablet  Commonly known as:  TOPROL-XL  TAKE 1 TABLET EVERY DAY     niacin 1000 MG CR tablet  Commonly known as:  NIASPAN  TAKE 1 TABLET BY MOUTH AT BEDTIME     ramipril 5 MG capsule  Commonly known as:  ALTACE  TAKE ONE CAPSULE BY MOUTH EVERY DAY     ZETIA 10 MG tablet  Generic drug:  ezetimibe  TAKE 1 TABLET EVERY DAY       No Known Allergies     Follow-up Information   Follow up with Redge Gainer, MD. Schedule an appointment as soon as possible for a visit in 2 weeks.   Specialty:  Family Medicine   Contact information:   [address] [place] [address] [phone]       Follow up with Kirk Ruths, MD. Schedule an appointment as soon as possible for a visit in 10 days.   Specialty:  Cardiology   Contact information:   [id number] [place] [address] [phone]        The results of significant diagnostics from this hospitalization (including imaging, microbiology, ancillary and laboratory) are listed below for reference.    Significant Diagnostic Studies: Ct Angio Chest Pe W/cm &/or Wo Cm  07/23/2013   CLINICAL DATA:  Shortness of breath and hemoptysis.  EXAM: CT ANGIOGRAPHY CHEST WITH CONTRAST  TECHNIQUE: Multidetector CT imaging of the chest was performed using the standard protocol during bolus administration of intravenous contrast. Multiplanar CT image reconstructions including MIPs were obtained to evaluate the vascular anatomy.  CONTRAST:  132mL OMNIPAQUE IOHEXOL 350 MG/ML SOLN  COMPARISON:  Chest radiograph July 23, 2013 at 400 hr and chest radiograph report April 19, 1999 though images are not available for direct comparison.  FINDINGS: Adequate bolus timing. Main pulmonary artery is not enlarged. No pulmonary arterial filling defects to the level of the subsegmental branches.  Heart is mildly enlarged, small pericardial  effusion. Thoracic aorta is normal in course and caliber with mild calcific atherosclerosis.  Markedly elevated left hemidiaphragm (described on prior chest radiograph April 19, 1999), with suspected wide neck hernia containing the majority of the stomach as well as the colonic splenic flexure.  Patchy ground-glass opacities with centrilobular nodules in the right upper lobe, per predominately in the apical and posterior segments, with similar findings in the right lower lobe and to lesser extent right middle lobe. Minimal interstitial prominence and atelectasis in left lung base. Small right pleural effusions. Tracheobronchial tree is patent, with trace pericardial wall thickening. No pneumothorax.  1 cm cyst in the hepatic dome. Soft tissues are nonsuspicious. 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  Review of the MIP images confirms the above findings. Stomach and large bowel within the hernia sac without bowel obstruction.  IMPRESSION: No pulmonary embolism.  Mild bronchial wall thickening  in addition to airspace opacities throughout the right lung highly concerning for bronchopneumonia with small right pleural effusion. Recommend followup imaging to verify improvement after treatment.  Markedly elevated left hemidiaphragm with suspected chronic hernia, no bowel obstruction in the included view.  Preliminary findings discussed with and reconfirmed by Dr. Marnette [name] on July 23, 2013 at approximately 0620 hr.   Electronically Signed   By: Elon Alas   On: 07/23/2013 06:30   Dg Chest Portable 1 View  07/23/2013   CLINICAL DATA:  Chest pain and shortness of breath.  Hemoptysis.  EXAM: PORTABLE CHEST - 1 VIEW  COMPARISON:  None.  FINDINGS: Diffuse right-sided airspace opacification is seen. Dense left basilar airspace opacification is noted. There is elevation of the left hemidiaphragm. No definite pleural effusion or pneumothorax is seen.  The cardiomediastinal silhouette is  mildly enlarged. No acute osseous abnormalities are identified.  IMPRESSION: 1. Diffuse right-sided airspace opacification and dense left basilar airspace opacity. Though this could reflect multifocal pneumonia, diffuse right-sided alveolar hemorrhage could have a similar appearance, given the patient's hemoptysis. Would correlate with the extent of the patient's symptoms. 2. Mild cardiomegaly.   Electronically Signed   By: Garald Balding M.D.   On: 07/23/2013 04:44    Microbiology: Recent Results (from the past 240 hour(s))  CULTURE, RESPIRATORY (NON-EXPECTORATED)     Status: None   Collection Time    07/23/13  7:03 AM      Result Value Range Status   Specimen Description SPUTUM   Final   Special Requests Normal   Final   Gram Stain     Final   Value: FEW WBC PRESENT, PREDOMINANTLY PMN     FEW SQUAMOUS EPITHELIAL CELLS PRESENT     FEW GRAM POSITIVE COCCI IN PAIRS     IN CHAINS RARE GRAM NEGATIVE RODS     Performed at [facility]   Culture     Final   Value: NORMAL OROPHARYNGEAL FLORA     Performed at [facility]   Report Status 07/25/2013 FINAL   Final     Labs: Basic Metabolic Panel:  Recent Labs Lab 07/23/13 0401 07/23/13 1130 07/24/13 0830 07/26/13 0456 07/26/13 1500  NA 142 144 145 146  --   K 3.7 4.1 3.8 3.9  --   CL 102 105 106 110  --   CO2  --  25 23 23   --   GLUCOSE 124* 103* 90 119*  --   BUN 15 12 11 9   --   CREATININE 0.90 0.74 0.82 0.96 0.80  CALCIUM  --  8.5 8.9 8.4  --    Liver Function Tests:  Recent Labs Lab 07/23/13 1130  AST 33  ALT 12  ALKPHOS 44  BILITOT 0.7  PROT 6.0  ALBUMIN 3.3*   No results found for this basename: LIPASE, AMYLASE,  in the last 168 hours No results found for this basename: AMMONIA,  in the last 168 hours CBC:  Recent Labs Lab 07/23/13 0401 07/23/13 1130 07/24/13 0830 07/25/13 0555 07/26/13 0456 07/26/13 1500 07/27/13 0508  WBC  --  7.3 8.2 6.4 6.5 4.5 5.3  NEUTROABS  --  5.2  --   --   --    --   --   HGB 16.0 13.8 14.3 13.1 13.1 12.9* 12.1*  HCT 47.0 40.3 42.1 38.6* 38.0* 38.1* 35.2*  MCV  --  95.0 96.3 94.8 95.0 96.0 95.4  PLT  --  94* 83* 93* 80*  89* 84*   Cardiac Enzymes:  Recent Labs Lab 07/23/13 0340 07/23/13 1130 07/23/13 1639 07/23/13 2218  TROPONINI 0.72* 3.91* 3.19* 3.15*   BNP: BNP (last 3 results) No results found for this basename: PROBNP,  in the last 8760 hours CBG:  Recent Labs Lab 07/26/13 2144  GLUCAP 146*       Signed:  HERNANDEZ ACOSTA,ESTELA  [facility] Pager: [phone] 07/27/2013, 2:08 PM

## 2013-07-27 NOTE — Progress Notes (Signed)
SUBJECTIVE: No chest pain or SOB. Cough is better.   BP 98/44  Pulse 77  Temp(Src) 97.7 F (36.5 C) (Oral)  Resp 18  Ht 5\' 8"  (1.727 m)  Wt 145 lb 8.1 oz (66 kg)  BMI 22.13 kg/m2  SpO2 99%  Intake/Output Summary (Last 24 hours) at 07/27/13 0935 Last data filed at 07/27/13 0617  Gross per 24 hour  Intake 671.25 ml  Output    100 ml  Net 571.25 ml    PHYSICAL EXAM General: Well developed, well nourished, in no acute distress. Alert and oriented x 3.  Psych:  Good affect, responds appropriately Neck: No JVD. No masses noted.  Lungs: Clear bilaterally with no wheezes or rhonci noted.  Heart: RRR with harsh systolic murmur noted.  Abdomen: Bowel sounds are present. Soft, non-tender.  Extremities: No lower extremity edema. Right groin without hematoma.   LABS: Basic Metabolic Panel:  Recent Labs  07/26/13 0456 07/26/13 1500  NA 146  --   K 3.9  --   CL 110  --   CO2 23  --   GLUCOSE 119*  --   BUN 9  --   CREATININE 0.96 0.80  CALCIUM 8.4  --    CBC:  Recent Labs  07/26/13 1500 07/27/13 0508  WBC 4.5 5.3  HGB 12.9* 12.1*  HCT 38.1* 35.2*  MCV 96.0 95.4  PLT 89* 84*   Current Meds: . aspirin EC  325 mg Oral Daily  . dorzolamide  1 drop Both Eyes BID  . ezetimibe  10 mg Oral Daily  . guaiFENesin  600 mg Oral BID  . heparin  5,000 Units Subcutaneous Q8H  . latanoprost  1 drop Both Eyes QHS  . levofloxacin  750 mg Oral Q1200  . metoprolol succinate  50 mg Oral Daily  . niacin  1,000 mg Oral QHS  . pneumococcal 23 valent vaccine  0.5 mL Intramuscular Tomorrow-1000  . ramipril  5 mg Oral Daily   Cardiac cath 07/26/13:  HEMODYNAMICS: Aortic pressure 114/45 mmHg; LV pressure 170/13 mmHg; LVEDP 10 mmHg, RA 4 mmHg mean, RV 37/5 mmHg, PA 34/14 mmHg, PCWP(mean) 6 mmHg , Cardiac Output 3.13 L per minute by thermodilution and 4.56 L per minute using Fick; AV gradient peak to peak 65 mmHg with a mean gradient of 56 mm mercury; aortic valve area using  thermodilution cardiac output is 0.42 cm square and 0.61 cm square using the Fick cardiac output  ANGIOGRAPHIC DATA: The left main coronary artery is short and free of significant obstruction. Heavy calcification is noted. No catheter damping is noted with engagement..  The left anterior descending artery is proximal mid and distal eccentric 50% stenoses. No high-grade obstruction is noted. A large diagonal vessel is also free of obstruction..  The left circumflex artery is large and widely patent.  The right coronary artery is dominant and normal..  LEFT VENTRICULOGRAM: Left ventricular angiogram was done in the 30 RAO projection and revealed normal cavity size with normal function and estimated ejection fraction of 60%   ASSESSMENT AND PLAN:  1. Aortic valve stenosis: Severe/critical by echo this admission. Mean gradient 52 mm Hg on echo.Right and left heart cath 07/26/13 with non-obstructive CAD. 65 mm peak to peak gradient across AV by cath with mean gradient of 56 mm Hg. He has had no symptoms due to his aortic stenosis at this time. I have had a long discussion with the patient and his wife regarding the natural  progression of aortic valve stenosis with associated symptoms. He will most certainly need to have this addressed in the near future. Since he is asymptomatic at this time, OK to d/c home from cardiac standpoint. He will have close f/u with Dr. Stanford Breed in our [facility] office after discharge. Once he is completely recovered from pneumonia, will refer to see CT surgery in their office for discussion regarding AVR. He appears to overall be a favorable candidate for AVR. This will all be arranged as an outpatient.   2. Pneumonia: Clinically improving on antibiotics. Per primary team.   3. Elevated troponin: Likely demand ischemia in setting of pneumonia with severe AS. Cath yesterday with non-obstructive disease. He has not chest pain and has had no angina at home.    OK for d/c home  from cardiac standpoint.     MCALHANY,CHRISTOPHER  1/29/20159:35 AM

## 2013-07-31 ENCOUNTER — Ambulatory Visit (INDEPENDENT_AMBULATORY_CARE_PROVIDER_SITE_OTHER): Payer: Medicare Other | Admitting: Family Medicine

## 2013-07-31 ENCOUNTER — Encounter: Payer: Self-pay | Admitting: Family Medicine

## 2013-07-31 VITALS — BP 134/51 | HR 68 | Temp 98.8°F | Ht 68.0 in | Wt 148.0 lb

## 2013-07-31 DIAGNOSIS — L239 Allergic contact dermatitis, unspecified cause: Secondary | ICD-10-CM | POA: Insufficient documentation

## 2013-07-31 DIAGNOSIS — R21 Rash and other nonspecific skin eruption: Secondary | ICD-10-CM | POA: Insufficient documentation

## 2013-07-31 DIAGNOSIS — J189 Pneumonia, unspecified organism: Secondary | ICD-10-CM | POA: Insufficient documentation

## 2013-07-31 DIAGNOSIS — L259 Unspecified contact dermatitis, unspecified cause: Secondary | ICD-10-CM

## 2013-07-31 LAB — POCT CBC
Granulocyte percent: 78.4 %G (ref 37–80)
HCT, POC: 38.5 % — AB (ref 43.5–53.7)
Hemoglobin: 12.1 g/dL — AB (ref 14.1–18.1)
Lymph, poc: 1.3 (ref 0.6–3.4)
MCH, POC: 30.7 pg (ref 27–31.2)
MCHC: 31.4 g/dL — AB (ref 31.8–35.4)
MCV: 97.7 fL — AB (ref 80–97)
MPV: 6.8 fL (ref 0–99.8)
POC Granulocyte: 5.7 (ref 2–6.9)
POC LYMPH PERCENT: 18 %L (ref 10–50)
Platelet Count, POC: 118 10*3/uL — AB (ref 142–424)
RBC: 3.9 M/uL — AB (ref 4.69–6.13)
RDW, POC: 14.8 %
WBC: 7.3 10*3/uL (ref 4.6–10.2)

## 2013-07-31 MED ORDER — METHYLPREDNISOLONE ACETATE 80 MG/ML IJ SUSP
80.0000 mg | Freq: Once | INTRAMUSCULAR | Status: AC
Start: 2013-07-31 — End: 2013-07-31
  Administered 2013-07-31: 80 mg via INTRAMUSCULAR

## 2013-07-31 MED ORDER — CETIRIZINE HCL 10 MG PO TABS: 10.0000 mg | ORAL_TABLET | Freq: Every day | ORAL | Status: DC

## 2013-07-31 NOTE — Progress Notes (Signed)
Patient ID: [name], male   DOB: 1930/04/27, 80 y.o.   MRN: [id number] SUBJECTIVE: CC: Chief Complaint  Patient presents with  . Establish Care    STATES HAS BEEN SEEN HERE 5 YRS AGO. C/O RASH STARTED  LEVAQUIN STARTED ON FRIDAY . WAS SEEN IN HOSPITAL FOR PNEUMONIA     HPI: Was hospitalized for CAP. Was placed on Levaquin, has 2 days left to take it. He broke out into a red burning itching rash all over the trunk and arms and he is itching badly. No fever. Chest congestion better. Occasional cough. No hemoptysis, no wheeze, no SOB.  Past Medical History  Diagnosis Date  . Cerebrovascular disease, unspecified   . Aortic valve disorders     stenosis  . HLD (hyperlipidemia)   . HTN (hypertension)    Past Surgical History  Procedure Laterality Date  . Carotid endarterectomy     History   Social History  . Marital Status: Married    Spouse Name: N/A    Number of Children: N/A  . Years of Education: N/A   Occupational History  . Not on file.   Social History Main Topics  . Smoking status: Never Smoker   . Smokeless tobacco: Not on file     Comment: tobacco use - no  . Alcohol Use: No  . Drug Use: Not on file  . Sexual Activity: Not on file   Other Topics Concern  . Not on file   Social History Narrative   Married.    Family History  Problem Relation Age of Onset  . Colon cancer Father   . Heart failure Mother     CHF    Current Outpatient Prescriptions on File Prior to Visit  Medication Sig Dispense Refill  . aspirin 325 MG EC tablet Take 325 mg by mouth daily.        Marland Kitchen DM-Phenylephrine-Acetaminophen (ALKA-SELTZER PLS SINUS & COUGH) 10-5-325 MG CAPS Take 1 packet by mouth every 6 (six) hours as needed (cold/sinus).       . dorzolamide (TRUSOPT) 2 % ophthalmic solution Place 1 drop into both eyes 2 (two) times daily.        Marland Kitchen guaiFENesin (MUCINEX) 600 MG 12 hr tablet Take 600 mg by mouth 2 (two) times daily.      Marland Kitchen guaifenesin (ROBITUSSIN) 100 MG/5ML syrup  Take 200 mg by mouth 3 (three) times daily as needed for cough.      . latanoprost (XALATAN) 0.005 % ophthalmic solution Place 1 drop into both eyes at bedtime.        . metoprolol succinate (TOPROL-XL) 50 MG 24 hr tablet TAKE 1 TABLET EVERY DAY  90 tablet  3  . niacin (NIASPAN) 1000 MG CR tablet TAKE 1 TABLET BY MOUTH AT BEDTIME  90 tablet  3  . ramipril (ALTACE) 5 MG capsule TAKE ONE CAPSULE BY MOUTH EVERY DAY  90 capsule  3  . ZETIA 10 MG tablet TAKE 1 TABLET EVERY DAY  90 tablet  3  . levofloxacin (LEVAQUIN) 750 MG tablet Take 1 tablet (750 mg total) by mouth daily at 12 noon. For 5 more days  5 tablet  0   No current facility-administered medications on file prior to visit.   Allergies  Allergen Reactions  . Levaquin [Levofloxacin]     RASH   Immunization History  Administered Date(s) Administered  . Influenza,inj,Quad PF,36+ Mos 05/03/2013  . Pneumococcal Polysaccharide-23 07/27/2013   Prior to Admission medications  Medication Sig Start Date End Date Taking? Authorizing Provider  aspirin 325 MG EC tablet Take 325 mg by mouth daily.     Yes Historical Provider, MD  DM-Phenylephrine-Acetaminophen (ALKA-SELTZER PLS SINUS & COUGH) 10-5-325 MG CAPS Take 1 packet by mouth every 6 (six) hours as needed (cold/sinus).    Yes Historical Provider, MD  dorzolamide (TRUSOPT) 2 % ophthalmic solution Place 1 drop into both eyes 2 (two) times daily.     Yes Historical Provider, MD  dorzolamide-timolol (COSOPT) 22.3-6.8 MG/ML ophthalmic solution  06/26/13  Yes Historical Provider, MD  guaiFENesin (MUCINEX) 600 MG 12 hr tablet Take 600 mg by mouth 2 (two) times daily.   Yes Historical Provider, MD  guaifenesin (ROBITUSSIN) 100 MG/5ML syrup Take 200 mg by mouth 3 (three) times daily as needed for cough.   Yes Historical Provider, MD  latanoprost (XALATAN) 0.005 % ophthalmic solution Place 1 drop into both eyes at bedtime.     Yes Historical Provider, MD  metoprolol succinate (TOPROL-XL) 50 MG 24 hr  tablet TAKE 1 TABLET EVERY DAY 06/05/13  Yes Lelon Perla, MD  niacin (NIASPAN) 1000 MG CR tablet TAKE 1 TABLET BY MOUTH AT BEDTIME 09/21/12  Yes Lelon Perla, MD  ramipril (ALTACE) 5 MG capsule TAKE ONE CAPSULE BY MOUTH EVERY DAY 06/05/13  Yes Lelon Perla, MD  ZETIA 10 MG tablet TAKE 1 TABLET EVERY DAY 06/05/13  Yes Lelon Perla, MD  levofloxacin (LEVAQUIN) 750 MG tablet Take 1 tablet (750 mg total) by mouth daily at 12 noon. For 5 more days 07/27/13   Erline Hau, MD     ROS: As above in the HPI. All other systems are stable or negative.  OBJECTIVE: APPEARANCE:  Patient in no acute distress.The patient appeared well nourished and normally developed. Acyanotic. Waist: VITAL SIGNS:BP 134/51  Pulse 68  Temp(Src) 98.8 F (37.1 C) (Oral)  Ht 5\' 8"  (1.727 m)  Wt 148 lb (67.132 kg)  BMI 22.51 kg/m2 WM  SKIN: warm and  Dry without , tattoos  Has a papular red eruptions all over th etrunk and arms and forearms and abdomen and prozimal thigh.  HEAD and Neck: without JVD, Head and scalp: normal Eyes:No scleral icterus. Fundi normal, eye movements normal. Ears: Auricle normal, canal normal, Tympanic membranes normal, insufflation normal. Nose: normal Throat: normal Neck & thyroid: normal  CHEST & LUNGS: Chest wall: normal Lungs: Coarse BS. Few crackles in RLL  CVS: Reveals the PMI to be normally located. Regular rhythm, First and Second Heart sounds are normal,  absence of murmurs, rubs or gallops. Peripheral vasculature: Radial pulses: normal Dorsal pedis pulses: normal Posterior pulses: normal  ABDOMEN:  Appearance: normal Benign, no organomegaly, no masses, no Abdominal Aortic enlargement. No Guarding , no rebound. No Bruits. Bowel sounds: normal  RECTAL: N/A GU: N/A  EXTREMETIES: nonedematous.  MUSCULOSKELETAL:  Spine: normal Joints: intact  NEUROLOGIC: oriented to time,place and person; nonfocal. Strength is normal Sensory is  normal Reflexes are normal Cranial Nerves are normal.  ASSESSMENT: Allergic dermatitis - suspect the levaquin  Rash and nonspecific skin eruption - Plan: POCT CBC, methylPREDNISolone acetate (DEPO-MEDROL) injection 80 mg  CAP (community acquired pneumonia) - Plan: POCT CBC  PLAN:  Orders Placed This Encounter  Procedures  . POCT CBC   Results for orders placed in visit on 07/31/13  POCT CBC      Result Value Range   WBC 7.3  4.6 - 10.2 K/uL   Lymph, poc 1.3  0.6 - 3.4   POC LYMPH PERCENT 18.0  10 - 50 %L   POC Granulocyte 5.7  2 - 6.9   Granulocyte percent 78.4  37 - 80 %G   RBC 3.9 (*) 4.69 - 6.13 M/uL   Hemoglobin 12.1 (*) 14.1 - 18.1 g/dL   HCT, POC 38.5 (*) 43.5 - 53.7 %   MCV 97.7 (*) 80 - 97 fL   MCH, POC 30.7  27 - 31.2 pg   MCHC 31.4 (*) 31.8 - 35.4 g/dL   RDW, POC 14.8     Platelet Count, POC 118.0 (*) 142 - 424 K/uL   MPV 6.8  0 - 99.8 fL    Meds ordered this encounter  Medications  . dorzolamide-timolol (COSOPT) 22.3-6.8 MG/ML ophthalmic solution    Sig:   . DISCONTD: TRAVATAN Z 0.004 % SOLN ophthalmic solution    Sig:   . cetirizine (ZYRTEC) 10 MG tablet    Sig: Take 1 tablet (10 mg total) by mouth daily.    Dispense:  30 tablet    Refill:  2  . methylPREDNISolone acetate (DEPO-MEDROL) injection 80 mg    Sig:    Medications Discontinued During This Encounter  Medication Reason  . TRAVATAN Z 0.004 % SOLN ophthalmic solution Duplicate   Return in about 1 week (around 08/07/2013) for Recheck medical problems.  In view of stopping antibiotics 2 days early. To reassess his needs.  Nabeel Gladson P. Jacelyn Grip, M.D.

## 2013-07-31 NOTE — Progress Notes (Signed)
PT TOLERATED DEPO MEDROL INJECTION WITHOUT DIFFICULTY

## 2013-07-31 NOTE — Patient Instructions (Signed)
Methylprednisolone Suspension for Injection What is this medicine? METHYLPREDNISOLONE (meth ill pred NISS oh lone) is a corticosteroid. It is commonly used to treat inflammation of the skin, joints, lungs, and other organs. Common conditions treated include asthma, allergies, and arthritis. It is also used for other conditions, such as blood disorders and diseases of the adrenal glands. This medicine may be used for other purposes; ask your health care provider or pharmacist if you have questions. COMMON BRAND NAME(S): Depmedalone-40, Depmedalone-80 , Depo-Medrol What should I tell my health care provider before I take this medicine? They need to know if you have any of these conditions: -cataracts or glaucoma -Cushings -heart disease -high blood pressure -infection including tuberculosis -low calcium or potassium levels in the blood -recent surgery -seizures -stomach or intestinal disease, including colitis -threadworms -thyroid problems -an unusual or allergic reaction to methylprednisolone, corticosteroids, benzyl alcohol, other medicines, foods, dyes, or preservatives -pregnant or trying to get pregnant -breast-feeding How should I use this medicine? This medicine is for injection into a muscle, joint, or other tissue. It is given by a health care professional in a hospital or clinic setting. Talk to your pediatrician regarding the use of this medicine in children. While this drug may be prescribed for selected conditions, precautions do apply. Overdosage: If you think you have taken too much of this medicine contact a poison control center or emergency room at once. NOTE: This medicine is only for you. Do not share this medicine with others. What if I miss a dose? This does not apply. What may interact with this medicine? Do not take this medicine with any of the following medications: -mifepristone This medicine may also interact with the following medications: -aspirin and  aspirin-like medicines -cyclosporin -ketoconazole -phenobarbital -phenytoin -rifampin -tacrolimus -troleandomycin -vaccines -warfarin This list may not describe all possible interactions. Give your health care provider a list of all the medicines, herbs, non-prescription drugs, or dietary supplements you use. Also tell them if you smoke, drink alcohol, or use illegal drugs. Some items may interact with your medicine. What should I watch for while using this medicine? Visit your doctor or health care professional for regular checks on your progress. If you are taking this medicine for a long time, carry an identification card with your name and address, the type and dose of your medicine, and your doctor's name and address. The medicine may increase your risk of getting an infection. Stay away from people who are sick. Tell your doctor or health care professional if you are around anyone with measles or chickenpox. You may need to avoid some vaccines. Talk to your health care provider for more information. If you are going to have surgery, tell your doctor or health care professional that you have taken this medicine within the last twelve months. Ask your doctor or health care professional about your diet. You may need to lower the amount of salt you eat. The medicine can increase your blood sugar. If you are a diabetic check with your doctor if you need help adjusting the dose of your diabetic medicine. What side effects may I notice from receiving this medicine? Side effects that you should report to your doctor or health care professional as soon as possible: -allergic reactions like skin rash, itching or hives, swelling of the face, lips, or tongue -bloody or tarry stools -changes in vision -eye pain or bulging eyes -fever, sore throat, sneezing, cough, or other signs of infection, wounds that will not heal -increased thirst -irregular  heartbeat -muscle cramps -pain in hips, back,  ribs, arms, shoulders, or legs -swelling of the ankles, feet, hands -trouble passing urine or change in the amount of urine -unusual bleeding or bruising -unusually weak or tired -weight gain or weight loss Side effects that usually do not require medical attention (report to your doctor or health care professional if they continue or are bothersome): -changes in emotions or moods -constipation or diarrhea -headache -irritation at site where injected -nausea, vomiting -skin problems, acne, thin and shiny skin -trouble sleeping -unusual hair growth on the face or body This list may not describe all possible side effects. Call your doctor for medical advice about side effects. You may report side effects to FDA at 1-800-FDA-1088. Where should I keep my medicine? This drug is given in a hospital or clinic and will not be stored at home. NOTE: This sheet is a summary. It may not cover all possible information. If you have questions about this medicine, talk to your doctor, pharmacist, or health care provider.  2014, [employer]. (2012-03-15 11:37:56)  

## 2013-08-03 ENCOUNTER — Telehealth: Payer: Self-pay | Admitting: Cardiology

## 2013-08-03 NOTE — Telephone Encounter (Signed)
New message  Patient had cath done on 07/26/13, hes swelling in his leg and foot. Please call and advise.

## 2013-08-03 NOTE — Telephone Encounter (Signed)
Spoke with pt wife, today they noticed the right leg and ankle are swollen. Pt has been walking in the house, denies SOB or pain in the leg. No redness, streaking or areas of increased heat on the leg or foot. There is a bruise on the ankle. The right leg is the one used for his recent cath. He will keep the leg elevated and I will discuss with dr Stanford Breed

## 2013-08-03 NOTE — Telephone Encounter (Signed)
Discussed with dr Stanford Breed, pt will come see lori gerhardt np to check leg and make sure no DVT

## 2013-08-04 ENCOUNTER — Telehealth: Payer: Self-pay | Admitting: Cardiology

## 2013-08-04 ENCOUNTER — Ambulatory Visit: Payer: Medicare Other | Admitting: Nurse Practitioner

## 2013-08-04 NOTE — Telephone Encounter (Signed)
New Message  Pt wife called. States that they made an appointment because the pt's legs were swollen badly. She states that the pt woke up this morning and his legs were not swollen "as much". Pt requests a call back to determine if the appt today is needed. Please advise.

## 2013-08-04 NOTE — Telephone Encounter (Signed)
Spoke with pt wife, the pt slept with his leg elevated and it is much better today. The pt feels he does not need to come in today. appt canceled.

## 2013-08-07 ENCOUNTER — Ambulatory Visit (INDEPENDENT_AMBULATORY_CARE_PROVIDER_SITE_OTHER): Payer: Medicare Other | Admitting: Family Medicine

## 2013-08-07 ENCOUNTER — Encounter: Payer: Self-pay | Admitting: Family Medicine

## 2013-08-07 VITALS — BP 148/53 | HR 64 | Temp 97.6°F | Ht 68.0 in | Wt 142.8 lb

## 2013-08-07 DIAGNOSIS — J189 Pneumonia, unspecified organism: Secondary | ICD-10-CM

## 2013-08-07 DIAGNOSIS — L259 Unspecified contact dermatitis, unspecified cause: Secondary | ICD-10-CM

## 2013-08-07 DIAGNOSIS — L239 Allergic contact dermatitis, unspecified cause: Secondary | ICD-10-CM

## 2013-08-07 NOTE — Progress Notes (Signed)
Patient ID: [name], male   DOB: 1929-08-07, 80 y.o.   MRN: [id number] SUBJECTIVE: CC: Chief Complaint  Patient presents with  . Follow-up    reck rash  ck right ankle had swelling this am soreness rt upper thiigh from "catherization"    HPI: Rash is improved significantly. Some are still present. But it is resolving. Breathing is fine. No cough, no chest congestion no fever.  he recently was treated for CAP and had not complete the levaquin due to allergic reaction. Wife thinks he has not cleared the rash a 100% but definitely improved and  Doing well.  Past Medical History  Diagnosis Date  . Cerebrovascular disease, unspecified   . Aortic valve disorders     stenosis  . HLD (hyperlipidemia)   . HTN (hypertension)    Past Surgical History  Procedure Laterality Date  . Carotid endarterectomy     History   Social History  . Marital Status: Married    Spouse Name: N/A    Number of Children: N/A  . Years of Education: N/A   Occupational History  . Not on file.   Social History Main Topics  . Smoking status: Never Smoker   . Smokeless tobacco: Not on file     Comment: tobacco use - no  . Alcohol Use: No  . Drug Use: Not on file  . Sexual Activity: Not on file   Other Topics Concern  . Not on file   Social History Narrative   Married.    Family History  Problem Relation Age of Onset  . Colon cancer Father   . Heart failure Mother     CHF    Current Outpatient Prescriptions on File Prior to Visit  Medication Sig Dispense Refill  . aspirin 325 MG EC tablet Take 325 mg by mouth daily.        . cetirizine (ZYRTEC) 10 MG tablet Take 1 tablet (10 mg total) by mouth daily.  30 tablet  2  . DM-Phenylephrine-Acetaminophen (ALKA-SELTZER PLS SINUS & COUGH) 10-5-325 MG CAPS Take 1 packet by mouth every 6 (six) hours as needed (cold/sinus).       . dorzolamide (TRUSOPT) 2 % ophthalmic solution Place 1 drop into both eyes 2 (two) times daily.        .  dorzolamide-timolol (COSOPT) 22.3-6.8 MG/ML ophthalmic solution       . guaiFENesin (MUCINEX) 600 MG 12 hr tablet Take 600 mg by mouth 2 (two) times daily.      Marland Kitchen guaifenesin (ROBITUSSIN) 100 MG/5ML syrup Take 200 mg by mouth 3 (three) times daily as needed for cough.      . latanoprost (XALATAN) 0.005 % ophthalmic solution Place 1 drop into both eyes at bedtime.        . metoprolol succinate (TOPROL-XL) 50 MG 24 hr tablet TAKE 1 TABLET EVERY DAY  90 tablet  3  . niacin (NIASPAN) 1000 MG CR tablet TAKE 1 TABLET BY MOUTH AT BEDTIME  90 tablet  3  . ramipril (ALTACE) 5 MG capsule TAKE ONE CAPSULE BY MOUTH EVERY DAY  90 capsule  3  . ZETIA 10 MG tablet TAKE 1 TABLET EVERY DAY  90 tablet  3   No current facility-administered medications on file prior to visit.   Allergies  Allergen Reactions  . Levaquin [Levofloxacin]     RASH   Immunization History  Administered Date(s) Administered  . Influenza,inj,Quad PF,36+ Mos 05/03/2013  . Pneumococcal Polysaccharide-23 07/27/2013   Prior  to Admission medications   Medication Sig Start Date End Date Taking? Authorizing Provider  aspirin 325 MG EC tablet Take 325 mg by mouth daily.     Yes Historical Provider, MD  cetirizine (ZYRTEC) 10 MG tablet Take 1 tablet (10 mg total) by mouth daily. 07/31/13  Yes Vernie Shanks, MD  DM-Phenylephrine-Acetaminophen (ALKA-SELTZER PLS SINUS & COUGH) 10-5-325 MG CAPS Take 1 packet by mouth every 6 (six) hours as needed (cold/sinus).    Yes Historical Provider, MD  dorzolamide (TRUSOPT) 2 % ophthalmic solution Place 1 drop into both eyes 2 (two) times daily.     Yes Historical Provider, MD  dorzolamide-timolol (COSOPT) 22.3-6.8 MG/ML ophthalmic solution  06/26/13  Yes Historical Provider, MD  guaiFENesin (MUCINEX) 600 MG 12 hr tablet Take 600 mg by mouth 2 (two) times daily.   Yes Historical Provider, MD  guaifenesin (ROBITUSSIN) 100 MG/5ML syrup Take 200 mg by mouth 3 (three) times daily as needed for cough.   Yes  Historical Provider, MD  latanoprost (XALATAN) 0.005 % ophthalmic solution Place 1 drop into both eyes at bedtime.     Yes Historical Provider, MD  metoprolol succinate (TOPROL-XL) 50 MG 24 hr tablet TAKE 1 TABLET EVERY DAY 06/05/13  Yes Lelon Perla, MD  niacin (NIASPAN) 1000 MG CR tablet TAKE 1 TABLET BY MOUTH AT BEDTIME 09/21/12  Yes Lelon Perla, MD  ramipril (ALTACE) 5 MG capsule TAKE ONE CAPSULE BY MOUTH EVERY DAY 06/05/13  Yes Lelon Perla, MD  ZETIA 10 MG tablet TAKE 1 TABLET EVERY DAY 06/05/13  Yes Lelon Perla, MD     ROS: As above in the HPI. All other systems are stable or negative.  OBJECTIVE: APPEARANCE:  Patient in no acute distress.The patient appeared well nourished and normally developed. Acyanotic. Waist: VITAL SIGNS:BP 148/53  Pulse 64  Temp(Src) 97.6 F (36.4 C) (Oral)  Ht 5\' 8"  (1.727 m)  Wt 142 lb 12.8 oz (64.774 kg)  BMI 21.72 kg/m2   SKIN: warm and  Dry with a few faint pink papular rash on the trunk and the forearms. The rash has improved and is resolving  HEAD and Neck: without JVD, Head and scalp: normal Eyes:No scleral icterus. Fundi normal, eye movements normal. Ears: Auricle normal, canal normal, Tympanic membranes normal, insufflation normal. Nose: normal Throat: normal Neck & thyroid: normal  CHEST & LUNGS: Chest wall: normal Lungs: Clear today  CVS: Reveals the PMI to be normally located. Regular rhythm, First and Second Heart sounds are normal,  2/6 systolic murmurs, no rubs nor gallops. Peripheral vasculature: Radial pulses: normal  ABDOMEN:  Appearance: normal Benign, no organomegaly, no masses, no Abdominal Aortic enlargement. No Guarding , no rebound. No Bruits. Bowel sounds: normal  RECTAL: N/A GU: N/A  EXTREMETIES: nonedematous.  NEUROLOGIC: oriented to time,place and person; nonfocal.   ASSESSMENT:  Allergic dermatitis  Community acquired pneumonia The rash is fading. He has responded to treatment  and the pneumonia has clinically resolved.  PLAN: Continue with the zyrtec until the rash has fully resolved No orders of the defined types were placed in this encounter.   No orders of the defined types were placed in this encounter.   Medications Discontinued During This Encounter  Medication Reason  . levofloxacin (LEVAQUIN) 750 MG tablet Completed Course   Return if symptoms worsen or fail to improve, for routine Primary Care at his convenience.Dub Mikes P. Jacelyn Grip, M.D.

## 2013-08-17 ENCOUNTER — Encounter: Payer: Self-pay | Admitting: Cardiology

## 2013-08-17 ENCOUNTER — Ambulatory Visit (HOSPITAL_COMMUNITY): Payer: Medicare Other | Attending: Cardiology

## 2013-08-17 ENCOUNTER — Ambulatory Visit (INDEPENDENT_AMBULATORY_CARE_PROVIDER_SITE_OTHER): Payer: Medicare Other | Admitting: Cardiology

## 2013-08-17 VITALS — BP 140/70 | HR 78 | Ht 68.0 in | Wt 140.0 lb

## 2013-08-17 DIAGNOSIS — I2510 Atherosclerotic heart disease of native coronary artery without angina pectoris: Secondary | ICD-10-CM

## 2013-08-17 DIAGNOSIS — R1909 Other intra-abdominal and pelvic swelling, mass and lump: Secondary | ICD-10-CM

## 2013-08-17 DIAGNOSIS — R0989 Other specified symptoms and signs involving the circulatory and respiratory systems: Secondary | ICD-10-CM

## 2013-08-17 DIAGNOSIS — I10 Essential (primary) hypertension: Secondary | ICD-10-CM | POA: Insufficient documentation

## 2013-08-17 DIAGNOSIS — M79609 Pain in unspecified limb: Secondary | ICD-10-CM | POA: Insufficient documentation

## 2013-08-17 DIAGNOSIS — I359 Nonrheumatic aortic valve disorder, unspecified: Secondary | ICD-10-CM

## 2013-08-17 DIAGNOSIS — I739 Peripheral vascular disease, unspecified: Secondary | ICD-10-CM | POA: Insufficient documentation

## 2013-08-17 DIAGNOSIS — D696 Thrombocytopenia, unspecified: Secondary | ICD-10-CM

## 2013-08-17 NOTE — Progress Notes (Signed)
HPI: FU cerebrovascular disease status post carotid endarterectomy, hypertension, hyperlipidemia, and aortic stenosis. Last carotid Dopplers in Oct 2013 showed 0-39% bilateral stenosis. Admitted in January of 2015 with pneumonia. Troponin elevated. Chest CT showed no pulmonary embolus. Echocardiogram in January 2015 showed an ejection fraction 60-65% and grade 1 diastolic dysfunction. There was severe aortic stenosis with a mean gradient of 52 mm of mercury. Cardiac catheterization in January of 2015 showed no obstructive disease in the left main. There was a 50% proximal, mid and distal LAD. No disease noted in the circumflex or right coronary artery. Ejection fraction 60%. It was felt he needed therapy for pneumonia and then referral for aortic valve replacement after he recovered. Patient did have thrombocytopenia during that admission. Since discharge, he has some dyspnea on exertion. No orthopnea or PND. No chest pain or syncope. He has felt some discomfort at previous catheterization site as well as ecchymosis in his right leg.   Current Outpatient Prescriptions  Medication Sig Dispense Refill  . aspirin 325 MG EC tablet Take 325 mg by mouth daily.        . cetirizine (ZYRTEC) 10 MG tablet Take 1 tablet (10 mg total) by mouth daily.  30 tablet  2  . DM-Phenylephrine-Acetaminophen (ALKA-SELTZER PLS SINUS & COUGH) 10-5-325 MG CAPS Take 1 packet by mouth every 6 (six) hours as needed (cold/sinus).       . dorzolamide (TRUSOPT) 2 % ophthalmic solution Place 1 drop into both eyes 2 (two) times daily.        . dorzolamide-timolol (COSOPT) 22.3-6.8 MG/ML ophthalmic solution       . guaiFENesin (MUCINEX) 600 MG 12 hr tablet Take 600 mg by mouth 2 (two) times daily.      Marland Kitchen guaifenesin (ROBITUSSIN) 100 MG/5ML syrup Take 200 mg by mouth 3 (three) times daily as needed for cough.      . latanoprost (XALATAN) 0.005 % ophthalmic solution Place 1 drop into both eyes at bedtime.        . metoprolol  succinate (TOPROL-XL) 50 MG 24 hr tablet TAKE 1 TABLET EVERY DAY  90 tablet  3  . niacin (NIASPAN) 1000 MG CR tablet TAKE 1 TABLET BY MOUTH AT BEDTIME  90 tablet  3  . ramipril (ALTACE) 5 MG capsule TAKE ONE CAPSULE BY MOUTH EVERY DAY  90 capsule  3  . ZETIA 10 MG tablet TAKE 1 TABLET EVERY DAY  90 tablet  3   No current facility-administered medications for this visit.     Past Medical History  Diagnosis Date  . Cerebrovascular disease, unspecified   . Aortic valve disorders     stenosis  . HLD (hyperlipidemia)   . HTN (hypertension)   . CAD (coronary artery disease)     Past Surgical History  Procedure Laterality Date  . Carotid endarterectomy      History   Social History  . Marital Status: Married    Spouse Name: N/A    Number of Children: N/A  . Years of Education: N/A   Occupational History  . Not on file.   Social History Main Topics  . Smoking status: Never Smoker   . Smokeless tobacco: Not on file     Comment: tobacco use - no  . Alcohol Use: No  . Drug Use: Not on file  . Sexual Activity: Not on file   Other Topics Concern  . Not on file   Social History Narrative   Married.  ROS: no fevers or chills, productive cough, hemoptysis, dysphasia, odynophagia, melena, hematochezia, dysuria, hematuria, rash, seizure activity, orthopnea, PND, pedal edema, claudication. Remaining systems are negative.  Physical Exam: Well-developed well-nourished in no acute distress.  Skin is warm and dry.  HEENT is normal.  Neck is supple.  Chest is clear to auscultation with normal expansion.  Cardiovascular exam is regular rate and rhythm. 3/6 systolic murmur left sternal border radiating to the carotids. S2 is diminished. Abdominal exam nontender or distended. No masses palpated. Extremities show no edema. There is some ecchymosis right lower extremity. There is a possible small pulsatile mass in the right groin. No bruit. neuro grossly intact

## 2013-08-17 NOTE — Patient Instructions (Signed)
Your physician recommends that you schedule a follow-up appointment in: [place]  Your physician recommends that you HAVE LAB [place] physician has requested that you have a lower extremity arterial duplex. During this test, ultrasound are used to evaluate arterial blood flow in the legs. Allow one hour for this exam. There are no restrictions or special instructions.  RIGHT GROIN CHECK-TODAY PLEASE  REFERRAL TO DR Roxy Manns TO DISCUSS AORTIC VALVE DISEASE

## 2013-08-17 NOTE — Assessment & Plan Note (Signed)
Continue aspirin. Intolerant to statins. 

## 2013-08-17 NOTE — Assessment & Plan Note (Signed)
Patient was noted to have thrombocytopenia during recent admission. Question chronicity. Question related to pneumonia. Repeat CBC. If decreased he may need hematology evaluation prior to surgery.

## 2013-08-17 NOTE — Assessment & Plan Note (Signed)
Patient now has severe aortic stenosis. He has recovered from his pneumonia. He notes some dyspnea on exertion. I feel he needs aortic valve replacement. We will arrange for him to be seen by cardiothoracic surgery.

## 2013-08-17 NOTE — Assessment & Plan Note (Signed)
Intolerant to statins. 

## 2013-08-17 NOTE — Assessment & Plan Note (Signed)
Blood pressure controlled. Continue present medications. 

## 2013-08-17 NOTE — Addendum Note (Signed)
Addended by: Cristopher Estimable on: 08/17/2013 02:56 PM   Modules accepted: Orders

## 2013-08-17 NOTE — Assessment & Plan Note (Addendum)
Question pseudoaneurysm from recent catheterization. Plan ultrasound.

## 2013-08-17 NOTE — Assessment & Plan Note (Signed)
Continue aspirin. Intolerant to statins. He will need carotid Dopplers prior to his aortic valve replacement.

## 2013-08-18 ENCOUNTER — Telehealth: Payer: Self-pay | Admitting: Cardiology

## 2013-08-18 ENCOUNTER — Other Ambulatory Visit: Payer: Medicare Other

## 2013-08-18 LAB — CBC WITH DIFFERENTIAL/PLATELET
Basophils Absolute: 0 10*3/uL (ref 0.0–0.1)
Basophils Relative: 0.5 % (ref 0.0–3.0)
EOS ABS: 0.2 10*3/uL (ref 0.0–0.7)
Eosinophils Relative: 2.7 % (ref 0.0–5.0)
HEMATOCRIT: 41.2 % (ref 39.0–52.0)
HEMOGLOBIN: 13.4 g/dL (ref 13.0–17.0)
LYMPHS ABS: 1.8 10*3/uL (ref 0.7–4.0)
Lymphocytes Relative: 29.5 % (ref 12.0–46.0)
MCHC: 32.6 g/dL (ref 30.0–36.0)
MCV: 100.4 fl — AB (ref 78.0–100.0)
Monocytes Absolute: 0.5 10*3/uL (ref 0.1–1.0)
Monocytes Relative: 8.1 % (ref 3.0–12.0)
NEUTROS ABS: 3.6 10*3/uL (ref 1.4–7.7)
Neutrophils Relative %: 59.2 % (ref 43.0–77.0)
Platelets: 128 10*3/uL — ABNORMAL LOW (ref 150.0–400.0)
RBC: 4.11 Mil/uL — ABNORMAL LOW (ref 4.22–5.81)
RDW: 15.2 % — AB (ref 11.5–14.6)
WBC: 6.2 10*3/uL (ref 4.5–10.5)

## 2013-08-18 NOTE — Telephone Encounter (Signed)
Patient states that she got a call from [facility] telling them to call or come in.  No results found from yesterday's lab.  S/W Lanny Hurst in lab, who is aware.  [name] said patient can come back today for lab appointment.

## 2013-08-18 NOTE — Telephone Encounter (Signed)
New message  ° ° °Test results  °

## 2013-08-22 ENCOUNTER — Encounter: Payer: Self-pay | Admitting: Thoracic Surgery (Cardiothoracic Vascular Surgery)

## 2013-08-22 ENCOUNTER — Institutional Professional Consult (permissible substitution) (INDEPENDENT_AMBULATORY_CARE_PROVIDER_SITE_OTHER): Payer: Medicare Other | Admitting: Thoracic Surgery (Cardiothoracic Vascular Surgery)

## 2013-08-22 ENCOUNTER — Other Ambulatory Visit: Payer: Self-pay | Admitting: *Deleted

## 2013-08-22 VITALS — BP 144/67 | HR 73 | Resp 16 | Ht 68.0 in | Wt 140.0 lb

## 2013-08-22 DIAGNOSIS — I359 Nonrheumatic aortic valve disorder, unspecified: Secondary | ICD-10-CM

## 2013-08-22 DIAGNOSIS — I350 Nonrheumatic aortic (valve) stenosis: Secondary | ICD-10-CM | POA: Insufficient documentation

## 2013-08-22 NOTE — Patient Instructions (Signed)
Stop taking aspirin

## 2013-08-22 NOTE — H&P (Signed)
[address]       [place] [address]             [phone]     CARDIOTHORACIC SURGERY CONSULTATION REPORT  Referring Provider is [name], Gregory Bors, MD PCP is Gregory Gainer, MD  Chief Complaint  Patient presents with  . 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 The patient has remained fairly active and healthy for all of his life. He has a long-standing history of hypertension in several years ago was found to have a heart murmur on routine physical exam. He was diagnosed with aortic stenosis and has been followed using serial echocardiograms. He was hospitalized in January following a particular severe case of the flu which progressed into pneumonia.  He states that he first developed flulike symptoms at the end of December. He suffered from a lingering productive cough for several weeks. Ultimately he developed somewhat of acute onset of shortness of breath with worsening productive cough and hemoptysis. He was hospitalized and treated with intravenous antibiotics. CT scan of the chest revealed findings consistent with pneumonia and notable for the absence of pulmonary embolus. Followup echocardiogram performed at that time demonstrated severe aortic stenosis with preserved left ventricular function. Left and right heart catheterization was performed and notable for the absence of significant coronary artery disease. Mean transvalvular gradient measured across the aortic valve it catheterization was 56 mm mercury.  The patient improved clinically and was seen in followup recently by Dr. Stanford [name]. Now that his pneumonia has completely resolved the patient has been referred for possible elective aortic valve replacement.  The patient reports that  he had remained physically active for most of his life although he admits that he is slow down a fair amount over the past year or so. He has had considerable problems with his eyesight, and he states that this has been the biggest problem which has begun to affect his physical activity. He initially had cataract surgery and suffered retinal detachment, and he now has blurry vision involving both eyes. He also states that he tires easily. He reports mild symptoms of exertional shortness of breath, probably NYHA functional class II.  However, he states that he really doesn't exert himself much anymore and as such he doesn't really notice his breathing that much. He has never had any chest discomfort or chest pressure with activity or rales. He denies any dizzy spells or syncope. He reports no history of PND, orthopnea, or lower extremity edema. He does admit to some problems with short-term memory disturbance, although this has not become problematic from a practical standpoint.  Past Medical History  Diagnosis Date  . Cerebrovascular disease, unspecified   . Aortic valve disorders     stenosis  . HLD (hyperlipidemia)   . HTN (hypertension)   . CAD (coronary artery disease)     Past Surgical History  Procedure Laterality Date  . Carotid endarterectomy      Family History  Problem Relation Age of Onset  . Colon cancer Father   . Heart failure Mother     CHF     History   Social History  . Marital Status: Married    Spouse Name: N/A    Number of Children: N/A  . Years of  Education: N/A   Occupational History  . Not on file.   Social History Main Topics  . Smoking status: Never Smoker   . Smokeless tobacco: Not on file     Comment: tobacco use - no  . Alcohol Use: No  . Drug Use: Not on file  . Sexual Activity: Not on file   Other Topics Concern  . Not on file   Social History Narrative   Married.     Current Outpatient Prescriptions  Medication Sig Dispense Refill  .  aspirin 325 MG EC tablet Take 325 mg by mouth daily.        . cetirizine (ZYRTEC) 10 MG tablet Take 1 tablet (10 mg total) by mouth daily.  30 tablet  2  . DM-Phenylephrine-Acetaminophen (ALKA-SELTZER PLS SINUS & COUGH) 10-5-325 MG CAPS Take 1 packet by mouth every 6 (six) hours as needed (cold/sinus).       . dorzolamide (TRUSOPT) 2 % ophthalmic solution Place 1 drop into both eyes 2 (two) times daily.        . dorzolamide-timolol (COSOPT) 22.3-6.8 MG/ML ophthalmic solution       . guaiFENesin (MUCINEX) 600 MG 12 hr tablet Take 600 mg by mouth 2 (two) times daily.      Marland Kitchen guaifenesin (ROBITUSSIN) 100 MG/5ML syrup Take 200 mg by mouth 3 (three) times daily as needed for cough.      . latanoprost (XALATAN) 0.005 % ophthalmic solution Place 1 drop into both eyes at bedtime.        . metoprolol succinate (TOPROL-XL) 50 MG 24 hr tablet TAKE 1 TABLET EVERY DAY  90 tablet  3  . niacin (NIASPAN) 1000 MG CR tablet TAKE 1 TABLET BY MOUTH AT BEDTIME  90 tablet  3  . ramipril (ALTACE) 5 MG capsule TAKE ONE CAPSULE BY MOUTH EVERY DAY  90 capsule  3  . ZETIA 10 MG tablet TAKE 1 TABLET EVERY DAY  90 tablet  3   No current facility-administered medications for this visit.    Allergies  Allergen Reactions  . Levaquin [Levofloxacin]     RASH      Review of Systems:   General:  decreased appetite, decreased energy, no weight gain, + weight loss, no fever  Cardiac:  no chest pain with exertion, no chest pain at rest, + SOB with exertion, no resting SOB, no PND, no orthopnea, no palpitations, no arrhythmia, no atrial fibrillation, no LE edema, no dizzy spells, no syncope  Respiratory:  no shortness of breath, no home oxygen, no current productive cough, no dry cough, no bronchitis, no wheezing, no recent hemoptysis, no asthma, no pain with inspiration or cough, no sleep apnea, no CPAP at night  GI:   no difficulty swallowing, no reflux, no frequent heartburn, no hiatal hernia, no abdominal pain, no  constipation, no diarrhea, no hematochezia, no hematemesis, no melena  GU:   no dysuria,  no frequency, no urinary tract infection, no hematuria, no enlarged prostate, no kidney stones, no kidney disease  Vascular:  no pain suggestive of claudication, no pain in feet, no leg cramps, no varicose veins, no DVT, no non-healing foot ulcer  Neuro:   no stroke, no TIA's, no seizures, no headaches, no temporary blindness one eye,  no slurred speech, no peripheral neuropathy, no chronic pain, no instability of gait, + significant memory/cognitive dysfunction  Musculoskeletal: no arthritis, no joint swelling, no myalgias, no difficulty walking, normal mobility   Skin:   no rash, no itching,  no skin infections, no pressure sores or ulcerations  Psych:   no anxiety, no depression, no nervousness, no unusual recent stress  Eyes:   + blurry vision, no floaters, + recent vision changes, + wears glasses or contacts  ENT:   + hearing loss, no loose or painful teeth, no dentures, last saw dentist 2012  Hematologic:  + easy bruising, no abnormal bleeding, no clotting disorder, no frequent epistaxis  Endocrine:  no diabetes, does not check CBG's at home     Physical Exam:   BP 144/67  Pulse 73  Resp 16  Ht 5\' 8"  (1.727 m)  Wt 140 lb (63.504 kg)  BMI 21.29 kg/m2  SpO2 94%  General:  Thin but o/w well-appearing  HEENT:  Unremarkable   Neck:   no JVD, no bruits, no adenopathy   Chest:   clear to auscultation, symmetrical breath sounds, no wheezes, no rhonchi   CV:   RRR, grade III/VI crescendo/decrescendo systolic murmur   Abdomen:  soft, non-tender, no masses   Extremities:  warm, well-perfused, pulses not palpable, no LE edema, + varicose veins R>L  Rectal/GU  Deferred  Neuro:   Grossly non-focal and symmetrical throughout  Skin:   Clean and dry, no rashes, no breakdown   Diagnostic Tests:  Transthoracic Echocardiography  Patient:    [name], [name] MR #:       [id number] Study Date:  07/24/2013 Gender:     M Age:        36 Height:     172.7cm Weight:     65.5kg BSA:        1.22m^2 Pt. Status: Room:       3W11C    Rhodia Albright  SONOGRAPHER  Donata Clay  PERFORMING   Chmg, Inpatient cc:  ------------------------------------------------------------ LV EF: 60% -   65%  ------------------------------------------------------------ Indications:      MI - acute 410.91.  ------------------------------------------------------------ History:   PMH:   Dyspnea.  Aortic valve disease.  Mitral valve disease.  Risk factors:  Hypertension. Dyslipidemia.   ------------------------------------------------------------ Study Conclusions  - Left ventricle: Poor acoustic windows limit study [place]   LVEF appears moderately depressed with   hypokinesis/akinesis of the inferior, posterior and   inferolatel The cavity size was normal. Systolic function   was normal. The estimated ejection fraction was in the   range of 60% to 65%. Doppler parameters are consistent   with abnormal left ventricular relaxation (grade 1   diastolic dysfunction). - Aortic valve: AV is not well visualized. Appears   thickened. Peak and mean gradients through the valve are   82 and 52 mm Hg respectively consistent with   severe/critical AS. Transthoracic echocardiography.  M-mode, complete 2D, spectral Doppler, and color Doppler.  Height:  Height: 172.7cm. Height: 68in.  Weight:  Weight: 65.5kg. Weight: 144lb.  Body mass index:  BMI: 21.9kg/m^2.  Body surface area:    BSA: 1.36m^2.  Blood pressure:     168/58.  Patient status:  Inpatient.  Location:  Bedside.  ------------------------------------------------------------  ------------------------------------------------------------ Left ventricle:  Poor acoustic windows limit study [place] LVEF appears moderately depressed with hypokinesis/akinesis of the inferior, posterior and inferolatel The cavity size was normal.  Systolic function was normal. The estimated ejection fraction was in the range of 60% to 65%. Doppler parameters are consistent with abnormal left ventricular relaxation (grade 1 diastolic dysfunction).  ------------------------------------------------------------ Aortic valve:  AV is not well visualized. Appears thickened. Peak and mean gradients through the  valve are 82 and 52 mm Hg respectively consistent with severe/critical AS. Doppler:   No significant regurgitation.    VTI ratio of LVOT to aortic valve: 0.22. Valve area: 0.69cm^2(VTI). Indexed valve area: 0.39cm^2/m^2 (VTI). Peak velocity ratio of LVOT to aortic valve: 0.2. Valve area: 0.63cm^2 (Vmax). Indexed valve area: 0.36cm^2/m^2 (Vmax).    Mean gradient: 35mm Hg (S). Peak gradient: 67mm Hg (S).  ------------------------------------------------------------ Mitral valve:   Calcified annulus. Mildly thickened leaflets .  Doppler:   No significant regurgitation.    Valve area by continuity equation (using LVOT flow): 1.85cm^2. Indexed valve area by continuity equation (using LVOT flow): 1.04cm^2/m^2.    Mean gradient: 50mm Hg (D). Peak gradient: 49mm Hg (D).  ------------------------------------------------------------ Left atrium:  LA appears mildly dilated.  ------------------------------------------------------------ Right ventricle:  The cavity size was normal. Wall thickness was normal. Systolic function was normal.  ------------------------------------------------------------ Tricuspid valve:   Structurally normal valve.   Leaflet separation was normal.  Doppler:  Transvalvular velocity was within the normal range.  Trivial regurgitation.  ------------------------------------------------------------ Right atrium:  The atrium was normal in size.  ------------------------------------------------------------ Pericardium:  Small pericardial effusion around  apex  ------------------------------------------------------------  2D measurements        Normal  Doppler measurements   Normal Left ventricle                 LVOT LVID ED,   41.8 mm     43-52   Peak vel,  88.6 cm/s   ------ chord,                         S PLAX                           VTI, S     22.6 cm     ------ LVID ES,   35.7 mm     23-38   Aortic valve chord,                         Peak vel,   440 cm/s   ------ PLAX                           S FS,          15 %      >29     Mean vel,   329 cm/s   ------ chord,                         S PLAX                           VTI, S      103 cm     ------ LVPW, ED   10.2 mm     ------  Mean         50 mm Hg  ------ IVS/LVPW   1.05        <1.3    gradient, ratio, ED                      S Ventricular septum             Peak         77 mm Hg  ------ IVS, ED    10.7 mm     ------  gradient, LVOT                           S Diam, S      20 mm     ------  VTI ratio  0.22        ------ Area       3.14 cm^2   ------  LVOT/AV Aorta                          Area, VTI  0.69 cm^2   ------ Root         32 mm     ------  Area index 0.39 cm^2/m ------ diam, ED                       (VTI)           ^2 Left atrium                    Peak vel    0.2        ------ AP dim    40.88 mm     ------  ratio, AP dim     2.31 cm/m^2 <2.2    LVOT/AV index                          Area, Vmax 0.63 cm^2   ------                                Area index 0.36 cm^2/m ------                                (Vmax)          ^2                                Mitral valve                                Peak E vel  104 cm/s   ------                                Peak A vel  147 cm/s   ------                                Mean vel,   104 cm/s   ------                                D                                Decelerati  229 ms     150-23                                on time  0                                Mean          5 mm Hg  ------                                 gradient,                                D                                Peak         11 mm Hg  ------                                gradient,                                D                                Peak E/A    0.7        ------                                ratio                                Area       1.85 cm^2   ------                                (LVOT)                                continuity                                Area index 1.04 cm^2/m ------                                (LVOT           ^2                                cont)                                Annulus    38.3 cm     ------                                VTI  Systemic veins                                Estimated     8 mm Hg  ------                                CVP                                Right ventricle                                Aa vel,    18.4 cm/s   ------                                lat ann,                                tiss DP   ------------------------------------------------------------ Prepared and Electronically Authenticated by  Dorris Carnes 2015-01-26T12:26:40.080     Left and Right Heart Catheterization with Coronary Angiography  Report  [name]  80 y.o.   male 1930-02-15  Procedure Date: 07/26/2013  Referring Physician: C. Angelena Form, MD Primary Cardiologist:: Kirk Ruths, M.D. Primary Care Physician: [name], M.D.  INDICATIONS: Non-ST elevation myocardial infarction and aortic stenosis  PROCEDURE: 1. Left heart catheterization; 2. Right heart catheterization; 3. Coronary angiography; 4. Left ventriculography  CONSENT:   The risks, benefits, and details of the procedure were explained in detail to the patient. Risks including death, stroke, heart attack, kidney injury, allergy, limb ischemia, bleeding and radiation injury were discussed.  The patient verbalized understanding and  wanted to proceed.  Informed written consent was obtained.  PROCEDURE TECHNIQUE:  After Xylocaine anesthesia a 5 French sheath was placed in the right femoral artery with a single anterior needle wall stick. A 7 French sheath was placed in the right femoral vein also with a single anterior wall stick. Right heart catheterization was performed with a 6 French Swan-Ganz catheter. Coronary angiography was done using a left and right 4 cm Judkins catheters.  Left ventriculography was done using the 5 [place] JR 4 diagnostic catheter using hand injection.   Hemostasis was achieved with manual compression.  MEDICATIONS: Versed 1 mg IV and fentanyl 50 mcg IV  CONTRAST:  Total of 100 cc.  COMPLICATIONS:  none   HEMODYNAMICS:  Aortic pressure 114/45 mmHg; LV pressure 170/13 mmHg; LVEDP 10 mmHg, RA 4 mmHg mean, RV 37/5 mmHg, PA 34/14 mmHg, PCWP(mean) 6 mmHg , Cardiac Output 3.13 L per minute by thermodilution and 4.56 L per minute using Fick; AV gradient peak to peak 65 mmHg with a mean gradient of 56 mm mercury; aortic valve area using thermodilution cardiac output is 0.42 cm square and 0.61 cm square using the Fick cardiac output  ANGIOGRAPHIC DATA:   The left main coronary artery is short and free of significant obstruction. Heavy calcification is noted. No catheter damping is noted with engagement..  The left anterior descending artery is proximal mid and distal eccentric 50% stenoses. No high-grade obstruction is noted. A large diagonal vessel is also free of obstruction..  The  left circumflex artery is large and widely patent.  The right coronary artery is dominant and normal..   LEFT VENTRICULOGRAM:  Left ventricular angiogram was done in the 30 RAO projection and revealed normal cavity size with normal function and estimated ejection fraction of 60%  CINEFLUOROSCOPY AORTIC VALVE: heavy leaflet calcification, mitral annular calcification, and mild aortic root calcification.  IMPRESSIONS:  1.  Critical aortic stenosis, calcific. 2. Mild to moderate left anterior descending coronary artery disease. No significant obstruction is present. The left main is calcified but has no significant obstruction. The right coronary and circumflex are widely patent and essentially normal for age 45. Normal left ventricular systolic function   RECOMMENDATION:  Consideration of aortic valve surgery or TVR depending upon clinical features and heart valve team opinion.          STS Risk Calculator  Procedure    AVR  Risk of Mortality   2.0% Morbidity or Mortality  14.6% Prolonged LOS   4.5% Short LOS    36.4% Permanent Stroke   2.0% Prolonged Vent Support  7.5% DSW Infection    0.2% Renal Failure    3.8% Reoperation    8.2%    Impression:  Patient has severe symptomatic aortic stenosis with normal left ventricular systolic function. Risks associated with conventional surgical aortic valve replacement should be reasonably low.  I agree that he would likely best be treated with conventional surgical aortic valve replacement.  However, the patient does report significant problems with short-term memory difficulty as well as significant functional decline over the past year which he attributes primarily to worsening eyesight.     Plan:  The patient and his family were counseled at length regarding treatment alternatives for management of severe symptomatic aortic stenosis. Alternative approaches such as conventional aortic valve replacement, transcatheter aortic valve replacement, and long term medical therapy were compared and contrasted at length.  The risks associated with conventional surgical aortic valve replacement were been discussed in detail, as were expectations for post-operative convalescence. Long-term prognosis with medical therapy was discussed. This discussion was placed in the context of the patient's own specific clinical presentation and past medical history.   Discussion was  held comparing the relative risks of mechanical valve replacement with need for lifelong anticoagulation versus use of a bioprosthetic tissue valve and the associated potential for late structural valve deterioration in failure.  This discussion was placed in the context of the patient's particular circumstances, and as a result the patient specifically requests that their valve be replaced using a bioprosthetic tissue valve.  They understand and accept all potential risks of surgery including but not limited to risk of death, stroke or other neurologic complication, myocardial infarction, congestive heart failure, respiratory failure, renal failure, bleeding requiring transfusion and/or reexploration, arrhythmia, infection or other wound complications, pneumonia, pleural and/or pericardial effusion, pulmonary embolus, aortic dissection or other major vascular complication, or delayed complications related to valve repair or replacement including but not limited to structural valve deterioration and failure, thrombosis, embolization, endocarditis, or paravalvular leak.  All of their questions have been answered.  We plan to proceed with surgery on Wednesday, 09/13/2013. The patient will return for followup on Monday, 09/11/2013 prior to surgery.   I spent in excess of 80 minutes during the conduct of this office consultation and >50% of this time involved direct face-to-face encounter with the patient for counseling and/or coordination of their care.   Valentina Gu. Roxy Manns, MD 08/22/2013 3:28 PM

## 2013-08-23 NOTE — Telephone Encounter (Signed)
Spoke with pt wife, aware the lab work from the 19th is fine. They do not need any repeat blood work for dr Stanford Breed.

## 2013-08-23 NOTE — Telephone Encounter (Signed)
Follow up   Pt's wife returning your call, please give them a call back.

## 2013-09-04 ENCOUNTER — Encounter (HOSPITAL_COMMUNITY): Payer: Self-pay | Admitting: Pharmacy Technician

## 2013-09-09 NOTE — Pre-Procedure Instructions (Signed)
[facility] - Preparing for Surgery  Before surgery, you can play an important role.  Because skin is not sterile, your skin needs to be as free of germs as possible.  You can reduce the number of germs on you skin by washing with CHG (chlorahexidine gluconate) soap before surgery.  CHG is an antiseptic cleaner which kills germs and bonds with the skin to continue killing germs even after washing.  Please DO NOT use if you have an allergy to CHG or antibacterial soaps.  If your skin becomes reddened/irritated stop using the CHG and inform your nurse when you arrive at Short Stay.  Do not shave (including legs and underarms) for at least 48 hours prior to the first CHG shower.  You may shave your face.  Please follow these instructions carefully:   1.  Shower with CHG Soap the night before surgery and the morning of Surgery.  2.  If you choose to wash your hair, wash your hair first as usual with your normal shampoo.  3.  After you shampoo, rinse your hair and body thoroughly to remove the shampoo.  4.  Use CHG as you would any other liquid soap.  You can apply CHG directly to the skin and wash gently with scrungie or a clean washcloth.  5.  Apply the CHG Soap to your body ONLY FROM THE NECK DOWN.  Do not use on open wounds or open sores.  Avoid contact with your eyes, ears, mouth and genitals (private parts).  Wash genitals (private parts) with your normal soap.  6.  Wash thoroughly, paying special attention to the area where your surgery will be performed.  7.  Thoroughly rinse your body with warm water from the neck down.  8.  DO NOT shower/wash with your normal soap after using and rinsing off the CHG Soap.  9.  Pat yourself dry with a clean towel.            10.  Wear clean pajamas.            11.  Place clean sheets on your bed the night of your first shower and do not sleep with pets.  Day of Surgery  Do not apply any lotions the morning of surgery.  Please wear clean clothes to the  hospital/surgery center.   

## 2013-09-09 NOTE — Pre-Procedure Instructions (Signed)
[name]  09/09/2013   Your procedure is scheduled on:  March 18  Report to [facility] Entrance "A" [place] at [employer] AM.  Call this number if you have problems the morning of surgery: [phone]   Remember:   Do not eat food or drink liquids after midnight.   Take these medicines the morning of surgery with A SIP OF WATER: Eye drops, Metoprolol,   STOP/ Do not take Aspirin, Aleve, Naproxen, Advil, Ibuprofen, Vitamin, Herbs, or Supplements starting today   Do not wear jewelry.  Do not wear lotions, powders, or perfumes. You may not wear deodorant.  Do not shave 48 hours prior to surgery. Men may shave face and neck.  Do not bring valuables to the hospital.  [facility] is not responsible for any belongings or valuables.               Contacts, dentures or bridgework may not be worn into surgery.  Leave suitcase in the car. After surgery it may be brought to your room.  For patients admitted to the hospital, discharge time is determined by your treatment team.               Special Instructions: Incentive Spirometry - Practice and bring it with you on the day of surgery. See [facility] Preparing For Surgery   Please read over the following fact sheets that you were given: Pain Booklet, Coughing and Deep Breathing, Blood Transfusion Information, Open Heart Packet and Surgical Site Infection Prevention

## 2013-09-11 ENCOUNTER — Encounter: Payer: Self-pay | Admitting: Thoracic Surgery (Cardiothoracic Vascular Surgery)

## 2013-09-11 ENCOUNTER — Encounter (HOSPITAL_COMMUNITY)
Admission: RE | Admit: 2013-09-11 | Discharge: 2013-09-11 | Disposition: A | Discharge status: Home / Self Care | Payer: Medicare Other | Source: Ambulatory Visit | Attending: Thoracic Surgery (Cardiothoracic Vascular Surgery) | Admitting: Thoracic Surgery (Cardiothoracic Vascular Surgery)

## 2013-09-11 ENCOUNTER — Ambulatory Visit (INDEPENDENT_AMBULATORY_CARE_PROVIDER_SITE_OTHER): Payer: Medicare Other | Admitting: Thoracic Surgery (Cardiothoracic Vascular Surgery)

## 2013-09-11 ENCOUNTER — Ambulatory Visit (HOSPITAL_COMMUNITY)
Admission: RE | Admit: 2013-09-11 | Discharge: 2013-09-11 | Disposition: A | Discharge status: Home / Self Care | Payer: Medicare Other | Source: Ambulatory Visit | Attending: Thoracic Surgery (Cardiothoracic Vascular Surgery) | Admitting: Thoracic Surgery (Cardiothoracic Vascular Surgery)

## 2013-09-11 ENCOUNTER — Encounter (HOSPITAL_COMMUNITY): Payer: Self-pay

## 2013-09-11 VITALS — BP 95/45 | HR 70 | Resp 16 | Ht 68.0 in | Wt 140.0 lb

## 2013-09-11 VITALS — BP 131/63 | HR 69 | Temp 98.3°F | Resp 18 | Ht 68.0 in | Wt 140.0 lb

## 2013-09-11 DIAGNOSIS — I359 Nonrheumatic aortic valve disorder, unspecified: Secondary | ICD-10-CM

## 2013-09-11 DIAGNOSIS — I2510 Atherosclerotic heart disease of native coronary artery without angina pectoris: Secondary | ICD-10-CM

## 2013-09-11 DIAGNOSIS — Z01812 Encounter for preprocedural laboratory examination: Secondary | ICD-10-CM | POA: Insufficient documentation

## 2013-09-11 DIAGNOSIS — Z01810 Encounter for preprocedural cardiovascular examination: Secondary | ICD-10-CM

## 2013-09-11 DIAGNOSIS — Z01818 Encounter for other preprocedural examination: Secondary | ICD-10-CM | POA: Insufficient documentation

## 2013-09-11 DIAGNOSIS — I350 Nonrheumatic aortic (valve) stenosis: Secondary | ICD-10-CM

## 2013-09-11 HISTORY — DX: Unspecified hearing loss, unspecified ear: H91.90

## 2013-09-11 HISTORY — DX: Pneumonia, unspecified organism: J18.9

## 2013-09-11 HISTORY — DX: Unspecified malignant neoplasm of skin of unspecified part of face: C44.300

## 2013-09-11 HISTORY — DX: Unspecified glaucoma: H40.9

## 2013-09-11 LAB — BLOOD GAS, ARTERIAL
ACID-BASE DEFICIT: 0.5 mmol/L (ref 0.0–2.0)
BICARBONATE: 23.5 meq/L (ref 20.0–24.0)
Drawn by: 344381
O2 SAT: 98 %
Patient temperature: 98.6
TCO2: 24.7 mmol/L (ref 0–100)
pCO2 arterial: 38.1 mmHg (ref 35.0–45.0)
pH, Arterial: 7.408 (ref 7.350–7.450)
pO2, Arterial: 92.4 mmHg (ref 80.0–100.0)

## 2013-09-11 LAB — PULMONARY FUNCTION TEST
DL/VA % pred: 102 %
DL/VA: 4.4 ml/min/mmHg/L
DLCO COR: 18.27 ml/min/mmHg
DLCO cor % pred: 67 %
DLCO unc % pred: 67 %
DLCO unc: 18.27 ml/min/mmHg
FEF 25-75 Post: 1.02 L/sec
FEF 25-75 Pre: 0.74 L/sec
FEF2575-%CHANGE-POST: 36 %
FEF2575-%Pred-Post: 67 %
FEF2575-%Pred-Pre: 49 %
FEV1-%CHANGE-POST: 8 %
FEV1-%PRED-PRE: 66 %
FEV1-%Pred-Post: 72 %
FEV1-POST: 1.66 L
FEV1-PRE: 1.53 L
FEV1FVC-%CHANGE-POST: 3 %
FEV1FVC-%PRED-PRE: 86 %
FEV6-%CHANGE-POST: 6 %
FEV6-%Pred-Post: 84 %
FEV6-%Pred-Pre: 79 %
FEV6-PRE: 2.4 L
FEV6-Post: 2.56 L
FEV6FVC-%Change-Post: 1 %
FEV6FVC-%Pred-Post: 106 %
FEV6FVC-%Pred-Pre: 104 %
FVC-%Change-Post: 4 %
FVC-%Pred-Post: 79 %
FVC-%Pred-Pre: 75 %
FVC-Post: 2.6 L
FVC-Pre: 2.49 L
POST FEV6/FVC RATIO: 98 %
Post FEV1/FVC ratio: 64 %
Pre FEV1/FVC ratio: 61 %
Pre FEV6/FVC Ratio: 96 %
RV % pred: 126 %
RV: 3.13 L
TLC % PRED: 88 %
TLC: 5.57 L

## 2013-09-11 LAB — CBC
HEMATOCRIT: 40.9 % (ref 39.0–52.0)
HEMOGLOBIN: 14.4 g/dL (ref 13.0–17.0)
MCH: 33.9 pg (ref 26.0–34.0)
MCHC: 35.2 g/dL (ref 30.0–36.0)
MCV: 96.2 fL (ref 78.0–100.0)
Platelets: 120 10*3/uL — ABNORMAL LOW (ref 150–400)
RBC: 4.25 MIL/uL (ref 4.22–5.81)
RDW: 13.6 % (ref 11.5–15.5)
WBC: 5.3 10*3/uL (ref 4.0–10.5)

## 2013-09-11 LAB — URINALYSIS, ROUTINE W REFLEX MICROSCOPIC
BILIRUBIN URINE: NEGATIVE
GLUCOSE, UA: NEGATIVE mg/dL
HGB URINE DIPSTICK: NEGATIVE
KETONES UR: NEGATIVE mg/dL
Leukocytes, UA: NEGATIVE
Nitrite: NEGATIVE
PH: 7 (ref 5.0–8.0)
Protein, ur: NEGATIVE mg/dL
Specific Gravity, Urine: 1.021 (ref 1.005–1.030)
Urobilinogen, UA: 0.2 mg/dL (ref 0.0–1.0)

## 2013-09-11 LAB — COMPREHENSIVE METABOLIC PANEL
ALT: 14 U/L (ref 0–53)
AST: 24 U/L (ref 0–37)
Albumin: 4 g/dL (ref 3.5–5.2)
Alkaline Phosphatase: 38 U/L — ABNORMAL LOW (ref 39–117)
BILIRUBIN TOTAL: 0.6 mg/dL (ref 0.3–1.2)
BUN: 23 mg/dL (ref 6–23)
CO2: 20 mEq/L (ref 19–32)
CREATININE: 0.8 mg/dL (ref 0.50–1.35)
Calcium: 9.4 mg/dL (ref 8.4–10.5)
Chloride: 104 mEq/L (ref 96–112)
GFR calc Af Amer: >90 mL/min (ref >90)
GFR calc non Af Amer: 82 mL/min — ABNORMAL LOW (ref >90)
GLUCOSE: 107 mg/dL — AB (ref 70–99)
POTASSIUM: 4.4 meq/L (ref 3.7–5.3)
Sodium: 140 mEq/L (ref 137–147)
TOTAL PROTEIN: 6.6 g/dL (ref 6.0–8.3)

## 2013-09-11 LAB — SURGICAL PCR SCREEN
MRSA, PCR: NEGATIVE
STAPHYLOCOCCUS AUREUS: NEGATIVE

## 2013-09-11 LAB — HEMOGLOBIN A1C
HEMOGLOBIN A1C: 5 % (ref <5.7)
MEAN PLASMA GLUCOSE: 97 mg/dL (ref <117)

## 2013-09-11 LAB — ABO/RH: ABO/RH(D): A NEG

## 2013-09-11 LAB — PROTIME-INR
INR: 1.04 (ref 0.00–1.49)
Prothrombin Time: 13.4 seconds (ref 11.6–15.2)

## 2013-09-11 LAB — APTT: aPTT: 33 seconds (ref 24–37)

## 2013-09-11 MED ORDER — ALBUTEROL SULFATE (2.5 MG/3ML) 0.083% IN NEBU
2.5000 mg | INHALATION_SOLUTION | Freq: Once | RESPIRATORY_TRACT | Status: AC
  Administered 2013-09-11: 2.5 mg via RESPIRATORY_TRACT

## 2013-09-11 NOTE — Patient Instructions (Signed)
On the morning of surgery take only your metoprolol with a sip of water

## 2013-09-11 NOTE — Progress Notes (Signed)
PCP is Dr. Redge Gainer and Cardiologist is Dr. Kirk Ruths. Patient denied having any acute cardiac or pulmonary issues. Wife at chairside during PAT visit.

## 2013-09-11 NOTE — Progress Notes (Signed)
[address]       [place] [address]             [phone]     CARDIOTHORACIC SURGERY OFFICE NOTE  Referring Provider is Stanford Breed, Denice Bors, MD PCP is Redge Gainer, MD   HPI:  Patient returns for followup of severe symptomatic aortic stenosis. He was originally seen in consultation on 08/22/2013 at which time we made tentatively plans to proceed with surgery later this week.  Patient returns to the office today for followup prior to surgery scheduled for 09/13/2013. Over the last 2 weeks he reports no new problems or complaints. He specifically denies any fevers chills or productive cough.  He describes mild symptoms of exertional shortness of breath which remain unchanged. He is eager to proceed with surgery as previously planned.   Current Outpatient Prescriptions  Medication Sig Dispense Refill  . dorzolamide-timolol (COSOPT) 22.3-6.8 MG/ML ophthalmic solution Place 1 drop into both eyes 2 (two) times daily.       Marland Kitchen ezetimibe (ZETIA) 10 MG tablet Take 10 mg by mouth daily.      Marland Kitchen guaiFENesin (MUCINEX) 600 MG 12 hr tablet Take 600 mg by mouth 2 (two) times daily as needed for cough or to loosen phlegm.       . metoprolol succinate (TOPROL-XL) 50 MG 24 hr tablet Take 50 mg by mouth daily. Take with or immediately following a meal.      . niacin (NIASPAN) 1000 MG CR tablet Take 1,000 mg by mouth at bedtime.      . ramipril (ALTACE) 5 MG capsule Take 5 mg by mouth daily.      . Travoprost, BAK Free, (TRAVATAN) 0.004 % SOLN ophthalmic solution Place 1 drop into both eyes at bedtime.      Marland Kitchen aspirin 325 MG EC tablet Take 325 mg by mouth daily.         No current facility-administered medications for this visit.      Physical Exam:   BP 95/45  Pulse 70  Resp 16  Ht 5\' 8"  (1.727 m)  Wt 140 lb (63.504 kg)  BMI 21.29 kg/m2  SpO2 98%  General:  Well-appearing  Chest:   Clear to auscultation  CV:   Regular rate and rhythm with prominent systolic  murmur  Incisions:  n/a  Abdomen:  Soft and nontender  Extremities:  Warm and well-perfused  Diagnostic Tests:  CHEST 2 VIEW  COMPARISON: CT 07/23/2013  FINDINGS:  Bowel within the left lower hemi thorax again noted. When compared  to prior CT, this was shown to be related to a large diaphragmatic  hernia. This is unchanged. Right lung is clear. Mild hyperinflation  of the right lung. Heart is normal size. No effusions or acute bony  abnormality.  IMPRESSION:  Large left diaphragmatic hernia.  Suspect mild COPD.  No acute findings.  Electronically Signed  By: Rolm Baptise M.D.  On: 09/11/2013 15:38    Impression:  Patient has severe symptomatic aortic stenosis with normal left ventricular systolic function. Risks associated with conventional surgical aortic valve replacement should be reasonably low. I agree that he would likely best be treated with conventional surgical aortic valve replacement. However, the patient does report significant problems with short-term memory difficulty as well as significant functional decline over the past year which he attributes primarily to worsening eyesight.  I suspect that he may have some mild dementia, and he may be at somewhat increased risk for  postoperative confusion or delirium.    Plan:  I have again reviewed the indications, risks, and potential benefits of surgery with the patient and his family in the office today. We plan to proceed with surgery on Wednesday, 09/13/2013 as previously planned. All the questions been addressed.  I spent in excess of 15 minutes during the conduct of this office consultation and >50% of this time involved direct face-to-face encounter with the patient for counseling and/or coordination of their care.   Valentina Gu. Roxy Manns, MD 09/11/2013 4:44 PM

## 2013-09-11 NOTE — Pre-Procedure Instructions (Signed)
[name]  09/11/2013   Your procedure is scheduled on:  Wednesday September 13, 2013 at 8:30 AM  Report to [facility] Entrance "A" Admitting at 6:30 AM.  Call this number if you have problems the morning of surgery: [phone]   Remember:   Do not eat food or drink liquids after midnight.   Take these medicines the morning of surgery with A SIP OF WATER: Eye drops, Metoprolol (Toprol XL)   STOP/ Do not take Aspirin, Aleve, Naproxen, Advil, Ibuprofen, Vitamin, Herbs, or Supplements starting today   Do not wear jewelry.  Do not wear lotions, powders, or colognes. You may not wear deodorant.  Men may shave face and neck.  Do not bring valuables to the hospital.  [facility] is not responsible for any belongings or valuables.               Contacts, dentures or bridgework may not be worn into surgery.  Leave suitcase in the car. After surgery it may be brought to your room.  For patients admitted to the hospital, discharge time is determined by your treatment team.               Special Instructions: Shower the night before and the morning of your surgery using CHG soap   Please read over the following fact sheets that you were given: Pain Booklet, Coughing and Deep Breathing, Blood Transfusion Information, Open Heart Packet and Surgical Site Infection Prevention

## 2013-09-11 NOTE — Progress Notes (Signed)
VASCULAR LAB PRELIMINARY  PRELIMINARY  PRELIMINARY  PRELIMINARY  Pre-op Cardiac Surgery  Carotid Findings:  No significant ICA stenosis bilaterally.  Vertebral artery flow antegrade.  Upper Extremity Right Left  Brachial Pressures 120 114  Radial Waveforms triphasic triphasic  Ulnar Waveforms triphasic triphasic  Palmar Arch (Allen's Test) Obliterates with ulnar compression Obliterates with ulnar compression   Findings:      Lower  Extremity Right Left  Dorsalis Pedis triphasic triphasic  Anterior Tibial    Posterior Tibial triphasic triphasic  Ankle/Brachial Indices      Findings:     Erica Richwine, RVT 09/11/2013, 3:09 PM

## 2013-09-11 NOTE — Progress Notes (Signed)
Anesthesia Chart Review:  Patient is a 80 year old male scheduled for AVR on 09/13/13 by Dr. Roxy Manns.  History includes non-smoker, severe AS, non-obstructive CAD (50% LAD) 07/26/13, CVA, HLD, HTN, PNA 06/2013, hard of hearing, skin cancer, glaucoma, carotid endererectomy. PCP is Dr. Redge Gainer.  Cardiologist is Dr. Kirk Ruths.  Cardiac catheterization in 07/26/13 showed no obstructive disease in the left main. There was a 50% proximal, mid and distal LAD. No disease noted in the circumflex or right coronary artery. Ejection fraction 60%.  Echo on 07/24/13 showed: - Left ventricle: Poor acoustic windows limit study [place] LVEF appears moderately depressed with hypokinesis/akinesis of the inferior, posterior and inferolatel The cavity size was normal. Systolic function was normal. The estimated ejection fraction was in the range of 60% to 65%. Doppler parameters are consistent with abnormal left ventricular relaxation (grade 1 diastolic dysfunction). - Aortic valve: AV is not well visualized. Appears thickened. Peak and mean gradients through the valve are 82 and 52 mm Hg respectively consistent with severe/critical AS.  EKG on 09/11/13 showed NSR, occasional PVC, left BBB. Left BBB is not new.  No significant ICA stenosis by duplex on 09/11/13 (preliminary report).  CXR on 09/11/13 showed: Large left diaphragmatic hernia. Suspect mild COPD. No acute findings.  Preoperative labs and PFTs noted.  A1C is still pending.  Anticipate that he can proceed as planned.  [facility] Short Stay Center/Anesthesiology Phone [phone] 09/11/2013 6:10 PM

## 2013-09-12 MED ORDER — METOPROLOL TARTRATE 12.5 MG HALF TABLET: 12.5000 mg | ORAL_TABLET | Freq: Once | ORAL | Status: DC

## 2013-09-12 MED ORDER — EPINEPHRINE HCL 1 MG/ML IJ SOLN
0.5000 ug/min | INTRAVENOUS | Status: DC
  Filled 2013-09-12: qty 4

## 2013-09-12 MED ORDER — DEXTROSE 5 % IV SOLN
1.5000 g | INTRAVENOUS | Status: AC
  Administered 2013-09-13: .75 g via INTRAVENOUS
  Administered 2013-09-13: 1.5 g via INTRAVENOUS
  Filled 2013-09-12 (×2): qty 1.5

## 2013-09-12 MED ORDER — SODIUM CHLORIDE 0.9 % IV SOLN
INTRAVENOUS | Status: AC
  Administered 2013-09-13: 1.5 [IU]/h via INTRAVENOUS
  Filled 2013-09-12: qty 1

## 2013-09-12 MED ORDER — POTASSIUM CHLORIDE 2 MEQ/ML IV SOLN
80.0000 meq | INTRAVENOUS | Status: DC
Start: 2013-09-13 — End: 2013-09-13
  Filled 2013-09-12: qty 40

## 2013-09-12 MED ORDER — PLASMA-LYTE 148 IV SOLN
INTRAVENOUS | Status: DC
  Filled 2013-09-12: qty 2.5

## 2013-09-12 MED ORDER — SODIUM CHLORIDE 0.9 % IV SOLN
INTRAVENOUS | Status: AC
  Administered 2013-09-13: 69 mL/h via INTRAVENOUS
  Filled 2013-09-12: qty 40

## 2013-09-12 MED ORDER — PHENYLEPHRINE HCL 10 MG/ML IJ SOLN
30.0000 ug/min | INTRAVENOUS | Status: DC
  Filled 2013-09-12 (×2): qty 2

## 2013-09-12 MED ORDER — NITROGLYCERIN IN D5W 200-5 MCG/ML-% IV SOLN
2.0000 ug/min | INTRAVENOUS | Status: DC
  Filled 2013-09-12: qty 250

## 2013-09-12 MED ORDER — VANCOMYCIN HCL 1000 MG IV SOLR
INTRAVENOUS | Status: DC
  Filled 2013-09-12: qty 1000

## 2013-09-12 MED ORDER — VANCOMYCIN HCL 10 G IV SOLR
1250.0000 mg | INTRAVENOUS | Status: AC
  Administered 2013-09-13: 1250 mg via INTRAVENOUS
  Filled 2013-09-12: qty 1250

## 2013-09-12 MED ORDER — SODIUM CHLORIDE 0.9 % IV SOLN
INTRAVENOUS | Status: DC
  Filled 2013-09-12: qty 30

## 2013-09-12 MED ORDER — MAGNESIUM SULFATE 50 % IJ SOLN
40.0000 meq | INTRAMUSCULAR | Status: DC
  Filled 2013-09-12: qty 10

## 2013-09-12 MED ORDER — DOPAMINE-DEXTROSE 3.2-5 MG/ML-% IV SOLN
2.0000 ug/kg/min | INTRAVENOUS | Status: DC
  Filled 2013-09-12: qty 250

## 2013-09-12 MED ORDER — DEXMEDETOMIDINE HCL IN NACL 400 MCG/100ML IV SOLN
0.1000 ug/kg/h | INTRAVENOUS | Status: AC
  Administered 2013-09-13: 0.3 ug/kg/h via INTRAVENOUS
  Filled 2013-09-12: qty 100

## 2013-09-12 MED ORDER — CHLORHEXIDINE GLUCONATE 4 % EX LIQD
30.0000 mL | CUTANEOUS | Status: DC
  Filled 2013-09-12: qty 30

## 2013-09-12 MED ORDER — DEXTROSE 5 % IV SOLN
750.0000 mg | INTRAVENOUS | Status: DC
  Filled 2013-09-12 (×2): qty 750

## 2013-09-12 NOTE — H&P (Signed)
[address]       [name],[name] [id number]             [phone]          CARDIOTHORACIC SURGERY HISTORY AND PHYSICAL EXAM  Referring Provider is [name], Gregory Bors, MD PCP is Gregory Gainer, MD    Chief Complaint   Patient presents with   .  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 The patient has remained fairly active and healthy for all of his life. He has a long-standing history of hypertension in several years ago was found to have a heart murmur on routine physical exam. He was diagnosed with aortic stenosis and has been followed using serial echocardiograms. He was hospitalized in January following a particular severe case of the flu which progressed into pneumonia.  He states that he first developed flulike symptoms at the end of December. He suffered from a lingering productive cough for several weeks. Ultimately he developed somewhat of acute onset of shortness of breath with worsening productive cough and hemoptysis. He was hospitalized and treated with intravenous antibiotics. CT scan of the chest revealed findings consistent with pneumonia and notable for the absence of pulmonary embolus. Followup echocardiogram performed at that time demonstrated severe aortic stenosis with preserved left ventricular function. Left and right heart catheterization was performed and notable for the absence of significant coronary artery disease. Mean transvalvular gradient measured across the aortic valve it catheterization was 56 mm mercury.  The patient improved clinically and was seen in followup recently by Dr. Stanford [name]. Now that his pneumonia has completely resolved the patient was referred for possible elective aortic valve replacement.  He was  originally seen in consultation on 08/22/2013 at which time we made tentatively plans to proceed with surgery later this week.  Patient returns to the office today for followup prior to surgery scheduled for 09/13/2013. Over the last 2 weeks he reports no new problems or complaints. He specifically denies any fevers chills or productive cough.  He describes mild symptoms of exertional shortness of breath which remain unchanged. He is eager to proceed with surgery as previously planned.  The patient reports that he had remained physically active for most of his life although he admits that he is slow down a fair amount over the past year or so. He has had considerable problems with his eyesight, and he states that this has been the biggest problem which has begun to affect his physical activity. He initially had cataract surgery and suffered retinal detachment, and he now has blurry vision involving both eyes. He also states that he tires easily. He reports mild symptoms of exertional shortness of breath, probably NYHA functional class II.  However, he states that he really doesn't exert himself much anymore and as such he doesn't really notice his breathing that much. He has never had any chest discomfort or chest pressure with activity or rales. He denies any dizzy spells or syncope. He reports no history of PND, orthopnea, or lower extremity edema. He does admit to some problems with short-term memory disturbance, although this has not become problematic from a practical standpoint.   Past Medical History  Diagnosis Date  . Cerebrovascular disease, unspecified   . Aortic  valve disorders     stenosis  . HLD (hyperlipidemia)   . HTN (hypertension)   . CAD (coronary artery disease)   . Pneumonia July 23, 2013  . HOH (hard of hearing)     wears bilateral hearing aids  . Skin cancer of face     removed from nose  . Glaucoma     Bilateral eyes    Past Surgical History  Procedure Laterality Date   . Carotid endarterectomy Right   . Cardiac catheterization      no PCI  . Colonoscopy w/ polypectomy    . Cataract extraction Bilateral   . Retinal detachment surgery    . Eye surgery      Family History  Problem Relation Age of Onset  . Colon cancer Father   . Heart failure Mother     CHF     Social History History  Substance Use Topics  . Smoking status: Never Smoker   . Smokeless tobacco: Not on file     Comment: tobacco use - no  . Alcohol Use: No    Prior to Admission medications   Medication Sig Start Date End Date Taking? Authorizing Provider  dorzolamide-timolol (COSOPT) 22.3-6.8 MG/ML ophthalmic solution Place 1 drop into both eyes 2 (two) times daily.  06/26/13  Yes Historical Provider, MD  ezetimibe (ZETIA) 10 MG tablet Take 10 mg by mouth daily.   Yes Historical Provider, MD  guaiFENesin (MUCINEX) 600 MG 12 hr tablet Take 600 mg by mouth 2 (two) times daily as needed for cough or to loosen phlegm.    Yes Historical Provider, MD  metoprolol succinate (TOPROL-XL) 50 MG 24 hr tablet Take 50 mg by mouth daily. Take with or immediately following a meal.   Yes Historical Provider, MD  niacin (NIASPAN) 1000 MG CR tablet Take 1,000 mg by mouth at bedtime.   Yes Historical Provider, MD  ramipril (ALTACE) 5 MG capsule Take 5 mg by mouth daily.   Yes Historical Provider, MD  Travoprost, BAK Free, (TRAVATAN) 0.004 % SOLN ophthalmic solution Place 1 drop into both eyes at bedtime.   Yes Historical Provider, MD  aspirin 325 MG EC tablet Take 325 mg by mouth daily.      Historical Provider, MD    Allergies  Allergen Reactions  . Levaquin [Levofloxacin]     RASH    Review of Systems:              General:                      decreased appetite, decreased energy, no weight gain, + weight loss, no fever             Cardiac:                      no chest pain with exertion, no chest pain at rest, + SOB with exertion, no resting SOB, no PND, no orthopnea, no palpitations, no  arrhythmia, no atrial fibrillation, no LE edema, no dizzy spells, no syncope             Respiratory:                no shortness of breath, no home oxygen, no current productive cough, no dry cough, no bronchitis, no wheezing, no recent hemoptysis, no asthma, no pain with inspiration or cough, no sleep apnea, no CPAP at night  GI:                                no difficulty swallowing, no reflux, no frequent heartburn, no hiatal hernia, no abdominal pain, no constipation, no diarrhea, no hematochezia, no hematemesis, no melena             GU:                              no dysuria,  no frequency, no urinary tract infection, no hematuria, no enlarged prostate, no kidney stones, no kidney disease             Vascular:                     no pain suggestive of claudication, no pain in feet, no leg cramps, no varicose veins, no DVT, no non-healing foot ulcer             Neuro:                         no stroke, no TIA's, no seizures, no headaches, no temporary blindness one eye,  no slurred speech, no peripheral neuropathy, no chronic pain, no instability of gait, + significant memory/cognitive dysfunction             Musculoskeletal:         no arthritis, no joint swelling, no myalgias, no difficulty walking, normal mobility               Skin:                            no rash, no itching, no skin infections, no pressure sores or ulcerations             Psych:                         no anxiety, no depression, no nervousness, no unusual recent stress             Eyes:                           + blurry vision, no floaters, + recent vision changes, + wears glasses or contacts             ENT:                            + hearing loss, no loose or painful teeth, no dentures, last saw dentist 2012             Hematologic:               + easy bruising, no abnormal bleeding, no clotting disorder, no frequent epistaxis             Endocrine:                   no diabetes, does not check CBG's at  home                           Physical Exam:  BP 144/67  Pulse 73  Resp 16  Ht 5\' 8"  (1.727 m)  Wt 140 lb (63.504 kg)  BMI 21.29 kg/m2  SpO2 94%             General:                      Thin but o/w well-appearing             HEENT:                       Unremarkable               Neck:                           no JVD, no bruits, no adenopathy               Chest:                         clear to auscultation, symmetrical breath sounds, no wheezes, no rhonchi               CV:                              RRR, grade III/VI crescendo/decrescendo systolic murmur               Abdomen:                    soft, non-tender, no masses               Extremities:                 warm, well-perfused, pulses not palpable, no LE edema, + varicose veins R>L             Rectal/GU                   Deferred             Neuro:                         Grossly non-focal and symmetrical throughout             Skin:                            Clean and dry, no rashes, no breakdown   Diagnostic Tests:  Transthoracic Echocardiography  Patient:    [name], [name] MR #:       [id number] Study Date: 07/24/2013 Gender:     M Age:        67 Height:     172.7cm Weight:     65.5kg BSA:        1.44m^2 Pt. Status: Room:       3W11C    Rhodia Albright  SONOGRAPHER  Donata Clay  PERFORMING   Chmg, Inpatient cc:  ------------------------------------------------------------ LV EF: 60% -   65%  ------------------------------------------------------------ Indications:      MI - acute 410.91.  ------------------------------------------------------------ History:   PMH:   Dyspnea.  Aortic valve disease.  Mitral valve disease.  Risk factors:  Hypertension. Dyslipidemia.   ------------------------------------------------------------ Study Conclusions  - Left ventricle: Poor acoustic windows  limit study [place]   LVEF appears moderately depressed with    hypokinesis/akinesis of the inferior, posterior and   inferolatel The cavity size was normal. Systolic function   was normal. The estimated ejection fraction was in the   range of 60% to 65%. Doppler parameters are consistent   with abnormal left ventricular relaxation (grade 1   diastolic dysfunction). - Aortic valve: AV is not well visualized. Appears   thickened. Peak and mean gradients through the valve are   82 and 52 mm Hg respectively consistent with   severe/critical AS. Transthoracic echocardiography.  M-mode, complete 2D, spectral Doppler, and color Doppler.  Height:  Height: 172.7cm. Height: 68in.  Weight:  Weight: 65.5kg. Weight: 144lb.  Body mass index:  BMI: 21.9kg/m^2.  Body surface area:    BSA: 1.42m^2.  Blood pressure:     168/58.  Patient status:  Inpatient.  Location:  Bedside.  ------------------------------------------------------------  ------------------------------------------------------------ Left ventricle:  Poor acoustic windows limit study [place] LVEF appears moderately depressed with hypokinesis/akinesis of the inferior, posterior and inferolatel The cavity size was normal. Systolic function was normal. The estimated ejection fraction was in the range of 60% to 65%. Doppler parameters are consistent with abnormal left ventricular relaxation (grade 1 diastolic dysfunction).  ------------------------------------------------------------ Aortic valve:  AV is not well visualized. Appears thickened. Peak and mean gradients through the valve are 82 and 52 mm Hg respectively consistent with severe/critical AS. Doppler:   No significant regurgitation.    VTI ratio of LVOT to aortic valve: 0.22. Valve area: 0.69cm^2(VTI). Indexed valve area: 0.39cm^2/m^2 (VTI). Peak velocity ratio of LVOT to aortic valve: 0.2. Valve area: 0.63cm^2 (Vmax). Indexed valve area: 0.36cm^2/m^2 (Vmax).    Mean gradient: 50mm Hg (S). Peak gradient: 77mm Hg  (S).  ------------------------------------------------------------ Mitral valve:   Calcified annulus. Mildly thickened leaflets .  Doppler:   No significant regurgitation.    Valve area by continuity equation (using LVOT flow): 1.85cm^2. Indexed valve area by continuity equation (using LVOT flow): 1.04cm^2/m^2.    Mean gradient: 5mm Hg (D). Peak gradient: 11mm Hg (D).  ------------------------------------------------------------ Left atrium:  LA appears mildly dilated.  ------------------------------------------------------------ Right ventricle:  The cavity size was normal. Wall thickness was normal. Systolic function was normal.  ------------------------------------------------------------ Tricuspid valve:   Structurally normal valve.   Leaflet separation was normal.  Doppler:  Transvalvular velocity was within the normal range.  Trivial regurgitation.  ------------------------------------------------------------ Right atrium:  The atrium was normal in size.  ------------------------------------------------------------ Pericardium:  Small pericardial effusion around apex  ------------------------------------------------------------  2D measurements        Normal  Doppler measurements   Normal Left ventricle                 LVOT LVID ED,   41.8 mm     43-52   Peak vel,  88.6 cm/s   ------ chord,                         S PLAX                           VTI, S     22.6 cm     ------ LVID ES,   35.7 mm     23-38   Aortic valve chord,                         Peak vel,  440 cm/s   ------ PLAX                           S FS,          15 %      >29     Mean vel,   329 cm/s   ------ chord,                         S PLAX                           VTI, S      103 cm     ------ LVPW, ED   10.2 mm     ------  Mean         50 mm Hg  ------ IVS/LVPW   1.05        <1.3    gradient, ratio, ED                      S Ventricular septum             Peak         77 mm Hg  ------ IVS, ED     10.7 mm     ------  gradient, LVOT                           S Diam, S      20 mm     ------  VTI ratio  0.22        ------ Area       3.14 cm^2   ------  LVOT/AV Aorta                          Area, VTI  0.69 cm^2   ------ Root         32 mm     ------  Area index 0.39 cm^2/m ------ diam, ED                       (VTI)           ^2 Left atrium                    Peak vel    0.2        ------ AP dim    40.88 mm     ------  ratio, AP dim     2.31 cm/m^2 <2.2    LVOT/AV index                          Area, Vmax 0.63 cm^2   ------                                Area index 0.36 cm^2/m ------                                (Vmax)          Kingsley Callander  Mitral valve                                Peak E vel  104 cm/s   ------                                Peak A vel  147 cm/s   ------                                Mean vel,   104 cm/s   ------                                D                                Decelerati  229 ms     150-23                                on time                0                                Mean          5 mm Hg  ------                                gradient,                                D                                Peak         11 mm Hg  ------                                gradient,                                D                                Peak E/A    0.7        ------                                ratio                                Area       1.85 cm^2   ------                                (  LVOT)                                continuity                                Area index 1.04 cm^2/m ------                                (LVOT           ^2                                cont)                                Annulus    38.3 cm     ------                                VTI                                Systemic veins                                Estimated     8 mm Hg  ------                                 CVP                                Right ventricle                                Aa vel,    18.4 cm/s   ------                                lat ann,                                tiss DP   ------------------------------------------------------------ Prepared and Electronically Authenticated by  Dorris Carnes 2015-01-26T12:26:40.080       Left and Right Heart Catheterization with Coronary Angiography  Report   [name]  80 y.o.   male 10-07-1929  Procedure Date: 07/26/2013  Referring Physician: C. Angelena Form, MD Primary Cardiologist:: Kirk Ruths, M.D. Primary Care Physician: [name], M.D.  INDICATIONS: Non-ST elevation myocardial infarction and aortic stenosis  PROCEDURE: 1. Left heart catheterization; 2. Right heart catheterization; 3. Coronary angiography; 4. Left ventriculography  CONSENT:   The risks, benefits, and details of the procedure were explained in detail to the patient. Risks including death, stroke, heart attack, kidney injury, allergy, limb ischemia, bleeding and radiation injury were discussed.  The patient verbalized understanding and wanted to proceed.  Informed written consent  was obtained.  PROCEDURE TECHNIQUE:  After Xylocaine anesthesia a 5 French sheath was placed in the right femoral artery with a single anterior needle wall stick. A 7 French sheath was placed in the right femoral vein also with a single anterior wall stick. Right heart catheterization was performed with a 6 French Swan-Ganz catheter. Coronary angiography was done using a left and right 4 cm Judkins catheters.  Left ventriculography was done using the 5 [place] JR 4 diagnostic catheter using hand injection.   Hemostasis was achieved with manual compression.  MEDICATIONS: Versed 1 mg IV and fentanyl 50 mcg IV  CONTRAST:  Total of 100 cc.  COMPLICATIONS:  none   HEMODYNAMICS:  Aortic pressure 114/45 mmHg; LV pressure 170/13 mmHg; LVEDP 10 mmHg, RA 4 mmHg mean, RV 37/5 mmHg,  PA 34/14 mmHg, PCWP(mean) 6 mmHg , Cardiac Output 3.13 L per minute by thermodilution and 4.56 L per minute using Fick; AV gradient peak to peak 65 mmHg with a mean gradient of 56 mm mercury; aortic valve area using thermodilution cardiac output is 0.42 cm square and 0.61 cm square using the Fick cardiac output  ANGIOGRAPHIC DATA:   The left main coronary artery is short and free of significant obstruction. Heavy calcification is noted. No catheter damping is noted with engagement..  The left anterior descending artery is proximal mid and distal eccentric 50% stenoses. No high-grade obstruction is noted. A large diagonal vessel is also free of obstruction..  The left circumflex artery is large and widely patent.  The right coronary artery is dominant and normal..   LEFT VENTRICULOGRAM:  Left ventricular angiogram was done in the 30 RAO projection and revealed normal cavity size with normal function and estimated ejection fraction of 60%  CINEFLUOROSCOPY AORTIC VALVE: heavy leaflet calcification, mitral annular calcification, and mild aortic root calcification.  IMPRESSIONS:  1. Critical aortic stenosis, calcific. 2. Mild to moderate left anterior descending coronary artery disease. No significant obstruction is present. The left main is calcified but has no significant obstruction. The right coronary and circumflex are widely patent and essentially normal for age 3. Normal left ventricular systolic function   RECOMMENDATION:  Consideration of aortic valve surgery or TVR depending upon clinical features and heart valve team opinion.            STS Risk Calculator  Procedure                                         AVR  Risk of Mortality                                2.0% Morbidity or Mortality                       14.6% Prolonged LOS                                   4.5% Short LOS                                           36.4% Permanent Stroke  2.0% Prolonged Vent Support                      7.5% DSW Infection                                     0.2% Renal Failure                                       3.8% Reoperation                                        8.2%   Impression:  Patient has severe symptomatic aortic stenosis with normal left ventricular systolic function. Risks associated with conventional surgical aortic valve replacement should be reasonably low.  I agree that he would likely best be treated with conventional surgical aortic valve replacement.  However, the patient does report significant problems with short-term memory difficulty as well as significant functional decline over the past year which he attributes primarily to worsening eyesight.  I suspect that he may have some mild dementia, and he may be at somewhat increased risk for postoperative confusion or delirium.    Plan:  The patient and his family were counseled at length regarding treatment alternatives for management of severe symptomatic aortic stenosis. Alternative approaches such as conventional aortic valve replacement, transcatheter aortic valve replacement, and long term medical therapy were compared and contrasted at length.  The risks associated with conventional surgical aortic valve replacement were been discussed in detail, as were expectations for post-operative convalescence. Long-term prognosis with medical therapy was discussed. This discussion was placed in the context of the patient's own specific clinical presentation and past medical history.   Discussion was held comparing the relative risks of mechanical valve replacement with need for lifelong anticoagulation versus use of a bioprosthetic tissue valve and the associated potential for late structural valve deterioration in failure.  This discussion was placed in the context of the patient's particular circumstances, and as a result the patient specifically requests that their valve be replaced  using a bioprosthetic tissue valve.  They understand and accept all potential risks of surgery including but not limited to risk of death, stroke or other neurologic complication, myocardial infarction, congestive heart failure, respiratory failure, renal failure, bleeding requiring transfusion and/or reexploration, arrhythmia, infection or other wound complications, pneumonia, pleural and/or pericardial effusion, pulmonary embolus, aortic dissection or other major vascular complication, or delayed complications related to valve repair or replacement including but not limited to structural valve deterioration and failure, thrombosis, embolization, endocarditis, or paravalvular leak.  All of their questions have been answered.  We plan to proceed with surgery on Wednesday, 09/13/2013.      Valentina Gu. Roxy Manns, MD

## 2013-09-13 ENCOUNTER — Encounter (HOSPITAL_COMMUNITY): Payer: Self-pay | Admitting: *Deleted

## 2013-09-13 ENCOUNTER — Encounter (HOSPITAL_COMMUNITY)
Admission: RE | Disposition: A | Discharge status: Home / Self Care | Payer: Medicare Other | Source: Ambulatory Visit | Attending: Thoracic Surgery (Cardiothoracic Vascular Surgery)

## 2013-09-13 ENCOUNTER — Inpatient Hospital Stay (HOSPITAL_COMMUNITY): Payer: Medicare Other

## 2013-09-13 ENCOUNTER — Encounter (HOSPITAL_COMMUNITY): Payer: Medicare Other | Admitting: Vascular Surgery

## 2013-09-13 ENCOUNTER — Inpatient Hospital Stay (HOSPITAL_COMMUNITY)
Admission: RE | Admit: 2013-09-13 | Discharge: 2013-09-19 | DRG: 220 | Disposition: A | Discharge status: Home / Self Care | Payer: Medicare Other | Source: Ambulatory Visit | Attending: Thoracic Surgery (Cardiothoracic Vascular Surgery) | Admitting: Thoracic Surgery (Cardiothoracic Vascular Surgery)

## 2013-09-13 ENCOUNTER — Inpatient Hospital Stay (HOSPITAL_COMMUNITY): Payer: Medicare Other | Admitting: Certified Registered"

## 2013-09-13 ENCOUNTER — Encounter (HOSPITAL_COMMUNITY): Payer: Medicare Other

## 2013-09-13 DIAGNOSIS — I10 Essential (primary) hypertension: Secondary | ICD-10-CM | POA: Diagnosis present

## 2013-09-13 DIAGNOSIS — E8779 Other fluid overload: Secondary | ICD-10-CM | POA: Diagnosis not present

## 2013-09-13 DIAGNOSIS — Z79899 Other long term (current) drug therapy: Secondary | ICD-10-CM

## 2013-09-13 DIAGNOSIS — K449 Diaphragmatic hernia without obstruction or gangrene: Secondary | ICD-10-CM | POA: Diagnosis present

## 2013-09-13 DIAGNOSIS — I4891 Unspecified atrial fibrillation: Secondary | ICD-10-CM | POA: Diagnosis not present

## 2013-09-13 DIAGNOSIS — J988 Other specified respiratory disorders: Secondary | ICD-10-CM | POA: Diagnosis not present

## 2013-09-13 DIAGNOSIS — D62 Acute posthemorrhagic anemia: Secondary | ICD-10-CM | POA: Diagnosis not present

## 2013-09-13 DIAGNOSIS — Y9210 Unspecified residential institution as the place of occurrence of the external cause: Secondary | ICD-10-CM | POA: Diagnosis not present

## 2013-09-13 DIAGNOSIS — I359 Nonrheumatic aortic valve disorder, unspecified: Secondary | ICD-10-CM

## 2013-09-13 DIAGNOSIS — J9819 Other pulmonary collapse: Secondary | ICD-10-CM | POA: Diagnosis not present

## 2013-09-13 DIAGNOSIS — I350 Nonrheumatic aortic (valve) stenosis: Secondary | ICD-10-CM | POA: Diagnosis present

## 2013-09-13 DIAGNOSIS — E785 Hyperlipidemia, unspecified: Secondary | ICD-10-CM | POA: Diagnosis present

## 2013-09-13 DIAGNOSIS — Y831 Surgical operation with implant of artificial internal device as the cause of abnormal reaction of the patient, or of later complication, without mention of misadventure at the time of the procedure: Secondary | ICD-10-CM | POA: Diagnosis not present

## 2013-09-13 DIAGNOSIS — D696 Thrombocytopenia, unspecified: Secondary | ICD-10-CM | POA: Diagnosis present

## 2013-09-13 DIAGNOSIS — Z953 Presence of xenogenic heart valve: Secondary | ICD-10-CM

## 2013-09-13 DIAGNOSIS — D689 Coagulation defect, unspecified: Secondary | ICD-10-CM | POA: Diagnosis present

## 2013-09-13 DIAGNOSIS — Z7982 Long term (current) use of aspirin: Secondary | ICD-10-CM

## 2013-09-13 HISTORY — PX: INTRAOPERATIVE TRANSESOPHAGEAL ECHOCARDIOGRAM: SHX5062

## 2013-09-13 HISTORY — DX: Presence of xenogenic heart valve: Z95.3

## 2013-09-13 HISTORY — PX: AORTIC VALVE REPLACEMENT: SHX41

## 2013-09-13 LAB — POCT I-STAT 3, ART BLOOD GAS (G3+)
ACID-BASE DEFICIT: 2 mmol/L (ref 0.0–2.0)
Acid-base deficit: 3 mmol/L — ABNORMAL HIGH (ref 0.0–2.0)
BICARBONATE: 21.9 meq/L (ref 20.0–24.0)
BICARBONATE: 20.9 meq/L (ref 20.0–24.0)
O2 SAT: 100 %
O2 Saturation: 100 %
PCO2 ART: 28.1 mmHg — AB (ref 35.0–45.0)
Patient temperature: 35.2
TCO2: 23 mmol/L (ref 0–100)
TCO2: 22 mmol/L (ref 0–100)
pCO2 arterial: 34.6 mmHg — ABNORMAL LOW (ref 35.0–45.0)
pH, Arterial: 7.409 (ref 7.350–7.450)
pH, Arterial: 7.471 — ABNORMAL HIGH (ref 7.350–7.450)
pO2, Arterial: 308 mmHg — ABNORMAL HIGH (ref 80.0–100.0)
pO2, Arterial: 177 mmHg — ABNORMAL HIGH (ref 80.0–100.0)

## 2013-09-13 LAB — HEMOGLOBIN AND HEMATOCRIT, BLOOD
HCT: 25.2 % — ABNORMAL LOW (ref 39.0–52.0)
Hemoglobin: 8.7 g/dL — ABNORMAL LOW (ref 13.0–17.0)

## 2013-09-13 LAB — CBC
HEMATOCRIT: 23.6 % — AB (ref 39.0–52.0)
HEMATOCRIT: 26.2 % — AB (ref 39.0–52.0)
HEMOGLOBIN: 9.2 g/dL — AB (ref 13.0–17.0)
Hemoglobin: 8.3 g/dL — ABNORMAL LOW (ref 13.0–17.0)
MCH: 33.7 pg (ref 26.0–34.0)
MCH: 31.7 pg (ref 26.0–34.0)
MCHC: 35.2 g/dL (ref 30.0–36.0)
MCHC: 35.1 g/dL (ref 30.0–36.0)
MCV: 95.9 fL (ref 78.0–100.0)
MCV: 90.3 fL (ref 78.0–100.0)
Platelets: 123 10*3/uL — ABNORMAL LOW (ref 150–400)
Platelets: 157 10*3/uL (ref 150–400)
RBC: 2.46 MIL/uL — ABNORMAL LOW (ref 4.22–5.81)
RBC: 2.9 MIL/uL — ABNORMAL LOW (ref 4.22–5.81)
RDW: 13.4 % (ref 11.5–15.5)
RDW: 15.4 % (ref 11.5–15.5)
WBC: 3.9 10*3/uL — ABNORMAL LOW (ref 4.0–10.5)
WBC: 4 10*3/uL (ref 4.0–10.5)

## 2013-09-13 LAB — POCT I-STAT 4, (NA,K, GLUC, HGB,HCT)
GLUCOSE: 109 mg/dL — AB (ref 70–99)
GLUCOSE: 119 mg/dL — AB (ref 70–99)
GLUCOSE: 100 mg/dL — AB (ref 70–99)
GLUCOSE: 123 mg/dL — AB (ref 70–99)
Glucose, Bld: 119 mg/dL — ABNORMAL HIGH (ref 70–99)
Glucose, Bld: 96 mg/dL (ref 70–99)
HCT: 31 % — ABNORMAL LOW (ref 39.0–52.0)
HCT: 32 % — ABNORMAL LOW (ref 39.0–52.0)
HCT: 23 % — ABNORMAL LOW (ref 39.0–52.0)
HCT: 23 % — ABNORMAL LOW (ref 39.0–52.0)
HEMATOCRIT: 25 % — AB (ref 39.0–52.0)
HEMATOCRIT: 22 % — AB (ref 39.0–52.0)
HEMOGLOBIN: 10.5 g/dL — AB (ref 13.0–17.0)
HEMOGLOBIN: 7.8 g/dL — AB (ref 13.0–17.0)
HEMOGLOBIN: 8.5 g/dL — AB (ref 13.0–17.0)
HEMOGLOBIN: 7.8 g/dL — AB (ref 13.0–17.0)
Hemoglobin: 10.9 g/dL — ABNORMAL LOW (ref 13.0–17.0)
Hemoglobin: 7.5 g/dL — ABNORMAL LOW (ref 13.0–17.0)
POTASSIUM: 3.6 meq/L — AB (ref 3.7–5.3)
POTASSIUM: 3.7 meq/L (ref 3.7–5.3)
POTASSIUM: 3.3 meq/L — AB (ref 3.7–5.3)
Potassium: 3.5 mEq/L — ABNORMAL LOW (ref 3.7–5.3)
Potassium: 3.4 mEq/L — ABNORMAL LOW (ref 3.7–5.3)
Potassium: 4.1 mEq/L (ref 3.7–5.3)
SODIUM: 142 meq/L (ref 137–147)
SODIUM: 136 meq/L — AB (ref 137–147)
SODIUM: 141 meq/L (ref 137–147)
Sodium: 141 mEq/L (ref 137–147)
Sodium: 140 mEq/L (ref 137–147)
Sodium: 144 mEq/L (ref 137–147)

## 2013-09-13 LAB — POCT I-STAT, CHEM 8
BUN: 13 mg/dL (ref 6–23)
CHLORIDE: 107 meq/L (ref 96–112)
Calcium, Ion: 1.08 mmol/L — ABNORMAL LOW (ref 1.13–1.30)
Creatinine, Ser: 0.8 mg/dL (ref 0.50–1.35)
Glucose, Bld: 149 mg/dL — ABNORMAL HIGH (ref 70–99)
HCT: 30 % — ABNORMAL LOW (ref 39.0–52.0)
HEMOGLOBIN: 10.2 g/dL — AB (ref 13.0–17.0)
POTASSIUM: 4.1 meq/L (ref 3.7–5.3)
Sodium: 143 mEq/L (ref 137–147)
TCO2: 20 mmol/L (ref 0–100)

## 2013-09-13 LAB — CREATININE, SERUM
CREATININE: 0.78 mg/dL (ref 0.50–1.35)
GFR calc Af Amer: >90 mL/min (ref >90)
GFR, EST NON AFRICAN AMERICAN: 82 mL/min — AB (ref >90)

## 2013-09-13 LAB — PROTIME-INR
INR: 1.58 — ABNORMAL HIGH (ref 0.00–1.49)
Prothrombin Time: 18.4 seconds — ABNORMAL HIGH (ref 11.6–15.2)

## 2013-09-13 LAB — GLUCOSE, CAPILLARY
GLUCOSE-CAPILLARY: 121 mg/dL — AB (ref 70–99)
Glucose-Capillary: 96 mg/dL (ref 70–99)
Glucose-Capillary: 120 mg/dL — ABNORMAL HIGH (ref 70–99)
Glucose-Capillary: 131 mg/dL — ABNORMAL HIGH (ref 70–99)
Glucose-Capillary: 119 mg/dL — ABNORMAL HIGH (ref 70–99)
Glucose-Capillary: 132 mg/dL — ABNORMAL HIGH (ref 70–99)
Glucose-Capillary: 118 mg/dL — ABNORMAL HIGH (ref 70–99)
Glucose-Capillary: 160 mg/dL — ABNORMAL HIGH (ref 70–99)
Glucose-Capillary: 169 mg/dL — ABNORMAL HIGH (ref 70–99)

## 2013-09-13 LAB — PLATELET COUNT: Platelets: 66 10*3/uL — ABNORMAL LOW (ref 150–400)

## 2013-09-13 LAB — PREPARE RBC (CROSSMATCH)

## 2013-09-13 LAB — APTT: aPTT: 55 seconds — ABNORMAL HIGH (ref 24–37)

## 2013-09-13 LAB — MAGNESIUM: Magnesium: 2.6 mg/dL — ABNORMAL HIGH (ref 1.5–2.5)

## 2013-09-13 SURGERY — REPLACEMENT, AORTIC VALVE, OPEN
Anesthesia: General | Site: Chest

## 2013-09-13 MED ORDER — POTASSIUM CHLORIDE 10 MEQ/50ML IV SOLN
10.0000 meq | INTRAVENOUS | Status: AC
  Administered 2013-09-13 (×2): 10 meq via INTRAVENOUS

## 2013-09-13 MED ORDER — SODIUM CHLORIDE 0.9 % IJ SOLN
OROMUCOSAL | Status: DC | PRN
  Administered 2013-09-13 (×4): via TOPICAL

## 2013-09-13 MED ORDER — SODIUM CHLORIDE 0.9 % IV SOLN: 250.0000 mL | INTRAVENOUS | Status: AC

## 2013-09-13 MED ORDER — MORPHINE SULFATE 2 MG/ML IJ SOLN
1.0000 mg | INTRAMUSCULAR | Status: AC | PRN
  Administered 2013-09-13: 4 mg via INTRAVENOUS
  Administered 2013-09-13: 2 mg via INTRAVENOUS
  Filled 2013-09-13: qty 2

## 2013-09-13 MED ORDER — PROPOFOL 10 MG/ML IV BOLUS
INTRAVENOUS | Status: DC | PRN
  Administered 2013-09-13: 50 mg via INTRAVENOUS

## 2013-09-13 MED ORDER — PANTOPRAZOLE SODIUM 40 MG PO TBEC
40.0000 mg | DELAYED_RELEASE_TABLET | Freq: Every day | ORAL | Status: DC
  Administered 2013-09-15 – 2013-09-19 (×5): 40 mg via ORAL
  Filled 2013-09-13 (×5): qty 1

## 2013-09-13 MED ORDER — MIDAZOLAM HCL 2 MG/2ML IJ SOLN
INTRAMUSCULAR | Status: AC
  Filled 2013-09-13: qty 2

## 2013-09-13 MED ORDER — FENTANYL CITRATE 0.05 MG/ML IJ SOLN
INTRAMUSCULAR | Status: AC
  Filled 2013-09-13: qty 5

## 2013-09-13 MED ORDER — SODIUM CHLORIDE 0.9 % IV SOLN
INTRAVENOUS | Status: DC
  Administered 2013-09-13: 14:00:00 via INTRAVENOUS

## 2013-09-13 MED ORDER — ROCURONIUM BROMIDE 100 MG/10ML IV SOLN
INTRAVENOUS | Status: DC | PRN
  Administered 2013-09-13: 50 mg via INTRAVENOUS
  Administered 2013-09-13: 20 mg via INTRAVENOUS
  Administered 2013-09-13: 50 mg via INTRAVENOUS

## 2013-09-13 MED ORDER — ACETAMINOPHEN 650 MG RE SUPP
650.0000 mg | Freq: Once | RECTAL | Status: AC
  Administered 2013-09-13: 650 mg via RECTAL

## 2013-09-13 MED ORDER — DEXTROSE 5 % IV SOLN
1.5000 g | Freq: Two times a day (BID) | INTRAVENOUS | Status: AC
  Administered 2013-09-13 – 2013-09-15 (×4): 1.5 g via INTRAVENOUS
  Filled 2013-09-13 (×4): qty 1.5

## 2013-09-13 MED ORDER — DORZOLAMIDE HCL-TIMOLOL MAL 2-0.5 % OP SOLN
1.0000 [drp] | Freq: Two times a day (BID) | OPHTHALMIC | Status: DC
  Administered 2013-09-13 – 2013-09-19 (×12): 1 [drp] via OPHTHALMIC
  Filled 2013-09-13: qty 10

## 2013-09-13 MED ORDER — PROTAMINE SULFATE 10 MG/ML IV SOLN
INTRAVENOUS | Status: AC
  Filled 2013-09-13: qty 25

## 2013-09-13 MED ORDER — LIDOCAINE HCL (CARDIAC) 20 MG/ML IV SOLN
INTRAVENOUS | Status: DC | PRN
  Administered 2013-09-13: 60 mg via INTRAVENOUS
  Administered 2013-09-13: 40 mg via INTRAVENOUS

## 2013-09-13 MED ORDER — MIDAZOLAM HCL 10 MG/2ML IJ SOLN
INTRAMUSCULAR | Status: AC
  Filled 2013-09-13: qty 2

## 2013-09-13 MED ORDER — MIDAZOLAM HCL 5 MG/5ML IJ SOLN
INTRAMUSCULAR | Status: DC | PRN
  Administered 2013-09-13: 5 mg via INTRAVENOUS
  Administered 2013-09-13 (×2): 1 mg via INTRAVENOUS
  Administered 2013-09-13: 2 mg via INTRAVENOUS
  Administered 2013-09-13: 3 mg via INTRAVENOUS

## 2013-09-13 MED ORDER — ACETAMINOPHEN 500 MG PO TABS
1000.0000 mg | ORAL_TABLET | Freq: Four times a day (QID) | ORAL | Status: AC
  Administered 2013-09-14 – 2013-09-18 (×11): 1000 mg via ORAL
  Filled 2013-09-13 (×18): qty 2

## 2013-09-13 MED ORDER — ROCURONIUM BROMIDE 50 MG/5ML IV SOLN
INTRAVENOUS | Status: AC
  Filled 2013-09-13: qty 1

## 2013-09-13 MED ORDER — VANCOMYCIN HCL IN DEXTROSE 1-5 GM/200ML-% IV SOLN
1000.0000 mg | Freq: Once | INTRAVENOUS | Status: AC
  Administered 2013-09-13: 1000 mg via INTRAVENOUS
  Filled 2013-09-13: qty 200

## 2013-09-13 MED ORDER — DOCUSATE SODIUM 100 MG PO CAPS
200.0000 mg | ORAL_CAPSULE | Freq: Every day | ORAL | Status: DC
  Administered 2013-09-14 – 2013-09-19 (×4): 200 mg via ORAL
  Filled 2013-09-13 (×6): qty 2

## 2013-09-13 MED ORDER — HEPARIN SODIUM (PORCINE) 1000 UNIT/ML IJ SOLN
INTRAMUSCULAR | Status: AC
  Filled 2013-09-13: qty 1

## 2013-09-13 MED ORDER — 0.9 % SODIUM CHLORIDE (POUR BTL) OPTIME
TOPICAL | Status: DC | PRN
  Administered 2013-09-13: 6000 mL

## 2013-09-13 MED ORDER — ALBUMIN HUMAN 5 % IV SOLN
INTRAVENOUS | Status: DC | PRN
  Administered 2013-09-13: 13:00:00 via INTRAVENOUS

## 2013-09-13 MED ORDER — SODIUM CHLORIDE 0.9 % IJ SOLN
3.0000 mL | Freq: Two times a day (BID) | INTRAMUSCULAR | Status: DC
  Administered 2013-09-14: 3 mL via INTRAVENOUS
  Administered 2013-09-14: 10 mL via INTRAVENOUS
  Administered 2013-09-15 (×2): 3 mL via INTRAVENOUS

## 2013-09-13 MED ORDER — OXYCODONE HCL 5 MG PO TABS
5.0000 mg | ORAL_TABLET | ORAL | Status: DC | PRN
  Administered 2013-09-14: 5 mg via ORAL
  Filled 2013-09-13: qty 1

## 2013-09-13 MED ORDER — POTASSIUM CHLORIDE 10 MEQ/50ML IV SOLN
10.0000 meq | INTRAVENOUS | Status: AC
  Administered 2013-09-13 (×3): 10 meq via INTRAVENOUS

## 2013-09-13 MED ORDER — LACTATED RINGERS IV SOLN: 500.0000 mL | Freq: Once | INTRAVENOUS | Status: AC | PRN

## 2013-09-13 MED ORDER — FENTANYL CITRATE 0.05 MG/ML IJ SOLN
INTRAMUSCULAR | Status: DC | PRN
  Administered 2013-09-13: 50 ug via INTRAVENOUS
  Administered 2013-09-13: 100 ug via INTRAVENOUS
  Administered 2013-09-13: 150 ug via INTRAVENOUS
  Administered 2013-09-13: 250 ug via INTRAVENOUS
  Administered 2013-09-13: 100 ug via INTRAVENOUS
  Administered 2013-09-13: 200 ug via INTRAVENOUS
  Administered 2013-09-13: 150 ug via INTRAVENOUS
  Administered 2013-09-13: 250 ug via INTRAVENOUS

## 2013-09-13 MED ORDER — LACTATED RINGERS IV SOLN
INTRAVENOUS | Status: DC | PRN
  Administered 2013-09-13: 09:00:00 via INTRAVENOUS

## 2013-09-13 MED ORDER — HEPARIN SODIUM (PORCINE) 1000 UNIT/ML IJ SOLN
INTRAMUSCULAR | Status: DC | PRN
  Administered 2013-09-13: 18000 [IU] via INTRAVENOUS

## 2013-09-13 MED ORDER — INSULIN REGULAR BOLUS VIA INFUSION
0.0000 [IU] | Freq: Three times a day (TID) | INTRAVENOUS | Status: DC
  Filled 2013-09-13: qty 10

## 2013-09-13 MED ORDER — METOPROLOL TARTRATE 25 MG/10 ML ORAL SUSPENSION
12.5000 mg | Freq: Two times a day (BID) | ORAL | Status: DC
  Filled 2013-09-13 (×3): qty 5

## 2013-09-13 MED ORDER — SODIUM CHLORIDE 0.45 % IV SOLN
INTRAVENOUS | Status: DC
  Administered 2013-09-13: 15:00:00 via INTRAVENOUS

## 2013-09-13 MED ORDER — PROPOFOL 10 MG/ML IV BOLUS
INTRAVENOUS | Status: AC
  Filled 2013-09-13: qty 20

## 2013-09-13 MED ORDER — METOPROLOL TARTRATE 12.5 MG HALF TABLET
12.5000 mg | ORAL_TABLET | Freq: Two times a day (BID) | ORAL | Status: DC
  Administered 2013-09-14 (×2): 12.5 mg via ORAL
  Filled 2013-09-13 (×5): qty 1

## 2013-09-13 MED ORDER — MORPHINE SULFATE 2 MG/ML IJ SOLN
2.0000 mg | INTRAMUSCULAR | Status: DC | PRN
  Administered 2013-09-14: 4 mg via INTRAVENOUS
  Filled 2013-09-13: qty 1
  Filled 2013-09-13 (×2): qty 2

## 2013-09-13 MED ORDER — ASPIRIN EC 325 MG PO TBEC
325.0000 mg | DELAYED_RELEASE_TABLET | Freq: Every day | ORAL | Status: DC
  Filled 2013-09-13: qty 1

## 2013-09-13 MED ORDER — PHENYLEPHRINE HCL 10 MG/ML IJ SOLN
0.0000 ug/min | INTRAVENOUS | Status: DC
  Filled 2013-09-13: qty 2

## 2013-09-13 MED ORDER — BISACODYL 10 MG RE SUPP: 10.0000 mg | Freq: Every day | RECTAL | Status: DC

## 2013-09-13 MED ORDER — ACETAMINOPHEN 160 MG/5ML PO SOLN: 650.0000 mg | Freq: Once | ORAL | Status: AC

## 2013-09-13 MED ORDER — ARTIFICIAL TEARS OP OINT
TOPICAL_OINTMENT | OPHTHALMIC | Status: DC | PRN
  Administered 2013-09-13: 1 via OPHTHALMIC

## 2013-09-13 MED ORDER — MAGNESIUM SULFATE 4000MG/100ML IJ SOLN
4.0000 g | Freq: Once | INTRAMUSCULAR | Status: AC
  Administered 2013-09-13: 4 g via INTRAVENOUS
  Filled 2013-09-13: qty 100

## 2013-09-13 MED ORDER — PROTAMINE SULFATE 10 MG/ML IV SOLN
INTRAVENOUS | Status: DC | PRN
  Administered 2013-09-13 (×2): 20 mg via INTRAVENOUS
  Administered 2013-09-13: 30 mg via INTRAVENOUS
  Administered 2013-09-13: 10 mg via INTRAVENOUS
  Administered 2013-09-13 (×2): 20 mg via INTRAVENOUS

## 2013-09-13 MED ORDER — ACETAMINOPHEN 160 MG/5ML PO SOLN: 1000.0000 mg | Freq: Four times a day (QID) | ORAL | Status: DC

## 2013-09-13 MED ORDER — MIDAZOLAM HCL 2 MG/2ML IJ SOLN: 2.0000 mg | INTRAMUSCULAR | Status: DC | PRN

## 2013-09-13 MED ORDER — VANCOMYCIN HCL 1000 MG IV SOLR
INTRAVENOUS | Status: DC | PRN
  Administered 2013-09-13: 11:00:00

## 2013-09-13 MED ORDER — SUCCINYLCHOLINE CHLORIDE 20 MG/ML IJ SOLN
INTRAMUSCULAR | Status: AC
  Filled 2013-09-13: qty 1

## 2013-09-13 MED ORDER — LATANOPROST 0.005 % OP SOLN
1.0000 [drp] | Freq: Every day | OPHTHALMIC | Status: DC
  Administered 2013-09-13 – 2013-09-18 (×6): 1 [drp] via OPHTHALMIC
  Filled 2013-09-13: qty 2.5

## 2013-09-13 MED ORDER — DEXMEDETOMIDINE HCL IN NACL 200 MCG/50ML IV SOLN
0.1000 ug/kg/h | INTRAVENOUS | Status: DC
Start: 2013-09-13 — End: 2013-09-14
  Administered 2013-09-13: 0.7 ug/kg/h via INTRAVENOUS
  Administered 2013-09-13: 0.5 ug/kg/h via INTRAVENOUS
  Filled 2013-09-13 (×2): qty 50

## 2013-09-13 MED ORDER — LACTATED RINGERS IV SOLN: INTRAVENOUS | Status: DC

## 2013-09-13 MED ORDER — ONDANSETRON HCL 4 MG/2ML IJ SOLN
4.0000 mg | Freq: Four times a day (QID) | INTRAMUSCULAR | Status: DC | PRN
  Administered 2013-09-14: 4 mg via INTRAVENOUS
  Filled 2013-09-13: qty 2

## 2013-09-13 MED ORDER — FAMOTIDINE IN NACL 20-0.9 MG/50ML-% IV SOLN
20.0000 mg | Freq: Two times a day (BID) | INTRAVENOUS | Status: AC
  Administered 2013-09-13 (×2): 20 mg via INTRAVENOUS
  Filled 2013-09-13: qty 50

## 2013-09-13 MED ORDER — SODIUM CHLORIDE 0.9 % IV SOLN
INTRAVENOUS | Status: DC | PRN
  Administered 2013-09-13: 13:00:00 via INTRAVENOUS

## 2013-09-13 MED ORDER — ASPIRIN 81 MG PO CHEW: 324.0000 mg | CHEWABLE_TABLET | Freq: Every day | ORAL | Status: DC

## 2013-09-13 MED ORDER — ALBUMIN HUMAN 5 % IV SOLN
250.0000 mL | INTRAVENOUS | Status: DC | PRN
  Administered 2013-09-13 (×2): 250 mL via INTRAVENOUS

## 2013-09-13 MED ORDER — SODIUM CHLORIDE 0.9 % IJ SOLN: 3.0000 mL | INTRAMUSCULAR | Status: DC | PRN

## 2013-09-13 MED ORDER — NITROGLYCERIN IN D5W 200-5 MCG/ML-% IV SOLN: 0.0000 ug/min | INTRAVENOUS | Status: DC

## 2013-09-13 MED ORDER — SODIUM CHLORIDE 0.9 % IR SOLN
Status: DC | PRN
  Administered 2013-09-13: 3000 mL

## 2013-09-13 MED ORDER — PHENYLEPHRINE 40 MCG/ML (10ML) SYRINGE FOR IV PUSH (FOR BLOOD PRESSURE SUPPORT)
PREFILLED_SYRINGE | INTRAVENOUS | Status: AC
  Filled 2013-09-13: qty 10

## 2013-09-13 MED ORDER — METOPROLOL TARTRATE 1 MG/ML IV SOLN
2.5000 mg | INTRAVENOUS | Status: DC | PRN
  Administered 2013-09-14 (×2): 2.5 mg via INTRAVENOUS
  Administered 2013-09-15 (×3): 5 mg via INTRAVENOUS
  Administered 2013-09-18: 2.5 mg via INTRAVENOUS
  Filled 2013-09-13 (×5): qty 5

## 2013-09-13 MED ORDER — PHENYLEPHRINE HCL 10 MG/ML IJ SOLN
10.0000 mg | INTRAVENOUS | Status: DC | PRN
  Administered 2013-09-13: 10 ug/min via INTRAVENOUS

## 2013-09-13 MED ORDER — DEXTROSE 5 % IV SOLN
1.5000 g | Freq: Two times a day (BID) | INTRAVENOUS | Status: DC
  Filled 2013-09-13: qty 1.5

## 2013-09-13 MED ORDER — BISACODYL 5 MG PO TBEC
10.0000 mg | DELAYED_RELEASE_TABLET | Freq: Every day | ORAL | Status: DC
  Administered 2013-09-14 – 2013-09-19 (×4): 10 mg via ORAL
  Filled 2013-09-13 (×5): qty 2

## 2013-09-13 MED ORDER — SODIUM CHLORIDE 0.9 % IV SOLN
INTRAVENOUS | Status: DC
  Administered 2013-09-13: 0.6 [IU]/h via INTRAVENOUS
  Filled 2013-09-13 (×2): qty 1

## 2013-09-13 SURGICAL SUPPLY — 87 items
ADAPTER CARDIO PERF ANTE/RETRO (ADAPTER) ×2 IMPLANT
ADPR PRFSN 84XANTGRD RTRGD (ADAPTER) ×1
ATTRACTOMAT 16X20 MAGNETIC DRP (DRAPES) ×2 IMPLANT
BLADE STERNUM SYSTEM 6 (BLADE) ×2 IMPLANT
BLADE SURG 11 STRL SS (BLADE) ×2 IMPLANT
CANISTER SUCTION 2500CC (MISCELLANEOUS) ×2 IMPLANT
CANNULA GUNDRY RCSP 15FR (MISCELLANEOUS) ×2 IMPLANT
CANNULA SOFTFLOW AORTIC 7M21FR (CANNULA) ×2 IMPLANT
CATH CPB KIT OWEN (MISCELLANEOUS) ×2 IMPLANT
CATH HEART VENT LEFT (CATHETERS) ×1 IMPLANT
CATH THORACIC 36FR RT ANG (CATHETERS) ×1 IMPLANT
CONT SPEC 4OZ CLIKSEAL STRL BL (MISCELLANEOUS) ×1 IMPLANT
COVER SURGICAL LIGHT HANDLE (MISCELLANEOUS) ×2 IMPLANT
CRADLE DONUT ADULT HEAD (MISCELLANEOUS) ×2 IMPLANT
DEVICE PMI PUNCTURE CLOSURE (MISCELLANEOUS) ×1 IMPLANT
DRAIN CHANNEL 32F RND 10.7 FF (WOUND CARE) ×2 IMPLANT
DRAPE BILATERAL SPLIT (DRAPES) IMPLANT
DRAPE CARDIOVASCULAR INCISE (DRAPES) ×2
DRAPE CV SPLIT W-CLR ANES SCRN (DRAPES) IMPLANT
DRAPE INCISE IOBAN 66X45 STRL (DRAPES) ×2 IMPLANT
DRAPE SLUSH/WARMER DISC (DRAPES) ×2 IMPLANT
DRAPE SRG 135X102X78XABS (DRAPES) IMPLANT
DRSG COVADERM 4X14 (GAUZE/BANDAGES/DRESSINGS) ×2 IMPLANT
ELECT REM PT RETURN 9FT ADLT (ELECTROSURGICAL) ×4
ELECTRODE REM PT RTRN 9FT ADLT (ELECTROSURGICAL) ×2 IMPLANT
GLOVE BIO SURGEON STRL SZ 6 (GLOVE) IMPLANT
GLOVE BIO SURGEON STRL SZ 6.5 (GLOVE) IMPLANT
GLOVE BIO SURGEON STRL SZ7 (GLOVE) IMPLANT
GLOVE BIO SURGEON STRL SZ7.5 (GLOVE) ×1 IMPLANT
GLOVE BIO SURGEON STRL SZ8.5 (GLOVE) ×1 IMPLANT
GLOVE BIOGEL PI IND STRL 6.5 (GLOVE) IMPLANT
GLOVE BIOGEL PI INDICATOR 6.5 (GLOVE) ×6
GLOVE ORTHO TXT STRL SZ7.5 (GLOVE) ×6 IMPLANT
GOWN STRL REUS W/ TWL LRG LVL3 (GOWN DISPOSABLE) ×4 IMPLANT
GOWN STRL REUS W/TWL LRG LVL3 (GOWN DISPOSABLE) ×8
HEMOSTAT POWDER SURGIFOAM 1G (HEMOSTASIS) ×7 IMPLANT
INSERT FOGARTY XLG (MISCELLANEOUS) ×2 IMPLANT
KIT BASIN OR (CUSTOM PROCEDURE TRAY) ×2 IMPLANT
KIT DRAINAGE VACCUM ASSIST (KITS) ×1 IMPLANT
KIT ROOM TURNOVER OR (KITS) ×2 IMPLANT
KIT SUCTION CATH 14FR (SUCTIONS) ×7 IMPLANT
LEAD PACING MYOCARDI (MISCELLANEOUS) ×2 IMPLANT
LINE VENT (MISCELLANEOUS) ×1 IMPLANT
NS IRRIG 1000ML POUR BTL (IV SOLUTION) ×10 IMPLANT
PACK OPEN HEART (CUSTOM PROCEDURE TRAY) ×2 IMPLANT
PAD ARMBOARD 7.5X6 YLW CONV (MISCELLANEOUS) ×4 IMPLANT
SET CARDIOPLEGIA MPS 5001102 (MISCELLANEOUS) ×1 IMPLANT
SET IRRIG TUBING LAPAROSCOPIC (IRRIGATION / IRRIGATOR) ×2 IMPLANT
SOLUTION ANTI FOG 6CC (MISCELLANEOUS) ×1 IMPLANT
SPONGE GAUZE 4X4 12PLY (GAUZE/BANDAGES/DRESSINGS) ×4 IMPLANT
SPONGE GAUZE 4X4 12PLY STER LF (GAUZE/BANDAGES/DRESSINGS) ×1 IMPLANT
SUT BONE WAX W31G (SUTURE) ×1 IMPLANT
SUT ETHIBON 2 0 V 52N 30 (SUTURE) ×5 IMPLANT
SUT ETHIBON EXCEL 2-0 V-5 (SUTURE) IMPLANT
SUT ETHIBOND 2 0 SH (SUTURE)
SUT ETHIBOND 2 0 SH 36X2 (SUTURE) IMPLANT
SUT ETHIBOND 2 0 V4 (SUTURE) IMPLANT
SUT ETHIBOND 2 0V4 GREEN (SUTURE) IMPLANT
SUT ETHIBOND 3 0 SH 1 (SUTURE) ×1 IMPLANT
SUT ETHIBOND 4 0 RB 1 (SUTURE) IMPLANT
SUT ETHIBOND V-5 VALVE (SUTURE) IMPLANT
SUT ETHIBOND X763 2 0 SH 1 (SUTURE) ×7 IMPLANT
SUT MNCRL AB 3-0 PS2 18 (SUTURE) ×6 IMPLANT
SUT PDS AB 1 CTX 36 (SUTURE) ×4 IMPLANT
SUT PROLENE 4 0 RB 1 (SUTURE) ×14
SUT PROLENE 4 0 SH DA (SUTURE) ×2 IMPLANT
SUT PROLENE 4-0 RB1 .5 CRCL 36 (SUTURE) ×2 IMPLANT
SUT PROLENE 5 0 C 1 36 (SUTURE) IMPLANT
SUT PROLENE 6 0 C 1 30 (SUTURE) ×1 IMPLANT
SUT SILK  1 MH (SUTURE) ×1
SUT SILK 1 MH (SUTURE) ×1 IMPLANT
SUT SILK 2 0 SH CR/8 (SUTURE) IMPLANT
SUT SILK 3 0 SH CR/8 (SUTURE) IMPLANT
SUT STEEL 6MS V (SUTURE) IMPLANT
SUT STEEL SZ 6 DBL 3X14 BALL (SUTURE) ×3 IMPLANT
SUT VIC AB 2-0 CTX 27 (SUTURE) IMPLANT
SUT VIC AB 2-0 UR6 27 (SUTURE) ×1 IMPLANT
SUTURE E-PAK OPEN HEART (SUTURE) ×2 IMPLANT
SYSTEM SAHARA CHEST DRAIN ATS (WOUND CARE) ×2 IMPLANT
TOWEL OR 17X24 6PK STRL BLUE (TOWEL DISPOSABLE) ×4 IMPLANT
TOWEL OR 17X26 10 PK STRL BLUE (TOWEL DISPOSABLE) ×4 IMPLANT
TRAY FOLEY IC TEMP SENS 14FR (CATHETERS) ×1 IMPLANT
TRAY FOLEY IC TEMP SENS 16FR (CATHETERS) ×2 IMPLANT
UNDERPAD 30X30 INCONTINENT (UNDERPADS AND DIAPERS) ×2 IMPLANT
VALVE MAGNA EASE AORTIC 25MM (Prosthesis & Implant Heart) ×1 IMPLANT
VENT LEFT HEART 12002 (CATHETERS) ×2
WATER STERILE IRR 1000ML POUR (IV SOLUTION) ×4 IMPLANT

## 2013-09-13 NOTE — Progress Notes (Signed)
Dr. Roxy Manns called to check on patient, updated that the past two hours of CT output were 70 and 80. No new orders given at this time.

## 2013-09-13 NOTE — Progress Notes (Signed)
Dr. Roxy Manns notified of patient's increasing amounts of bleeding from CTs putting out 150 mls in an hour. Orders for 4 units of FFP, 2 units of platelets, 2 units of Cryo, and another unit of PRBCs were given. 2 units of PRBCs were previously given per order. Patient is stable at this time with 10 mcg of neo to support BP.

## 2013-09-13 NOTE — Brief Op Note (Addendum)
      [address]       [place] [address]             [phone]     09/13/2013  12:16 PM  PATIENT:  [name]  80 y.o. male  PRE-OPERATIVE DIAGNOSIS:  SEVERE AS  POST-OPERATIVE DIAGNOSIS:  SEVERE AS  PROCEDURE:  Procedure(s): AORTIC VALVE REPLACEMENT (AVR)#25 MAGNA EASE PERICARDIAL BIOPROSTHETIC INTRAOPERATIVE TRANSESOPHAGEAL ECHOCARDIOGRAM  SURGEON:    Rexene Alberts, MD  ASSISTANTS:  Kooper Giovanni, PA-C  ANESTHESIA:   Roberts Gaudy, MD  CROSSCLAMP TIME:   19'  CARDIOPULMONARY BYPASS TIME: 117'  FINDINGS:  Bicuspid native aortic valve with severe aortic stenosis  Normal LV systolic function  Mild LV hypertrophy  Mild mitral regurgitation   Aortic Valve  Procedure Performed:  Replacement: Yes.  Bioprosthetic Valve. Implant Model Number:[id number], Size:25, Unique Device Identifier:[id number]  Repair/Reconstruction: No.   Aortic Annular Enlargement: No.  Aortic Valve Etiology   Aortic Insufficiency:  Trivial/Trace  Aortic Valve Disease:  Yes.  Aortic Stenosis:  Yes. Smallest Aortic Valve Area: 0.69cm2; Highest Mean Gradient: 61mmHg.  Etiology (Choose at least one and up to  5 etiologies):  Bicuspid valve disease and Degenerative - Calcified   COMPLICATIONS: None  BASELINE WEIGHT: 63 kg  PATIENT DISPOSITION:   TO SICU IN STABLE CONDITION  [name] 09/13/2013 1:25 PM

## 2013-09-13 NOTE — OR Nursing (Signed)
2nd call to SICU 1318. 

## 2013-09-13 NOTE — CV Procedure (Signed)
Intra-operative Transesophageal Echocardiography Report:  [name] is an 80 year old male with a history of severe aortic stenosis. He developed severe shortness of breath and hemoptysis associated with an episode of pneumonia in January. Echocardiography at  that time revealed a mean trans-aortic gradient of 56 mm mercury. He is now scheduled undergo elective aortic valve replacement by Dr. Roxy Manns. Intra-operative transesophageal echocardiography was indicated to evaluate the aortic valve and to assist with the procedure, to assess for any other valvular pathology, to assess the right and left ventricular function, and to serve as a monitor for intraoperative volume status.  The patient was brought to the operating room at [facility] and general anesthesia was induced without difficulty. Following endotracheal intubation and orogastric suctioning, the transesophageal echocardiography probe was inserted into the esophagus without difficulty.   Impression: Pre-bypass findings:  1. Aortic valve: The aortic valve was heavily calcified. There was severe restriction to opening. There was 2+ aortic insufficiency noted. Due to an inability to advance the transesophageal echocardiography probe into the stomach, I was unable to do any continuous-wave Doppler interrogation of the aortic outflow. The aortic annulus measured 2.4 cm in diameter.  2. Ascending aorta: There was a well-defined aortic root and sinotubular ridge. There was moderate thickening of the intima with some mild calcification noted. There were no protruding atheromata noted. The aortic root  at the sinuses of Valsalva measured 3.31 cm. The sinotubular ridge measured 2.89 cm and the proximal aortic descending aorta measured 3.3 cm  3. Mitral valve: There was severe mitral annular calcification. This was most evident at the base of the anterior leaflet. The leaflets however opened adequately. There was trace mitral insufficiency. There  were no prolapse or flail segments noted. 3. Left ventricle: I was unable to assess the left ventricle from the transgastric view. However using the mid-esophageal views there appeared to be no obvious regional wall motion abnormalities and normal appearing left ventricular systolic function.  5. Right ventricle: Right ventricular cavity appeared to be within normal limits of size. There was normal contractility of the right ventricular free wall normal right ventricular function.  6. Interatrial septum: The interatrial septum was intact without evidence of patent foramen ovale or atrial septal defect by color Doppler or bubble study.   7. Left atrium: There was no thrombus noted within the left atrial cavity or left atrial appendage.  8. Descending aorta: The descending aorta was of normal diameter and measured 2.46 cm. There was mild atheromatous disease scattered within the walls of the descending aorta.   Post-bypass findings:  1. Aortic valve: There was a bioprosthetic valve in aortic position. The leaflets open normally without restriction. There was no aortic insufficiency noted  2.  Mitral valve: The mitral valve appeared unchanged from the pre-bypass study. There was severe mitral annular calcification and trace mitral insufficiency  3. Left ventricle: There appeared to be normal left ventricular systolic function although the exam was somewhat limited due to the lack of adequate transgastric views..   4. Right ventricle: There was normal contractility of the RV free wall and normal appearing RV function.   5.Tricuspid valve: There was trace tricuspid insufficiency normal appearing tricuspid leaflets.  Roberts Gaudy, MD

## 2013-09-13 NOTE — Progress Notes (Signed)
Echocardiogram Echocardiogram Transesophageal has been performed.  Joelene Millin 09/13/2013, 9:28 AM

## 2013-09-13 NOTE — Anesthesia Preprocedure Evaluation (Addendum)
Anesthesia Evaluation  Patient identified by MRN, date of birth, ID band Patient awake    Reviewed: Allergy & Precautions, H&P , NPO status , Patient's Chart, lab work & pertinent test results, reviewed documented beta blocker date and time   Airway Mallampati: II TM Distance: >3 FB Neck ROM: Full    Dental  (+) Teeth Intact, Poor Dentition, Dental Advisory Given   Pulmonary shortness of breath and with exertion, pneumonia -, resolved,  breath sounds clear to auscultation        Cardiovascular hypertension, Pt. on medications and Pt. on home beta blockers + CAD and + Past MI Rhythm:Irregular Rate:Normal + Systolic murmurs    Neuro/Psych    GI/Hepatic negative GI ROS, Neg liver ROS,   Endo/Other  negative endocrine ROS  Renal/GU negative Renal ROS     Musculoskeletal negative musculoskeletal ROS (+)   Abdominal (+)  Abdomen: soft. Bowel sounds: normal.  Peds  Hematology negative hematology ROS (+)   Anesthesia Other Findings   Reproductive/Obstetrics negative OB ROS                         Anesthesia Physical Anesthesia Plan  ASA: III  Anesthesia Plan: General   Post-op Pain Management:    Induction: Intravenous  Airway Management Planned: Oral ETT  Additional Equipment: 3D TEE, Ultrasound Guidance Line Placement, Arterial line, PA Cath and CVP  Intra-op Plan:   Post-operative Plan: Post-operative intubation/ventilation  Informed Consent: I have reviewed the patients History and Physical, chart, labs and discussed the procedure including the risks, benefits and alternatives for the proposed anesthesia with the patient or authorized representative who has indicated his/her understanding and acceptance.   Dental advisory given  Plan Discussed with: CRNA and Anesthesiologist  Anesthesia Plan Comments: (Severe aortic stenosis Hypertension Recent H/O pneumonia S/P R. Carotid  endaterectomy   Plan GA)        Anesthesia Quick Evaluation

## 2013-09-13 NOTE — Progress Notes (Signed)
CRNA attempted OG tube but was unsuccessful, meeting resistance with attempts. Swelling and sanguinous oral secretions noted.

## 2013-09-13 NOTE — Progress Notes (Signed)
CT surgery p.m. Rounds  Status post aVR for aortic stenosis Patient developed intraoperative coagulopathy and received 2 units of packed cells Currently receiving blood products including platelets FFP and cryo- for persistent chest tube drainage Hemodynamically stable Postop chest x-ray reviewed demonstrated baseline elevated left hemidiaphragm and atelectasis

## 2013-09-13 NOTE — Anesthesia Procedure Notes (Signed)
Procedure Name: Intubation Date/Time: 09/13/2013 8:43 AM Performed by: Maeola Harman Pre-anesthesia Checklist: Patient identified, Emergency Drugs available, Suction available, Patient being monitored and Timeout performed Patient Re-evaluated:Patient Re-evaluated prior to inductionOxygen Delivery Method: Circle system utilized Preoxygenation: Pre-oxygenation with 100% oxygen Intubation Type: IV induction Ventilation: Mask ventilation without difficulty Laryngoscope Size: Mac and 3 Grade View: Grade I Tube size: 7.5 mm Number of attempts: 1 Airway Equipment and Method: Stylet Placement Confirmation: ETT inserted through vocal cords under direct vision,  positive ETCO2 and breath sounds checked- equal and bilateral Secured at: 22 cm Tube secured with: Tape Dental Injury: Teeth and Oropharynx as per pre-operative assessment and Injury to lip  Comments: Easy induction and intubation with MAC 3 blade. Dr. Linna Caprice verified placement of ETT.  Gregory Session, CRNA

## 2013-09-13 NOTE — Op Note (Signed)
CARDIOTHORACIC SURGERY OPERATIVE NOTE  Date of Procedure:  09/13/2013  Preoperative Diagnosis: Severe Aortic Stenosis   Postoperative Diagnosis: Same   Procedure:    Aortic Valve Replacement  Edwards Magna Ease Pericardial Tissue Valve (size 25 mm, model # 3300TFX, serial # [id number])   Surgeon: Valentina Gu. 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 The patient has remained fairly active and healthy for all of his life. He has a long-standing history of hypertension in several years ago was found to have a heart murmur on routine physical exam. He was diagnosed with aortic stenosis and has been followed using serial echocardiograms. He was hospitalized in January following a particular severe case of the flu which progressed into pneumonia. He states that he first developed flulike symptoms at the end of December. He suffered from a lingering productive cough for several weeks. Ultimately he developed somewhat of acute onset of shortness of breath with worsening productive cough and hemoptysis. He was hospitalized and treated with intravenous antibiotics. CT scan of the chest revealed findings consistent with pneumonia and notable for the absence of pulmonary embolus. Followup echocardiogram performed at that time demonstrated severe aortic stenosis with preserved left ventricular function. Left and right heart catheterization was performed and notable for the absence of significant coronary artery disease. Mean transvalvular gradient measured across  the aortic valve it catheterization was 56 mm mercury. The patient improved clinically and was seen in followup recently by Dr. Stanford Breed. Now that his pneumonia has completely resolved the patient was referred for possible elective aortic valve replacement. The patient has been seen in consultation and counseled at length regarding the indications, risks and potential benefits of surgery.  All questions have been answered, and the patient provides full informed consent for the operation as described.     DETAILS OF THE OPERATIVE PROCEDURE  Preparation:  The patient is brought to the operating room on the above mentioned date and central monitoring was established by the anesthesia team including placement of Swan-Ganz catheter and radial arterial line. The patient is placed in the supine position on the operating table.  Intravenous antibiotics are administered. General endotracheal anesthesia is induced uneventfully. A Foley catheter is placed.  Baseline transesophageal echocardiogram was performed.  Findings were notable for bicuspid native aortic valve with fusion of the raphe between the left and right cusps.  There was severe aortic stenosis and mild aortic insufficiency.  There was mild LVH and normal LV systolic function.  There was mild mitral regurgitation.  The patient's chest, abdomen, both groins, and both lower extremities are prepared and draped in a sterile manner. A time out procedure is performed.   Surgical Approach:  A median sternotomy incision was performed and the pericardium is opened. The ascending aorta is normal in appearance.    Extracorporeal Cardiopulmonary Bypass and Myocardial Protection:  The ascending aorta and the right atrium are cannulated for cardiopulmonary bypass.  Adequate heparinization is verified.   A retrograde cardioplegia cannula is placed through the right atrium into the coronary sinus.  The operative field was continuously flooded  with carbon  dioxide gas.  The entire pre-bypass portion of the operation was notable for stable hemodynamics.  Cardiopulmonary bypass was begun and the surface of the heart is inspected.  A cardioplegia cannula is placed in the ascending aorta.  A temperature probe was placed in the interventricular septum.  The patient is cooled to 32C systemic temperature.  The aortic cross clamp is applied and cold blood cardioplegia is delivered initially in an antegrade fashion through the aortic root.   Supplemental cardioplegia is given retrograde through the coronary sinus catheter.  Iced saline slush is applied for topical hypothermia.  The initial cardioplegic arrest is rapid with early diastolic arrest.  Repeat doses of cardioplegia are administered intermittently throughout the entire cross clamp portion of the operation through the coronary sinus catheter in order to maintain completely flat electrocardiogram and septal myocardial temperature below 15C.  Myocardial protection was felt to be excellent.   Aortic Valve Replacement:  An oblique transverse aortotomy incision was performed.  The aortic valve was inspected and notable for bicuspid aortic valve with severe aortic stenosis.  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  The aortic valve leaflets were excised sharply and the aortic annulus decalcified.  Decalcification was notably tedious due to extensive calcification.  The aortic annulus was sized to accept a 25 mm prosthesis.  The aortic root and left ventricle were irrigated with copious cold saline solution.  Aortic valve replacement was performed using interrupted horizontal mattress 2-0 Ethibond pledgeted sutures with pledgets in the subannular position.  An [facility] Ease pericardial tissue valve (size 25 mm, model # 3300TFX, serial # [id number]) was implanted uneventfully. The valve seated appropriately with adequate space beneath the left main and right coronary  artery.   Procedure Completion:  The aortotomy was closed using a 2-layer closure of running 4-0 Prolene suture with teflon felt strips to buttress the suture line.  One final dose of warm retrograde "hot shot" cardioplegia was administered retrograde through the coronary sinus catheter while all air was evacuated through the aortic root.  The aortic cross clamp was removed after a total cross clamp time of 91 minutes.  Epicardial pacing wires are fixed to the right ventricular outflow tract and to the right atrial appendage. The patient is rewarmed to 37C temperature. The aortic and left ventricular vents are removed.  The patient is weaned and disconnected from cardiopulmonary bypass.  The patient's rhythm at separation from bypass was sinus.  The patient was weaned from cardioplegic bypass without any inotropic support. Total cardiopulmonary bypass time for the operation was 117 minutes.  Followup transesophageal echocardiogram performed after separation from bypass revealed a well-seated aortic valve prosthesis that was functioning normally and without any sign of perivalvular leak.  Left ventricular function was unchanged from preoperatively.  The aortic and venous cannula were removed uneventfully. Protamine was administered to reverse the anticoagulation. The mediastinum and pleural space were inspected for hemostasis and irrigated with saline solution. The mediastinum was drained using 2 chest tubes placed through separate stab incisions inferiorly.  The soft tissues anterior to the aorta were reapproximated loosely. The sternum is closed with double strength sternal wire. The soft tissues anterior to the sternum were closed in multiple layers and the skin is closed with a running subcuticular skin closure.  The post-bypass portion of the operation was notable for stable rhythm and hemodynamics.  The patient received a total of 2 packs adult platelets due to coagulopathy and thrombocytopenia  after  separation from cardiopulmonary bypass and reversal of heparin with protamine.    Disposition:  The patient tolerated the procedure well and is transported to the surgical intensive care in stable condition. There are no intraoperative complications. All sponge instrument and needle counts are verified correct at completion of the operation.    Valentina Gu. Roxy Manns MD 09/13/2013 1:28 PM

## 2013-09-13 NOTE — Progress Notes (Signed)
Report given to [facility], Therapist, sports. Dr. Roxy Manns called to check on pt and requested notification if patient's CT output increases to 100 ml/hr or more. Samantha notified of this.

## 2013-09-13 NOTE — Transfer of Care (Signed)
Immediate Anesthesia Transfer of Care Note  Patient: [name]  Procedure(s) Performed: Procedure(s): AORTIC VALVE REPLACEMENT (AVR) (N/A) INTRAOPERATIVE TRANSESOPHAGEAL ECHOCARDIOGRAM (N/A)  Patient Location: SICU  Anesthesia Type:General  Level of Consciousness: sedated, unresponsive and Patient remains intubated per anesthesia plan  Airway & Oxygen Therapy: Patient remains intubated per anesthesia plan and Patient placed on Ventilator (see vital sign flow sheet for setting)  Post-op Assessment: Report given to PACU RN and Post -op Vital signs reviewed and stable  Post vital signs: Reviewed and stable  Complications: No apparent anesthesia complications

## 2013-09-13 NOTE — OR Nursing (Signed)
1st call to SICU at 1246.

## 2013-09-13 NOTE — Interval H&P Note (Signed)
History and Physical Interval Note:  09/13/2013 7:23 AM  [name]  has presented today for surgery, with the diagnosis of SEVERE AS  The various methods of treatment have been discussed with the patient and family. After consideration of risks, benefits and other options for treatment, the patient has consented to  Procedure(s): AORTIC VALVE REPLACEMENT (AVR) (N/A) INTRAOPERATIVE TRANSESOPHAGEAL ECHOCARDIOGRAM (N/A) as a surgical intervention .  The patient's history has been reviewed, patient examined, no change in status, stable for surgery.  I have reviewed the patient's chart and labs.  Questions were answered to the patient's satisfaction.     Gurtej Noyola H

## 2013-09-13 NOTE — Progress Notes (Signed)
Dr. Roxy Manns aware of CT output of 140 on arrival to the unit. Hgb 7.8- orders given to transfuse 2 units of PRBCs. Will continue to monitor.

## 2013-09-14 ENCOUNTER — Inpatient Hospital Stay (HOSPITAL_COMMUNITY): Payer: Medicare Other

## 2013-09-14 LAB — PREPARE PLATELET PHERESIS
UNIT DIVISION: 0
Unit division: 0
Unit division: 0
Unit division: 0

## 2013-09-14 LAB — POCT I-STAT 3, ART BLOOD GAS (G3+)
ACID-BASE DEFICIT: 3 mmol/L — AB (ref 0.0–2.0)
Acid-base deficit: 4 mmol/L — ABNORMAL HIGH (ref 0.0–2.0)
Bicarbonate: 21.5 mEq/L (ref 20.0–24.0)
Bicarbonate: 21.7 mEq/L (ref 20.0–24.0)
O2 Saturation: 98 %
O2 Saturation: 99 %
PH ART: 7.333 — AB (ref 7.350–7.450)
PH ART: 7.343 — AB (ref 7.350–7.450)
Patient temperature: 38.4
Patient temperature: 38.1
TCO2: 23 mmol/L (ref 0–100)
TCO2: 23 mmol/L (ref 0–100)
pCO2 arterial: 41.2 mmHg (ref 35.0–45.0)
pCO2 arterial: 40.4 mmHg (ref 35.0–45.0)
pO2, Arterial: 114 mmHg — ABNORMAL HIGH (ref 80.0–100.0)
pO2, Arterial: 138 mmHg — ABNORMAL HIGH (ref 80.0–100.0)

## 2013-09-14 LAB — CBC
HCT: 29 % — ABNORMAL LOW (ref 39.0–52.0)
HCT: 34 % — ABNORMAL LOW (ref 39.0–52.0)
HEMOGLOBIN: 10.2 g/dL — AB (ref 13.0–17.0)
Hemoglobin: 11.8 g/dL — ABNORMAL LOW (ref 13.0–17.0)
MCH: 31.8 pg (ref 26.0–34.0)
MCH: 31.9 pg (ref 26.0–34.0)
MCHC: 35.2 g/dL (ref 30.0–36.0)
MCHC: 34.7 g/dL (ref 30.0–36.0)
MCV: 90.3 fL (ref 78.0–100.0)
MCV: 91.9 fL (ref 78.0–100.0)
PLATELETS: 167 10*3/uL (ref 150–400)
PLATELETS: 173 10*3/uL (ref 150–400)
RBC: 3.21 MIL/uL — AB (ref 4.22–5.81)
RBC: 3.7 MIL/uL — AB (ref 4.22–5.81)
RDW: 15.7 % — ABNORMAL HIGH (ref 11.5–15.5)
RDW: 16 % — ABNORMAL HIGH (ref 11.5–15.5)
WBC: 5 10*3/uL (ref 4.0–10.5)
WBC: 8.2 10*3/uL (ref 4.0–10.5)

## 2013-09-14 LAB — GLUCOSE, CAPILLARY
GLUCOSE-CAPILLARY: 110 mg/dL — AB (ref 70–99)
GLUCOSE-CAPILLARY: 114 mg/dL — AB (ref 70–99)
GLUCOSE-CAPILLARY: 115 mg/dL — AB (ref 70–99)
GLUCOSE-CAPILLARY: 117 mg/dL — AB (ref 70–99)
GLUCOSE-CAPILLARY: 121 mg/dL — AB (ref 70–99)
GLUCOSE-CAPILLARY: 66 mg/dL — AB (ref 70–99)
Glucose-Capillary: 117 mg/dL — ABNORMAL HIGH (ref 70–99)
Glucose-Capillary: 123 mg/dL — ABNORMAL HIGH (ref 70–99)
Glucose-Capillary: 123 mg/dL — ABNORMAL HIGH (ref 70–99)
Glucose-Capillary: 88 mg/dL (ref 70–99)
Glucose-Capillary: 93 mg/dL (ref 70–99)
Glucose-Capillary: 157 mg/dL — ABNORMAL HIGH (ref 70–99)
Glucose-Capillary: 129 mg/dL — ABNORMAL HIGH (ref 70–99)

## 2013-09-14 LAB — BASIC METABOLIC PANEL
BUN: 14 mg/dL (ref 6–23)
CHLORIDE: 107 meq/L (ref 96–112)
CO2: 21 mEq/L (ref 19–32)
Calcium: 8.2 mg/dL — ABNORMAL LOW (ref 8.4–10.5)
Creatinine, Ser: 0.82 mg/dL (ref 0.50–1.35)
GFR calc Af Amer: >90 mL/min (ref >90)
GFR calc non Af Amer: 81 mL/min — ABNORMAL LOW (ref >90)
Glucose, Bld: 120 mg/dL — ABNORMAL HIGH (ref 70–99)
Potassium: 3.7 mEq/L (ref 3.7–5.3)
SODIUM: 141 meq/L (ref 137–147)

## 2013-09-14 LAB — PREPARE FRESH FROZEN PLASMA
UNIT DIVISION: 0
UNIT DIVISION: 0
Unit division: 0
Unit division: 0

## 2013-09-14 LAB — CREATININE, SERUM
CREATININE: 0.84 mg/dL (ref 0.50–1.35)
GFR calc Af Amer: >90 mL/min (ref >90)
GFR, EST NON AFRICAN AMERICAN: 80 mL/min — AB (ref >90)

## 2013-09-14 LAB — POCT I-STAT, CHEM 8
BUN: 12 mg/dL (ref 6–23)
CREATININE: 0.9 mg/dL (ref 0.50–1.35)
Calcium, Ion: 1.19 mmol/L (ref 1.13–1.30)
Chloride: 103 mEq/L (ref 96–112)
Glucose, Bld: 166 mg/dL — ABNORMAL HIGH (ref 70–99)
HEMATOCRIT: 36 % — AB (ref 39.0–52.0)
HEMOGLOBIN: 12.2 g/dL — AB (ref 13.0–17.0)
POTASSIUM: 3.9 meq/L (ref 3.7–5.3)
SODIUM: 142 meq/L (ref 137–147)
TCO2: 22 mmol/L (ref 0–100)

## 2013-09-14 LAB — PREPARE CRYOPRECIPITATE
UNIT DIVISION: 0
Unit division: 0

## 2013-09-14 LAB — BLOOD PRODUCT ORDER (VERBAL) VERIFICATION

## 2013-09-14 LAB — MAGNESIUM
MAGNESIUM: 2.2 mg/dL (ref 1.5–2.5)
Magnesium: 2 mg/dL (ref 1.5–2.5)

## 2013-09-14 MED ORDER — MORPHINE SULFATE 2 MG/ML IJ SOLN
1.0000 mg | INTRAMUSCULAR | Status: DC | PRN
  Administered 2013-09-14 – 2013-09-15 (×2): 1 mg via INTRAVENOUS
  Filled 2013-09-14 (×2): qty 1

## 2013-09-14 MED ORDER — ASPIRIN EC 325 MG PO TBEC: 325.0000 mg | DELAYED_RELEASE_TABLET | Freq: Every day | ORAL | Status: DC

## 2013-09-14 MED ORDER — INSULIN ASPART 100 UNIT/ML ~~LOC~~ SOLN
0.0000 [IU] | SUBCUTANEOUS | Status: DC
  Administered 2013-09-14 – 2013-09-15 (×3): 2 [IU] via SUBCUTANEOUS

## 2013-09-14 MED ORDER — SODIUM CHLORIDE 0.9 % IV SOLN
INTRAVENOUS | Status: DC
  Administered 2013-09-14 – 2013-09-16 (×2): via INTRAVENOUS

## 2013-09-14 MED ORDER — FUROSEMIDE 10 MG/ML IJ SOLN
20.0000 mg | Freq: Four times a day (QID) | INTRAMUSCULAR | Status: AC
  Administered 2013-09-14 (×3): 20 mg via INTRAVENOUS
  Filled 2013-09-14 (×3): qty 2

## 2013-09-14 MED ORDER — ACETAMINOPHEN 650 MG RE SUPP
650.0000 mg | Freq: Once | RECTAL | Status: AC
  Administered 2013-09-14: 650 mg via RECTAL
  Filled 2013-09-14: qty 1

## 2013-09-14 MED ORDER — ASPIRIN EC 325 MG PO TBEC
325.0000 mg | DELAYED_RELEASE_TABLET | Freq: Every day | ORAL | Status: DC
  Administered 2013-09-14: 325 mg via ORAL
  Filled 2013-09-14 (×2): qty 1

## 2013-09-14 MED ORDER — POTASSIUM CHLORIDE 10 MEQ/50ML IV SOLN
10.0000 meq | INTRAVENOUS | Status: AC
  Administered 2013-09-14 (×3): 10 meq via INTRAVENOUS

## 2013-09-14 MED ORDER — INSULIN ASPART 100 UNIT/ML ~~LOC~~ SOLN: 0.0000 [IU] | SUBCUTANEOUS | Status: DC

## 2013-09-14 MED ORDER — RAMIPRIL 5 MG PO CAPS
5.0000 mg | ORAL_CAPSULE | Freq: Every day | ORAL | Status: DC
  Administered 2013-09-14: 5 mg via ORAL
  Filled 2013-09-14 (×2): qty 1

## 2013-09-14 MED FILL — Sodium Chloride IV Soln 0.9%: INTRAVENOUS | Qty: 1000 | Status: AC

## 2013-09-14 MED FILL — Heparin Sodium (Porcine) Inj 1000 Unit/ML: INTRAMUSCULAR | Qty: 10 | Status: AC

## 2013-09-14 MED FILL — Electrolyte-R (PH 7.4) Solution: INTRAVENOUS | Qty: 3000 | Status: AC

## 2013-09-14 MED FILL — Mannitol IV Soln 20%: INTRAVENOUS | Qty: 500 | Status: AC

## 2013-09-14 MED FILL — Sodium Bicarbonate IV Soln 8.4%: INTRAVENOUS | Qty: 50 | Status: AC

## 2013-09-14 MED FILL — Potassium Chloride Inj 2 mEq/ML: INTRAVENOUS | Qty: 40 | Status: AC

## 2013-09-14 MED FILL — Lidocaine HCl IV Inj 20 MG/ML: INTRAVENOUS | Qty: 5 | Status: AC

## 2013-09-14 MED FILL — Albumin, Human Inj 5%: INTRAVENOUS | Qty: 250 | Status: AC

## 2013-09-14 MED FILL — Magnesium Sulfate Inj 50%: INTRAMUSCULAR | Qty: 10 | Status: AC

## 2013-09-14 MED FILL — Heparin Sodium (Porcine) Inj 1000 Unit/ML: INTRAMUSCULAR | Qty: 30 | Status: AC

## 2013-09-14 NOTE — Progress Notes (Signed)
      [address]       [place] [address]             [phone]        CARDIOTHORACIC SURGERY PROGRESS NOTE   R1 Day Post-Op Procedure(s) (LRB): AORTIC VALVE REPLACEMENT (AVR) (N/A) INTRAOPERATIVE TRANSESOPHAGEAL ECHOCARDIOGRAM (N/A)  Subjective: Doing very well.  Denies having any pain, SOB.  Reports feeling remarkably well.  Objective: Vital signs: BP Readings from Last 1 Encounters:  09/14/13 109/53   Pulse Readings from Last 1 Encounters:  09/14/13 68   Resp Readings from Last 1 Encounters:  09/14/13 12   Temp Readings from Last 1 Encounters:  09/14/13 99.9 F (37.7 C)     Hemodynamics: PAP: (19-55)/(8-25) 41/11 mmHg CO:  [3.7 L/min-4.8 L/min] 4.8 L/min CI:  [2 L/min/m2-2.6 L/min/m2] 2.6 L/min/m2  Physical Exam:  Rhythm:   sinus  Breath sounds: clear  Heart sounds:  RRR  Incisions:  Dressing dry, intact  Abdomen:  Soft, non-distended, non-tender  Extremities:  Warm, well-perfused    Intake/Output from previous day: 03/18 0701 - 03/19 0700 In: 10411.9 [I.V.:5099.9; Blood:3812; IV Piggyback:1500] Out: 9833 [Urine:3060; Blood:1635; Chest Tube:750] Intake/Output this shift:    Lab Results:  CBC: Recent Labs  09/13/13 2046 09/14/13 0355  WBC 4.0 5.0  HGB 9.2* 10.2*  HCT 26.2* 29.0*  PLT 157 167    BMET:  Recent Labs  09/11/13 1455  09/13/13 2044 09/14/13 0355  NA 140  < > 143 141  K 4.4  < > 4.1 3.7  CL 104  --  107 107  CO2 20  --   --  21  GLUCOSE 107*  < > 149* 120*  BUN 23  --  13 14  CREATININE 0.80  < > 0.80 0.82  CALCIUM 9.4  --   --  8.2*  < > = values in this interval not displayed.   CBG (last 3)   Recent Labs  09/13/13 2100 09/13/13 2159 09/13/13 2301  GLUCAP 118* 160* 169*    ABG    Component Value Date/Time   PHART 7.343* 09/14/2013 0618   PCO2ART 40.4 09/14/2013 0618   PO2ART 138.0* 09/14/2013 0618   HCO3 21.7 09/14/2013 0618   TCO2 23 09/14/2013 0618   ACIDBASEDEF 3.0* 09/14/2013 0618   O2SAT 99.0 09/14/2013 0618    CXR: Stable chronic elevation left hemidiaphragm, otherwise looks good  Assessment/Plan: S/P Procedure(s) (LRB): AORTIC VALVE REPLACEMENT (AVR) (N/A) INTRAOPERATIVE TRANSESOPHAGEAL ECHOCARDIOGRAM (N/A)  Overall doing very well POD1 Maintaining NSR w/ stable hemodynamics, no drips Expected post op acute blood loss anemia, mild, stable Expected post op volume excess Expected post op atelectasis, mild Chronic elevation left hemidiaphragm w/ large hiatal hernia Chronic thrombocytopenia, platelet count stable   Mobilize  Diuresis  Pulm toilet  D/C lines, possibly d/c tubes later today  Routine early postop   [name],CLARENCE H 09/14/2013 7:44 AM

## 2013-09-14 NOTE — Anesthesia Postprocedure Evaluation (Signed)
  Anesthesia Post-op Note  Patient: [name]  Procedure(s) Performed: Procedure(s): AORTIC VALVE REPLACEMENT (AVR) (N/A) INTRAOPERATIVE TRANSESOPHAGEAL ECHOCARDIOGRAM (N/A)  Patient Location: SICU  Anesthesia Type:General  Level of Consciousness: awake, alert  and oriented  Airway and Oxygen Therapy: Patient Spontanous Breathing and Patient connected to nasal cannula oxygen  Post-op Pain: mild  Post-op Assessment: Post-op Vital signs reviewed, Patient's Cardiovascular Status Stable, Respiratory Function Stable, Patent Airway, No signs of Nausea or vomiting and Pain level controlled  Post-op Vital Signs: Reviewed and stable  Complications: No apparent anesthesia complications

## 2013-09-14 NOTE — Progress Notes (Signed)
Utilization review completed. Monaca Wadas, RN, BSN. 

## 2013-09-14 NOTE — Progress Notes (Signed)
Patient ID: [name], male   DOB: Jul 29, 1929, 80 y.o.   MRN: [id number] EVENING ROUNDS NOTE :     [address]       ,[place] [address]             [phone]                 1 Day Post-Op Procedure(s) (LRB): AORTIC VALVE REPLACEMENT (AVR) (N/A) INTRAOPERATIVE TRANSESOPHAGEAL ECHOCARDIOGRAM (N/A)  Total Length of Stay:  LOS: 1 day  BP 192/103  Pulse 69  Temp(Src) 98.9 F (37.2 C) (Oral)  Resp 16  Wt 158 lb 15.2 oz (72.1 kg)  SpO2 100%  .Intake/Output     03/19 0701 - 03/20 0700   I.V. (mL/kg) 160 (2.2)   Blood    IV Piggyback 50   Total Intake(mL/kg) 210 (2.9)   Urine (mL/kg/hr) 1150 (1.3)   Blood    Chest Tube 60 (0.1)   Total Output 1210   Net -1000         . sodium chloride 20 mL/hr at 09/14/13 1231     Lab Results  Component Value Date   WBC 8.2 09/14/2013   HGB 11.8* 09/14/2013   HCT 34.0* 09/14/2013   PLT 173 09/14/2013   GLUCOSE 166* 09/14/2013   CHOL 121 05/16/2010   TRIG 41.0 05/16/2010   HDL 42.30 05/16/2010   LDLCALC 71 05/16/2010   ALT 14 09/11/2013   AST 24 09/11/2013   NA 142 09/14/2013   K 3.9 09/14/2013   CL 103 09/14/2013   CREATININE 0.84 09/14/2013   BUN 12 09/14/2013   CO2 21 09/14/2013   TSH 6.809* 07/23/2013   INR 1.58* 09/13/2013   HGBA1C 5.0 09/11/2013   BP increasing  ace restarted, Cr stable   Grace Isaac MD  Beeper [phone] Office [phone] 09/14/2013 7:16 PM

## 2013-09-14 NOTE — Anesthesia Postprocedure Evaluation (Signed)
  Anesthesia Post-op Note  Patient: [name]  Procedure(s) Performed: Procedure(s): AORTIC VALVE REPLACEMENT (AVR) (N/A) INTRAOPERATIVE TRANSESOPHAGEAL ECHOCARDIOGRAM (N/A)  Patient Location: SICU  Anesthesia Type:General  Level of Consciousness: awake, alert  and oriented  Airway and Oxygen Therapy: Patient Spontanous Breathing  Post-op Pain: mild  Post-op Assessment: Post-op Vital signs reviewed, Patient's Cardiovascular Status Stable, Respiratory Function Stable, Patent Airway, No signs of Nausea or vomiting and Pain level controlled  Post-op Vital Signs: stable  Complications: No apparent anesthesia complications

## 2013-09-14 NOTE — Progress Notes (Signed)
Dr. Servando Snare made aware pt's BP has been increasing throughout the day-now with SBP 180s. Made aware of current scheduled meds as well as PRN lopressor given with no relief. Made aware of home BP meds pt takes. Will restart altace-5mg  now and then daily. Will continue to monitor closely. Fulton Reek, RN

## 2013-09-14 NOTE — Procedures (Signed)
Extubation Procedure Note  Patient Details:   Name: [name] DOB: [birth year] MRN: [id number]   Airway Documentation:  Airway 7.5 mm (Active)  Secured at (cm) 21 cm 09/14/2013  3:25 AM  Measured From Lips 09/14/2013  3:25 AM  Secured Location Right 09/14/2013  3:25 AM  Secured By Pink Tape 09/14/2013  3:25 AM  Site Condition Dry 09/14/2013  3:25 AM    Evaluation  O2 sats: stable throughout Complications: No apparent complications Patient did tolerate procedure well. Bilateral Breath Sounds: Clear;Diminished Suctioning: Airway Yes  NIF -33cmH20 VC .660L  Pt following commands and had positive cuff leak.  ETT and oral suction performed prior to extubation.  Pt extubated to [place].  Pt vocalizing post-extubation and no stridor noted.  Incentive spirometry and pillow cough introduced.  Theo Dills 09/14/2013, 5:33 AM

## 2013-09-14 NOTE — Progress Notes (Signed)
Anesthesiology Follow-up:  Awake and alert, sitting in chair, neuro intact, in good spirits, taking po well.  VS: T-37.2 BP- 143/61 HR- 66 (SR) RR- 15% O2 Sat 98% on RA  K-3.7 BUN/Cr. 14/0.82 glucose 97 H/H: 10.2/29.0 Plts: 167,000  Extubated at 05:30 today. Doing well, one day S/P AVR with 25 mm Magna Ease tissue valve.  Stable post-op course  Roberts Gaudy, MD

## 2013-09-15 ENCOUNTER — Inpatient Hospital Stay (HOSPITAL_COMMUNITY): Payer: Medicare Other

## 2013-09-15 ENCOUNTER — Other Ambulatory Visit: Payer: Self-pay

## 2013-09-15 ENCOUNTER — Encounter (HOSPITAL_COMMUNITY): Payer: Self-pay | Admitting: Thoracic Surgery (Cardiothoracic Vascular Surgery)

## 2013-09-15 LAB — TYPE AND SCREEN
ABO/RH(D): A NEG
Antibody Screen: NEGATIVE
UNIT DIVISION: 0
Unit division: 0
Unit division: 0
Unit division: 0
Unit division: 0

## 2013-09-15 LAB — BASIC METABOLIC PANEL
BUN: 14 mg/dL (ref 6–23)
CALCIUM: 8.6 mg/dL (ref 8.4–10.5)
CHLORIDE: 105 meq/L (ref 96–112)
CO2: 25 meq/L (ref 19–32)
Creatinine, Ser: 0.8 mg/dL (ref 0.50–1.35)
GFR calc Af Amer: >90 mL/min (ref >90)
GFR calc non Af Amer: 82 mL/min — ABNORMAL LOW (ref >90)
Glucose, Bld: 133 mg/dL — ABNORMAL HIGH (ref 70–99)
Potassium: 4.1 mEq/L (ref 3.7–5.3)
SODIUM: 143 meq/L (ref 137–147)

## 2013-09-15 LAB — GLUCOSE, CAPILLARY
GLUCOSE-CAPILLARY: 129 mg/dL — AB (ref 70–99)
Glucose-Capillary: 108 mg/dL — ABNORMAL HIGH (ref 70–99)

## 2013-09-15 LAB — CBC
HCT: 35.2 % — ABNORMAL LOW (ref 39.0–52.0)
Hemoglobin: 12.2 g/dL — ABNORMAL LOW (ref 13.0–17.0)
MCH: 32 pg (ref 26.0–34.0)
MCHC: 34.7 g/dL (ref 30.0–36.0)
MCV: 92.4 fL (ref 78.0–100.0)
Platelets: 158 10*3/uL (ref 150–400)
RBC: 3.81 MIL/uL — ABNORMAL LOW (ref 4.22–5.81)
RDW: 15.8 % — ABNORMAL HIGH (ref 11.5–15.5)
WBC: 8.2 10*3/uL (ref 4.0–10.5)

## 2013-09-15 MED ORDER — SODIUM CHLORIDE 0.9 % IV SOLN: 250.0000 mL | INTRAVENOUS | Status: DC | PRN

## 2013-09-15 MED ORDER — ASPIRIN EC 325 MG PO TBEC
325.0000 mg | DELAYED_RELEASE_TABLET | Freq: Every day | ORAL | Status: DC
  Administered 2013-09-15 – 2013-09-19 (×5): 325 mg via ORAL
  Filled 2013-09-15 (×5): qty 1

## 2013-09-15 MED ORDER — METOPROLOL SUCCINATE ER 25 MG PO TB24
25.0000 mg | ORAL_TABLET | Freq: Two times a day (BID) | ORAL | Status: DC
  Administered 2013-09-15 (×2): 25 mg via ORAL
  Filled 2013-09-15 (×4): qty 1

## 2013-09-15 MED ORDER — SODIUM CHLORIDE 0.9 % IJ SOLN: 3.0000 mL | INTRAMUSCULAR | Status: DC | PRN

## 2013-09-15 MED ORDER — POTASSIUM CHLORIDE 10 MEQ/50ML IV SOLN
10.0000 meq | INTRAVENOUS | Status: AC
  Administered 2013-09-15 (×2): 10 meq via INTRAVENOUS
  Filled 2013-09-15 (×2): qty 50

## 2013-09-15 MED ORDER — FUROSEMIDE 40 MG PO TABS
40.0000 mg | ORAL_TABLET | Freq: Every day | ORAL | Status: DC
  Administered 2013-09-16 – 2013-09-19 (×4): 40 mg via ORAL
  Filled 2013-09-15 (×4): qty 1

## 2013-09-15 MED ORDER — RAMIPRIL 5 MG PO CAPS
5.0000 mg | ORAL_CAPSULE | Freq: Every day | ORAL | Status: DC
  Administered 2013-09-15 – 2013-09-16 (×2): 5 mg via ORAL
  Filled 2013-09-15 (×3): qty 1

## 2013-09-15 MED ORDER — FUROSEMIDE 10 MG/ML IJ SOLN
20.0000 mg | Freq: Three times a day (TID) | INTRAMUSCULAR | Status: AC
  Administered 2013-09-15 (×2): 20 mg via INTRAVENOUS
  Filled 2013-09-15 (×2): qty 2

## 2013-09-15 MED ORDER — METOPROLOL SUCCINATE ER 25 MG PO TB24: 25.0000 mg | ORAL_TABLET | Freq: Two times a day (BID) | ORAL | Status: DC

## 2013-09-15 MED ORDER — SODIUM CHLORIDE 0.9 % IJ SOLN
3.0000 mL | Freq: Two times a day (BID) | INTRAMUSCULAR | Status: DC
  Administered 2013-09-15 – 2013-09-19 (×7): 3 mL via INTRAVENOUS

## 2013-09-15 MED ORDER — AMIODARONE LOAD VIA INFUSION
150.0000 mg | Freq: Once | INTRAVENOUS | Status: AC
  Administered 2013-09-16: 150 mg via INTRAVENOUS
  Filled 2013-09-15: qty 83.34

## 2013-09-15 MED ORDER — AMIODARONE HCL IN DEXTROSE 360-4.14 MG/200ML-% IV SOLN
60.0000 mg/h | INTRAVENOUS | Status: AC
  Administered 2013-09-16 (×2): 60 mg/h via INTRAVENOUS
  Filled 2013-09-15 (×2): qty 200

## 2013-09-15 MED ORDER — POTASSIUM CHLORIDE CRYS ER 20 MEQ PO TBCR
20.0000 meq | EXTENDED_RELEASE_TABLET | Freq: Every day | ORAL | Status: DC
  Administered 2013-09-16 – 2013-09-19 (×4): 20 meq via ORAL
  Filled 2013-09-15 (×6): qty 1

## 2013-09-15 MED ORDER — NIACIN ER (ANTIHYPERLIPIDEMIC) 500 MG PO TBCR
1000.0000 mg | EXTENDED_RELEASE_TABLET | Freq: Every day | ORAL | Status: DC
  Administered 2013-09-16 – 2013-09-18 (×3): 1000 mg via ORAL
  Filled 2013-09-15 (×4): qty 2

## 2013-09-15 MED ORDER — METOPROLOL SUCCINATE ER 50 MG PO TB24: 50.0000 mg | ORAL_TABLET | Freq: Every day | ORAL | Status: DC

## 2013-09-15 MED ORDER — MOVING RIGHT ALONG BOOK
Freq: Once | Status: AC
  Administered 2013-09-15: 1
  Filled 2013-09-15: qty 1

## 2013-09-15 MED ORDER — GUAIFENESIN ER 600 MG PO TB12
600.0000 mg | ORAL_TABLET | Freq: Two times a day (BID) | ORAL | Status: DC | PRN
  Filled 2013-09-15: qty 1

## 2013-09-15 MED ORDER — TRAMADOL HCL 50 MG PO TABS: 50.0000 mg | ORAL_TABLET | ORAL | Status: DC | PRN

## 2013-09-15 MED ORDER — EZETIMIBE 10 MG PO TABS
10.0000 mg | ORAL_TABLET | Freq: Every day | ORAL | Status: DC
  Administered 2013-09-16 – 2013-09-19 (×4): 10 mg via ORAL
  Filled 2013-09-15 (×4): qty 1

## 2013-09-15 MED ORDER — AMIODARONE HCL IN DEXTROSE 360-4.14 MG/200ML-% IV SOLN
30.0000 mg/h | INTRAVENOUS | Status: AC
  Administered 2013-09-16: 30 mg/h via INTRAVENOUS
  Filled 2013-09-15 (×2): qty 200

## 2013-09-15 NOTE — Progress Notes (Signed)
CARDIAC REHAB PHASE I   PRE:  Rate/Rhythm: 88SR  BP:  Supine:   Sitting: 180/82  Standing:    SaO2: 97%RA  MODE:  Ambulation: 350 ft   POST:  Rate/Rhythm: 94   BP:  Supine:   Sitting: 192/80 left arm, 174/78 right arm  Standing:     SaO2:95%RA 1430-1500 Pt walked 350 ft on RA with rollator with fairly steady gait. Tolerated well except BP elevated. To sitting on side of bed after walk. Will notify RN of BP. Asst x 1 to walk.    Graylon Good, RN BSN  09/15/2013 2:56 PM

## 2013-09-15 NOTE — Progress Notes (Signed)
Patient's heart rate remains at 140-150.Per MD Roxy Manns new orders for Amiodarone full dose protocol. B/P 156/75. Patient remains in bed. Will continue to monitor.   Gregory Schmuck,MSN,RN

## 2013-09-15 NOTE — Progress Notes (Signed)
      [address]       [place] [address]             [phone]        CARDIOTHORACIC SURGERY PROGRESS NOTE   R2 Days Post-Op Procedure(s) (LRB): AORTIC VALVE REPLACEMENT (AVR) (N/A) INTRAOPERATIVE TRANSESOPHAGEAL ECHOCARDIOGRAM (N/A)  Subjective: Looks great.  Ambulating around SICU.  No complaints.  Objective: Vital signs: BP Readings from Last 1 Encounters:  09/15/13 206/85   Pulse Readings from Last 1 Encounters:  09/15/13 89   Resp Readings from Last 1 Encounters:  09/15/13 28   Temp Readings from Last 1 Encounters:  09/14/13 98.8 F (37.1 C) Oral    Hemodynamics: PAP: (48-58)/(12-20) 58/20 mmHg  Physical Exam:  Rhythm:   Sinus w/ PAC's  Breath sounds: clear  Heart sounds:  RRR  Incisions:  Dressing dry, intact  Abdomen:  Soft, non-distended, non-tender  Extremities:  Warm, well-perfused    Intake/Output from previous day: 03/19 0701 - 03/20 0700 In: 670 [P.O.:180; I.V.:390; IV Piggyback:100] Out: 2430 [Urine:2370; Chest Tube:60] Intake/Output this shift:    Lab Results:  CBC: Recent Labs  09/14/13 1610 09/15/13 0420  WBC 8.2 8.2  HGB 11.8* 12.2*  HCT 34.0* 35.2*  PLT 173 158    BMET:  Recent Labs  09/14/13 0355 09/14/13 1608 09/14/13 1610 09/15/13 0420  NA 141 142  --  143  K 3.7 3.9  --  4.1  CL 107 103  --  105  CO2 21  --   --  25  GLUCOSE 120* 166*  --  133*  BUN 14 12  --  14  CREATININE 0.82 0.90 0.84 0.80  CALCIUM 8.2*  --   --  8.6     CBG (last 3)   Recent Labs  09/14/13 1921 09/14/13 2333 09/15/13 0419  GLUCAP 129* 110* 129*    ABG    Component Value Date/Time   PHART 7.343* 09/14/2013 0618   PCO2ART 40.4 09/14/2013 0618   PO2ART 138.0* 09/14/2013 0618   HCO3 21.7 09/14/2013 0618   TCO2 22 09/14/2013 1608   ACIDBASEDEF 3.0* 09/14/2013 0618   O2SAT 99.0 09/14/2013 0618    CXR: PORTABLE CHEST - 1 VIEW  COMPARISON: September 14, 2013  FINDINGS:  Swan-Ganz catheter has been removed. Cordis  tip is in the superior  vena cava. No pneumothorax apparent. There is a skin fold on the  left.  Cardiomegaly is stable. There is stable elevation of the left  hemidiaphragm. There is mild interstitial edema. There is no  consolidation. The pulmonary vascularity is within normal limits.  IMPRESSION:  Mild interstitial edema. Stable cardiomegaly. Stable elevation of  the left hemidiaphragm. No pneumothorax.  Electronically Signed  By: Lowella Grip M.D.  On: 09/15/2013 07:48   Assessment/Plan: S/P Procedure(s) (LRB): AORTIC VALVE REPLACEMENT (AVR) (N/A) INTRAOPERATIVE TRANSESOPHAGEAL ECHOCARDIOGRAM (N/A)  Doing very well POD2 Maintaining NSR w/ stable BP, O2 sats 96-99% on RA Hypertension Expected post op acute blood loss anemia, mild, stable Expected post op volume excess, diuresing Expected post op atelectasis, mild Chronic elevation left hemidiaphragm w/ large hiatal hernia  Chronic thrombocytopenia, platelet count stable   Mobilize  Continue diuretics  Increase beta blocker and resume ACE-I  Transfer 2W    Gregory,[name] 09/15/2013 8:02 AM

## 2013-09-15 NOTE — Progress Notes (Addendum)
Patient's heart rate 120-130s. Patient states feels heart beating in chest. EKg assessed patient in Afib, hrt rate 132. Patient lying in bed, b/p 145/78. ra 98%. MD notified. Awaiting return call. Patient given 5mg  IV metoprolol per PRN order hrt >130.     Venita Lick Anuoluwapo Mefferd,MSN,RN [phone]

## 2013-09-15 NOTE — Addendum Note (Signed)
Addendum created 09/15/13 1157 by Jazzlyn Huizenga, MD   Modules edited: Anesthesia Attestations    

## 2013-09-16 ENCOUNTER — Inpatient Hospital Stay (HOSPITAL_COMMUNITY): Payer: Medicare Other

## 2013-09-16 LAB — BASIC METABOLIC PANEL
BUN: 14 mg/dL (ref 6–23)
CO2: 25 meq/L (ref 19–32)
CREATININE: 0.62 mg/dL (ref 0.50–1.35)
Calcium: 8.4 mg/dL (ref 8.4–10.5)
Chloride: 101 mEq/L (ref 96–112)
GFR calc Af Amer: >90 mL/min (ref >90)
GFR calc non Af Amer: >90 mL/min (ref >90)
Glucose, Bld: 135 mg/dL — ABNORMAL HIGH (ref 70–99)
Potassium: 3.7 mEq/L (ref 3.7–5.3)
Sodium: 139 mEq/L (ref 137–147)

## 2013-09-16 LAB — CBC
HCT: 36.4 % — ABNORMAL LOW (ref 39.0–52.0)
Hemoglobin: 12.4 g/dL — ABNORMAL LOW (ref 13.0–17.0)
MCH: 31.6 pg (ref 26.0–34.0)
MCHC: 34.1 g/dL (ref 30.0–36.0)
MCV: 92.9 fL (ref 78.0–100.0)
Platelets: 145 10*3/uL — ABNORMAL LOW (ref 150–400)
RBC: 3.92 MIL/uL — AB (ref 4.22–5.81)
RDW: 15.4 % (ref 11.5–15.5)
WBC: 7.6 10*3/uL (ref 4.0–10.5)

## 2013-09-16 MED ORDER — METOPROLOL SUCCINATE ER 50 MG PO TB24
50.0000 mg | ORAL_TABLET | Freq: Two times a day (BID) | ORAL | Status: DC
  Filled 2013-09-16 (×2): qty 1

## 2013-09-16 MED ORDER — AMIODARONE HCL 200 MG PO TABS
400.0000 mg | ORAL_TABLET | Freq: Two times a day (BID) | ORAL | Status: DC
  Administered 2013-09-17 – 2013-09-19 (×5): 400 mg via ORAL
  Filled 2013-09-16 (×7): qty 2

## 2013-09-16 MED ORDER — AMIODARONE HCL 200 MG PO TABS: 400.0000 mg | ORAL_TABLET | Freq: Two times a day (BID) | ORAL | Status: DC

## 2013-09-16 MED ORDER — AMIODARONE IV BOLUS ONLY 150 MG/100ML
150.0000 mg | Freq: Once | INTRAVENOUS | Status: DC
Start: 2013-09-16 — End: 2013-09-16
  Filled 2013-09-16: qty 100

## 2013-09-16 MED ORDER — METOPROLOL TARTRATE 50 MG PO TABS
50.0000 mg | ORAL_TABLET | Freq: Two times a day (BID) | ORAL | Status: DC
  Administered 2013-09-16 – 2013-09-19 (×7): 50 mg via ORAL
  Filled 2013-09-16 (×8): qty 1

## 2013-09-16 MED ORDER — AMIODARONE HCL 200 MG PO TABS
400.0000 mg | ORAL_TABLET | Freq: Two times a day (BID) | ORAL | Status: DC
  Administered 2013-09-16: 400 mg via ORAL
  Filled 2013-09-16 (×3): qty 2

## 2013-09-16 MED ORDER — AMIODARONE HCL 200 MG PO TABS
400.0000 mg | ORAL_TABLET | Freq: Once | ORAL | Status: AC
  Administered 2013-09-16: 400 mg via ORAL
  Filled 2013-09-16: qty 2

## 2013-09-16 NOTE — Progress Notes (Addendum)
      [address]       ,[place] [address]             [phone]      3 Days Post-Op Procedure(s) (LRB): AORTIC VALVE REPLACEMENT (AVR) (N/A) INTRAOPERATIVE TRANSESOPHAGEAL ECHOCARDIOGRAM (N/A)  Subjective:  [name] has no new complaints this morning.  He is back in A. Fib  Objective: Vital signs in last 24 hours: Temp:  [98.8 F (37.1 C)-99.8 F (37.7 C)] 98.9 F (37.2 C) (03/21 0445) Pulse Rate:  [78-142] 103 (03/21 0445) Cardiac Rhythm:  [-] Atrial fibrillation (03/20 2247) Resp:  [16-24] 18 (03/21 0445) BP: (123-156)/(59-90) 134/90 mmHg (03/21 0445) SpO2:  [96 %-100 %] 96 % (03/21 0445) Weight:  [153 lb 3.5 oz (69.5 kg)] 153 lb 3.5 oz (69.5 kg) (03/21 0445)  Intake/Output from previous day: 03/20 0701 - 03/21 0700 In: 693 [P.O.:480; I.V.:63; IV Piggyback:150] Out: 1390 [Urine:1390]  General appearance: alert, cooperative and no distress Heart: irregularly irregular rhythm Lungs: clear to auscultation bilaterally Abdomen: soft, non-tender; bowel sounds normal; no masses,  no organomegaly Extremities: edema none appreciated Wound: clean and dry  Lab Results:  Recent Labs  09/15/13 0420 09/16/13 0710  WBC 8.2 7.6  HGB 12.2* 12.4*  HCT 35.2* 36.4*  PLT 158 145*   BMET:  Recent Labs  09/14/13 0355 09/14/13 1608 09/14/13 1610 09/15/13 0420  NA 141 142  --  143  K 3.7 3.9  --  4.1  CL 107 103  --  105  CO2 21  --   --  25  GLUCOSE 120* 166*  --  133*  BUN 14 12  --  14  CREATININE 0.82 0.90 0.84 0.80  CALCIUM 8.2*  --   --  8.6    PT/INR:  Recent Labs  09/13/13 1400  LABPROT 18.4*  INR 1.58*   ABG    Component Value Date/Time   PHART 7.343* 09/14/2013 0618   HCO3 21.7 09/14/2013 0618   TCO2 22 09/14/2013 1608   ACIDBASEDEF 3.0* 09/14/2013 0618   O2SAT 99.0 09/14/2013 0618   CBG (last 3)   Recent Labs  09/14/13 2333 09/15/13 0419 09/15/13 0735  GLUCAP 110* 129* 108*    Assessment/Plan: S/P Procedure(s)  (LRB): AORTIC VALVE REPLACEMENT (AVR) (N/A) INTRAOPERATIVE TRANSESOPHAGEAL ECHOCARDIOGRAM (N/A)   1. CV- Atrial Fibrillation- currently on IV Amiodarone, will repeat bolus- continue Lopressor increase to 50 mg BID this morning 2. Pulm- no acute issues, encouraged IS 3. Renal- creatinine has been WNL, mildly volume overloaded continue diuresis, no LE edema present 4. Dispo- patient remains in A.Fib this morning, will repeat bolus, Lopressor increased, if doesn't convert may need to consider Coumadin   LOS: 3 days    BARRETT, ERIN 09/16/2013  I have seen and examined the patient and agree with the assessment and plan as outlined.  Now back in sinus rhythm  Sonoma Firkus H 09/16/2013 10:58 AM

## 2013-09-16 NOTE — Progress Notes (Signed)
CARDIAC REHAB PHASE I   PRE:  Rate/Rhythm: 81 sinus  BP:  Sitting: 136/74   SaO2: 94 RA  MODE:  Ambulation: 350 ft   POST:  Rate/Rhythem: 87  BP:  Sitting: 134/72   SaO2: 95 RA  Pt ambulated 350 ft with assist x1 using rolling walker.  Pt tolerated walk well, did c/o SOB and fatigue on return.  Pt returned to bedside after walk with family in room and call bell in reach.  Pt encouraged to walk with staff over weekend.  We will follow up on Monday. Alberteen Sam, MA, ACSM RCEP [phone]  Clotilde Dieter

## 2013-09-17 MED ORDER — RAMIPRIL 10 MG PO CAPS
10.0000 mg | ORAL_CAPSULE | Freq: Every day | ORAL | Status: DC
  Administered 2013-09-17: 10 mg via ORAL
  Filled 2013-09-17 (×2): qty 1

## 2013-09-17 NOTE — Progress Notes (Addendum)
      [address]       [place] [address]             [phone]      4 Days Post-Op Procedure(s) (LRB): AORTIC VALVE REPLACEMENT (AVR) (N/A) INTRAOPERATIVE TRANSESOPHAGEAL ECHOCARDIOGRAM (N/A)  Subjective:  [name] has no complaints this morning.  He is ambulating. + BM  Objective: Vital signs in last 24 hours: Temp:  [98.4 F (36.9 C)-98.9 F (37.2 C)] 98.4 F (36.9 C) (03/22 0437) Pulse Rate:  [66-81] 66 (03/22 0437) Cardiac Rhythm:  [-] Atrial fibrillation (03/21 1950) Resp:  [18] 18 (03/22 0437) BP: (180-187)/(82-84) 180/84 mmHg (03/22 0437) SpO2:  [95 %-96 %] 95 % (03/22 0437) Weight:  [150 lb 2.1 oz (68.1 kg)] 150 lb 2.1 oz (68.1 kg) (03/22 0437)  Intake/Output from previous day: 03/21 0701 - 03/22 0700 In: 1080 [P.O.:1080] Out: 1225 [Urine:1225]  General appearance: alert, cooperative and no distress Heart: regular rate and rhythm Lungs: clear to auscultation bilaterally Abdomen: soft, non-tender; bowel sounds normal; no masses,  no organomegaly Extremities: edema trace Wound: clean and dry  Lab Results:  Recent Labs  09/15/13 0420 09/16/13 0710  WBC 8.2 7.6  HGB 12.2* 12.4*  HCT 35.2* 36.4*  PLT 158 145*   BMET:  Recent Labs  09/15/13 0420 09/16/13 0710  NA 143 139  K 4.1 3.7  CL 105 101  CO2 25 25  GLUCOSE 133* 135*  BUN 14 14  CREATININE 0.80 0.62  CALCIUM 8.6 8.4    PT/INR: No results found for this basename: LABPROT, INR,  in the last 72 hours ABG    Component Value Date/Time   PHART 7.343* 09/14/2013 0618   HCO3 21.7 09/14/2013 0618   TCO2 22 09/14/2013 1608   ACIDBASEDEF 3.0* 09/14/2013 0618   O2SAT 99.0 09/14/2013 0618   CBG (last 3)   Recent Labs  09/14/13 2333 09/15/13 0419 09/15/13 0735  GLUCAP 110* 129* 108*    Assessment/Plan: S/P Procedure(s) (LRB): AORTIC VALVE REPLACEMENT (AVR) (N/A) INTRAOPERATIVE TRANSESOPHAGEAL ECHOCARDIOGRAM (N/A)  1. CV- Previous A.Fib, maintaining NSR,  Hypertensive- continue Amiodarone, Lopressor, will increase Altace to 10 mg daily 2. Pulm- no acute issues, continue IS 3. Renal- remains volume overloaded, continue Lasix 4. Dispo- patient progressing, maintaining NSR will d/c EPW, likely home in AM   LOS: 4 days    [name] 09/17/2013  I have seen and examined the patient and agree with the assessment and plan as outlined.  However, [name] has gone back into Afib w/ HR 100-110.  [name] 09/17/2013 11:07 AM

## 2013-09-17 NOTE — Progress Notes (Signed)
B/P down to 142/80. Will continue to assess

## 2013-09-17 NOTE — Progress Notes (Signed)
BP is 200/100 then on recheck by manual cuff is 190/100. Pt denies complaints at this time.  PO lopressor 50mg  given per order.Will re-check pressure.

## 2013-09-17 NOTE — Discharge Summary (Signed)
Physician Discharge Summary  Patient ID: [name] MRN: [id number] DOB/AGE: 1930/04/20 80 y.o.  Admit date: 09/13/2013 Discharge date: 09/19/2013  Admission Diagnoses:  Patient Active Problem List   Diagnosis Date Noted  . Aortic stenosis, severe 08/22/2013  . Thrombocytopenia 08/17/2013  . Pulsatile groin mass 08/17/2013  . CAD (coronary artery disease) 08/17/2013  . Rash and nonspecific skin eruption 07/31/2013  . Allergic dermatitis 07/31/2013  . PREMATURE VENTRICULAR CONTRACTIONS 05/13/2010  . PALPITATIONS 05/07/2010  . ARM NUMBNESS 01/04/2009  . HYPERLIPIDEMIA 01/02/2009  . HYPERTENSION 01/02/2009  . AORTIC STENOSIS 01/02/2009  . CEREBROVASCULAR DISEASE 01/02/2009  . DYSPNEA 01/02/2009   Discharge Diagnoses:   Patient Active Problem List   Diagnosis Date Noted  . S/P aortic valve replacement with bioprosthetic valve 09/13/2013  . Aortic stenosis, severe 08/22/2013  . Thrombocytopenia 08/17/2013  . Pulsatile groin mass 08/17/2013  . CAD (coronary artery disease) 08/17/2013  . Rash and nonspecific skin eruption 07/31/2013  . Allergic dermatitis 07/31/2013  . PREMATURE VENTRICULAR CONTRACTIONS 05/13/2010  . PALPITATIONS 05/07/2010  . ARM NUMBNESS 01/04/2009  . HYPERLIPIDEMIA 01/02/2009  . HYPERTENSION 01/02/2009  . AORTIC STENOSIS 01/02/2009  . CEREBROVASCULAR DISEASE 01/02/2009  . DYSPNEA 01/02/2009   Discharged Condition: good  History of Present Illness:    [name] is a 80 yo white male with known history of aortic stenosis, hypertension, and hyperlipidemia.  He was originally noted to have a heart murmur several years ago on physical exam.  He was diagnosed with having Aortic Stenosis.  Since that time he has been routinely followed with serial Echocardiograms.  The patient had been in his usual state of health until December of last year.  He states that he noticed he was developing flu like symptoms.  These progressed into shortness of breath with worsening  cough and development of hemoptysis.  CT scan was obtained and confirmed the presence of pneumonia and he was admitted to the hospital for IV Antibiotics.  During that hospitalization the patient underwent repeat Echocardiogram which showed severe Aortic Stenosis with preserved LV Function.  He also underwent cardiac catheterizaiton which did not show evidence of coronary artery disease.  The patient was discharged home once his symptoms had improved.  He followed up with Dr. Stanford Breed felt he would benefit from Aortic Valve Replacement and the patient was referred to [facility] for consultation.  He was evaluated by Dr. Roxy Manns on 08/22/2013.  At that visit it was felt the patient would benefit from Aortic Valve Replacement. The risks and benefits of the procedure were explained to the patient and his family and he was agreeable to proceed.      Hospital Course:   The patient presented to [facility] on 09/14/2103.  He was taken to the operating room and underwent Aortic Valve replacement utilizing a 67mm [employer] Pericardial Tissue Valve.  He tolerated the procedure well and was taken to the SICU in stable condition.  The patient was treated for coagulopathy overnight with packed cells, FFP, cryoprecipitate, and platelets.  He was weaned off drips as tolerated.  During her stay in the ICU the patient was extubated the morning of POD #1.  The patient's chest tubes and arterial lines were removed without difficulty.  The patient developed Hypertension and was placed on his home ACE inhibitor and a beta blocker.  He was ambulating independently and was transferred to the telemetry unit in stable condition.    The night of transfer the patient developed Atrial Fibrillation.  He was subsequently treated with IV Amiodarone.  The patient remained in Atrial Fibrillation until the next morning at which time he converted to NSR.  The patient maintained NSR throughout the day, but again developed Atrial  Fibrillation.He again converted to sinus rhythm and remained in it.  The patient again became hypertensive and his Altace dose was increased.  He continues to ambulate with minimal difficulty.  He is tolerating a regular diet.  The patient is medically stable for discharge today.  He will follow up with Dr. Roxy Manns in 3 weeks with a CXR prior to his appointment.  He also will see Dr. Harl Bowie within 2 weeks.  Significant Diagnostic Studies:   ECHO:  Left ventricle: Poor acoustic windows limit study [place] LVEF appears moderately depressed with hypokinesis/akinesis of the inferior, posterior and inferolatel The cavity size was normal. Systolic function was normal. The estimated ejection fraction was in the range of 60% to 65%. Doppler parameters are consistent with abnormal left ventricular relaxation (grade 1 diastolic dysfunction). - Aortic valve: AV is not well visualized. Appears thickened. Peak and mean gradients through the valve are 82 and 52 mm Hg respectively consistent with severe/critical AS.  Treatments:  Aortic Valve Replacement Edwards Magna Ease Pericardial Tissue Valve (size 25 mm, model # 3300TFX, serial # [id number])  Disposition: 01-Home or Self Care  Discharge Medications:    Medication List    STOP taking these medications       metoprolol succinate 50 MG 24 hr tablet  Commonly known as:  TOPROL-XL      TAKE these medications       amiodarone 200 MG tablet  Commonly known as:  PACERONE  Take 1 tablet (200 mg total) by mouth daily.     aspirin 325 MG EC tablet  Take 325 mg by mouth daily.     dorzolamide-timolol 22.3-6.8 MG/ML ophthalmic solution  Commonly known as:  COSOPT  Place 1 drop into both eyes 2 (two) times daily.     ezetimibe 10 MG tablet  Commonly known as:  ZETIA  Take 10 mg by mouth daily.     furosemide 40 MG tablet  Commonly known as:  LASIX  Take 1 tablet (40 mg total) by mouth daily. For 5 days then stop.     guaiFENesin 600 MG  12 hr tablet  Commonly known as:  MUCINEX  Take 600 mg by mouth 2 (two) times daily as needed for cough or to loosen phlegm.     metoprolol 50 MG tablet  Commonly known as:  LOPRESSOR  Take 1 tablet (50 mg total) by mouth 2 (two) times daily.     niacin 1000 MG CR tablet  Commonly known as:  NIASPAN  Take 1,000 mg by mouth at bedtime.     potassium chloride SA 20 MEQ tablet  Commonly known as:  K-DUR,KLOR-CON  Take 1 tablet (20 mEq total) by mouth daily. For 5 days then stop.     ramipril 10 MG capsule  Commonly known as:  ALTACE  Take 1 capsule (10 mg total) by mouth 2 (two) times daily.     traMADol 50 MG tablet  Commonly known as:  ULTRAM  Take 1 tablet (50 mg total) by mouth every 6 (six) hours as needed for moderate pain.     Travoprost (BAK Free) 0.004 % Soln ophthalmic solution  Commonly known as:  TRAVATAN  Place 1 drop into both eyes at bedtime.       The patient has  been discharged on:   1.Beta Blocker:  Yes [x   ]                              No   [   ]                              If No, reason:  2.Ace Inhibitor/ARB: Yes [  x ]                                     No  [    ]                                     If No, reason:  3.Statin:   Yes [   ]                  No  [x   ]                  If No, reason: No CAD  4.Shela CommonsVelta Addison  [  x ]                  No   [   ]                  If No, reason:    Future Appointments Provider Department Dept Phone   10/11/2013 2:30 PM Gaye Pollack, MD [facility] [phone]   11/16/2013 2:15 PM Lelon Perla, MD [place] Office [phone]      Follow-up Information   Follow up with Rexene Alberts, MD. (PA/LAT CXR to be taken (at [facility] which is in the same building as Dr. Guy Sandifer office) one hour prior to office appointment;Office will mail appointment date and time)    Specialty:  Cardiothoracic Surgery   Contact information:   [address] [place] [address] [phone]       Follow up with Kirk Ruths, MD. (Call for a 2 week follow up appointment)    Specialty:  Cardiology   Contact information:   [name] [place] [address] [phone]       Signed: Nani Skillern PA-C 09/19/2013, 9:26 AM

## 2013-09-17 NOTE — Progress Notes (Signed)
Orders to remove EPW placed this morning. However, patient in AFIB HR in 120's. MD and PA made aware of this change. Will hold pulling EPW at this time. Elmarie Shiley R

## 2013-09-18 LAB — BASIC METABOLIC PANEL
BUN: 20 mg/dL (ref 6–23)
CO2: 28 mEq/L (ref 19–32)
Calcium: 8.7 mg/dL (ref 8.4–10.5)
Chloride: 101 mEq/L (ref 96–112)
Creatinine, Ser: 0.78 mg/dL (ref 0.50–1.35)
GFR, EST NON AFRICAN AMERICAN: 82 mL/min — AB (ref >90)
Glucose, Bld: 111 mg/dL — ABNORMAL HIGH (ref 70–99)
Potassium: 3.8 mEq/L (ref 3.7–5.3)
Sodium: 141 mEq/L (ref 137–147)

## 2013-09-18 LAB — MAGNESIUM: MAGNESIUM: 1.8 mg/dL (ref 1.5–2.5)

## 2013-09-18 MED ORDER — RAMIPRIL 10 MG PO CAPS
10.0000 mg | ORAL_CAPSULE | Freq: Two times a day (BID) | ORAL | Status: DC
  Administered 2013-09-18 – 2013-09-19 (×3): 10 mg via ORAL
  Filled 2013-09-18 (×4): qty 1

## 2013-09-18 MED ORDER — FUROSEMIDE 10 MG/ML IJ SOLN
40.0000 mg | Freq: Once | INTRAMUSCULAR | Status: AC
  Administered 2013-09-18: 40 mg via INTRAVENOUS
  Filled 2013-09-18: qty 4

## 2013-09-18 MED ORDER — POTASSIUM CHLORIDE CRYS ER 20 MEQ PO TBCR
40.0000 meq | EXTENDED_RELEASE_TABLET | Freq: Once | ORAL | Status: AC
  Administered 2013-09-18: 40 meq via ORAL
  Filled 2013-09-18: qty 2

## 2013-09-18 MED ORDER — POTASSIUM CHLORIDE CRYS ER 20 MEQ PO TBCR
20.0000 meq | EXTENDED_RELEASE_TABLET | Freq: Once | ORAL | Status: AC
  Administered 2013-09-18: 20 meq via ORAL
  Filled 2013-09-18: qty 1

## 2013-09-18 NOTE — Progress Notes (Signed)
CARDIAC REHAB PHASE I   PRE:  Rate/Rhythm: 92SR  BP:  Supine: 186/90  Sitting:   Standing:    SaO2: 96-98%RA  MODE:  Ambulation: 750 ft   POST:  Rate/Rhythm: 98  BP:  Supine:   Sitting: 200/98,  180/90 with rest  Standing:    SaO2: 100%RA 1030-1103 Pt walked 750 ft with rolling walker and asst x 1. Encouraged pt to stay close to walker. To recliner after walk. Family in room. BP elevated before and after walk. Notified pt's RN. Tolerated walk well otherwise. Wants rolling walker for home as the walker he has does not have wheels and was given to him by family.   Graylon Good, RN BSN  09/18/2013 11:00 AM

## 2013-09-18 NOTE — Progress Notes (Signed)
B/P 190/90 pt had been sleeping. IV Lopressor given as ordered. Started with 2.5mg  IV as HR is 77.Will monitor Heart rate and b/p. HR to 68 SR. B/P is down a little bit , but since has dropped below 70 will not give addditional Lopressor at this time. Will continue to assess.

## 2013-09-18 NOTE — Progress Notes (Addendum)
      [address]       [place] [address]             [phone]        5 Days Post-Op Procedure(s) (LRB): AORTIC VALVE REPLACEMENT (AVR) (N/A) INTRAOPERATIVE TRANSESOPHAGEAL ECHOCARDIOGRAM (N/A)  Subjective: Patient about to eat breakfast. No complaints.  Objective: Vital signs in last 24 hours: Temp:  [97.2 F (36.2 C)-98.2 F (36.8 C)] 98 F (36.7 C) (03/23 0516) Pulse Rate:  [68-108] 68 (03/23 0541) Cardiac Rhythm:  [-] Normal sinus rhythm (03/23 0621) Resp:  [18-20] 20 (03/23 0516) BP: (142-200)/(69-101) 170/80 mmHg (03/23 0621) SpO2:  [95 %-100 %] 95 % (03/23 0516) Weight:  [149 lb 7.6 oz (67.8 kg)] 149 lb 7.6 oz (67.8 kg) (03/23 0516)  Pre op weight 63 kg Current Weight  09/18/13 149 lb 7.6 oz (67.8 kg)      Intake/Output from previous day: 03/22 0701 - 03/23 0700 In: -  Out: 700 [Urine:700]   Physical Exam:  Cardiovascular: RRR, no murmurs, gallops, or rubs. Pulmonary: Clear to auscultation bilaterally; no rales, wheezes, or rhonchi. Abdomen: Soft, non tender, bowel sounds present. Extremities: Mild bilateral lower extremity edema. Wounds: Clean and dry.  No erythema or signs of infection.  Lab Results: CBC: Recent Labs  09/16/13 0710  WBC 7.6  HGB 12.4*  HCT 36.4*  PLT 145*   BMET:  Recent Labs  09/16/13 0710 09/18/13 0530  NA 139 141  K 3.7 3.8  CL 101 101  CO2 25 28  GLUCOSE 135* 111*  BUN 14 20  CREATININE 0.62 0.78  CALCIUM 8.4 8.7    PT/INR:  Lab Results  Component Value Date   INR 1.58* 09/13/2013   INR 1.04 09/11/2013   INR 1.17 07/26/2013   ABG:  INR: Will add last result for INR, ABG once components are confirmed Will add last 4 CBG results once components are confirmed  Assessment/Plan:  1. CV - Previous a fib. SR this am. Hypertensive. On Amiodarone 400 bid, Lopressor 50 bid and Ramipril 10 daily. Monitor bp this am as may need to adjust medications. 2.  Pulmonary - Encourage incentive  spirometer 3. Volume Overload - On Lasix 40 daily 4.  Mild acute blood loss anemia - Last H and H 12.4 and 36.4 5. Supplement potassium 6. Remove EPW 7. Possible discharge in am  ZIMMERMAN,DONIELLE MPA-C 09/18/2013,7:28 AM  I have seen and examined the patient and agree with the assessment and plan as outlined.  Will increase ramipril to 10 bid, although I am reluctant to increase the dosages of either metoprolol or ramipril much further as they are both already double his pre-admission dosages, and his mean arterial pressure is acceptable.  He clearly has decreased vascular compliance.  Decrease amiodarone to 200 bid at discharge.  Mr Allende looks good although he complains that he still has a tendency to "give out" easily.  He is still 4 kg above his baseline weight.  Will give 1 extra dose lasix this afternoon.  Possible d/c home 1-2 days.  Jadalee Westcott H 09/18/2013 9:14 AM

## 2013-09-19 MED ORDER — AMIODARONE HCL 200 MG PO TABS: 200.0000 mg | ORAL_TABLET | Freq: Every day | ORAL | Status: DC

## 2013-09-19 MED ORDER — POTASSIUM CHLORIDE CRYS ER 20 MEQ PO TBCR: 20.0000 meq | EXTENDED_RELEASE_TABLET | Freq: Every day | ORAL | Status: DC

## 2013-09-19 MED ORDER — TRAMADOL HCL 50 MG PO TABS: 50.0000 mg | ORAL_TABLET | Freq: Four times a day (QID) | ORAL | Status: DC | PRN

## 2013-09-19 MED ORDER — METOPROLOL TARTRATE 50 MG PO TABS: 50.0000 mg | ORAL_TABLET | Freq: Two times a day (BID) | ORAL | Status: DC

## 2013-09-19 MED ORDER — FUROSEMIDE 40 MG PO TABS: 40.0000 mg | ORAL_TABLET | Freq: Every day | ORAL | Status: DC

## 2013-09-19 MED ORDER — RAMIPRIL 10 MG PO CAPS: 10.0000 mg | ORAL_CAPSULE | Freq: Two times a day (BID) | ORAL | Status: DC

## 2013-09-19 NOTE — Care Management Note (Signed)
    Page 1 of 1   09/19/2013     3:06:31 PM   CARE MANAGEMENT NOTE 09/19/2013  Patient:  [name], [name]   Account Number:  [id number]  Date Initiated:  09/18/2013  Documentation initiated by:  Ivyrose Hashman  Subjective/Objective Assessment:   PT S/P AVR ON 09/13/13.  PTA, PT INDEPENDENT, LIVES WITH SPOUSE.     Action/Plan:   WIFE TO PROVIDE CARE AT DC.  REQUESTS RW FOR HOME USE. REFERRAL TO [facility] FOR DME NEEDS.   Anticipated DC Date:  09/19/2013   Anticipated DC Plan:  [name]  CM consult      Choice offered to / List presented to:     DME arranged  [name]      DME agency  [place].        Status of service:  Completed, signed off Medicare Important Message given?   (If response is "NO", the following Medicare IM given date fields will be blank) Date Medicare IM given:   Date Additional Medicare IM given:    Discharge Disposition:  HOME/SELF CARE  Per UR Regulation:  Reviewed for med. necessity/level of care/duration of stay  If discussed at [place] of Stay Meetings, dates discussed:    Comments:

## 2013-09-19 NOTE — Progress Notes (Signed)
DC chest tube sutures per MD orders; steri strips and benzoin applied; will continue to monitor pt.  Carollee Sires, RN

## 2013-09-19 NOTE — Progress Notes (Signed)
1000-1030 Education completed with pt and wife. Pt waiting on rolling walker for home use. Encouraged IS. Declined CRP 2 as pt states too far to drive. Wants to just walk on his own. Encouraged them to watch discharge video prior to discharge.  Graylon Good RN BSN 09/19/2013 10:29 AM

## 2013-09-19 NOTE — Progress Notes (Addendum)
      [address]       [place] [address]             [phone]        6 Days Post-Op Procedure(s) (LRB): AORTIC VALVE REPLACEMENT (AVR) (N/A) INTRAOPERATIVE TRANSESOPHAGEAL ECHOCARDIOGRAM (N/A)  Subjective: Patient without complaints.  Objective: Vital signs in last 24 hours: Temp:  [98 F (36.7 C)-98.2 F (36.8 C)] 98.2 F (36.8 C) (03/24 0500) Pulse Rate:  [74-90] 75 (03/24 0500) Cardiac Rhythm:  [-] Normal sinus rhythm (03/23 2000) Resp:  [17-20] 17 (03/24 0500) BP: (141-191)/(70-93) 191/88 mmHg (03/24 0500) SpO2:  [97 %-100 %] 97 % (03/24 0500) Weight:  [144 lb 13.5 oz (65.7 kg)] 144 lb 13.5 oz (65.7 kg) (03/24 0500)  Pre op weight 63 kg Current Weight  09/19/13 144 lb 13.5 oz (65.7 kg)      Intake/Output from previous day: 03/23 0701 - 03/24 0700 In: 480 [P.O.:480] Out: 950 [Urine:950]   Physical Exam:  Cardiovascular: RRR, no murmurs, gallops, or rubs. Pulmonary: Clear to auscultation bilaterally; no rales, wheezes, or rhonchi. Abdomen: Soft, non tender, bowel sounds present. Extremities: Trace bilateral lower extremity edema. Wounds: Clean and dry.  No erythema or signs of infection.  Lab Results: CBC:No results found for this basename: WBC, HGB, HCT, PLT,  in the last 72 hours BMET:   Recent Labs  09/18/13 0530  NA 141  K 3.8  CL 101  CO2 28  GLUCOSE 111*  BUN 20  CREATININE 0.78  CALCIUM 8.7    PT/INR:  Lab Results  Component Value Date   INR 1.58* 09/13/2013   INR 1.04 09/11/2013   INR 1.17 07/26/2013   ABG:  INR: Will add last result for INR, ABG once components are confirmed Will add last 4 CBG results once components are confirmed  Assessment/Plan:  1. CV - Previous a fib. SR this am. Hypertensive and decreased vascular compliance. On Amiodarone 400 bid, Lopressor 50 bid and Ramipril 10 bid. Will decrease Amiodarone to 200 daily at discharge.  2.  Pulmonary - Encourage incentive spirometer 3. Volume  Overload - On Lasix 40 daily 4.  Mild acute blood loss anemia - Last H and H 12.4 and 36.4 5. Remove sutures 6. Possible discharge today   ZIMMERMAN,DONIELLE MPA-C 09/19/2013,7:31 AM  .  I have seen and examined the patient and agree with the assessment and plan as outlined.  Discussed blood pressure management w/ Dr Stanford Breed who recommended leaving meds as they are currently w/ plans to f/u as outpatient.  I have instructed Mr Veals to check his BP every morning and keep a log.  D/C home today.  Brazos Sandoval H 09/19/2013 8:02 AM

## 2013-09-19 NOTE — Progress Notes (Signed)
DC IV and tele per MD orders; DC instructions reviewed with patient and spouse; prescriptions given to spouse; no further questions at this time from pt and spouse.  Carollee Sires, RN

## 2013-09-20 ENCOUNTER — Other Ambulatory Visit: Payer: Self-pay | Admitting: *Deleted

## 2013-09-20 MED ORDER — NIACIN ER (ANTIHYPERLIPIDEMIC) 1000 MG PO TBCR: 1000.0000 mg | EXTENDED_RELEASE_TABLET | Freq: Every day | ORAL | Status: DC

## 2013-09-21 ENCOUNTER — Telehealth: Payer: Self-pay | Admitting: Cardiology

## 2013-09-21 NOTE — Telephone Encounter (Signed)
Wife states pt has voided twice since talking with him earlier.   He reweighed a few minutes ago in the same clothing & weight is down 2 pounds.    Now 42  Wife will keep a record of weights & call if pt symptomatic or weight increases  Horton Chin RN

## 2013-09-21 NOTE — Telephone Encounter (Signed)
New message    Call patient per Larena Glassman to set up an appointment  From the hospital.      Wife stated he gain 6 lbs . No chest pain , no sob.

## 2013-09-21 NOTE — Telephone Encounter (Signed)
Pt wife calls b/c she thinks pt has gained 6 pounds overnight.  09/19/13  Discharge weight from the hospital was  144.84 09/20/13  8 am weight at home                                 134  According to wife.  Not sure if scales were correct 09/21/13  8am weight at home                                  140  (previous weights were within 140-145 range for the past 2 months)  Pt denies shortness of breath, right leg edema has not worsened, clothes do not fit tightly & belt on the same notch as before.   Pt has taken 40mg  furosemide this am & has not yet voided.  I will call pt & wife back around 1pm check on voiding status & weight. Denies any acute distress Horton Chin RN

## 2013-09-25 ENCOUNTER — Ambulatory Visit (INDEPENDENT_AMBULATORY_CARE_PROVIDER_SITE_OTHER): Payer: Medicare Other | Admitting: Family Medicine

## 2013-09-25 ENCOUNTER — Encounter: Payer: Self-pay | Admitting: Family Medicine

## 2013-09-25 VITALS — BP 129/67 | HR 65 | Temp 97.2°F | Ht 68.0 in | Wt 137.8 lb

## 2013-09-25 DIAGNOSIS — Z952 Presence of prosthetic heart valve: Secondary | ICD-10-CM

## 2013-09-25 DIAGNOSIS — Z953 Presence of xenogenic heart valve: Secondary | ICD-10-CM

## 2013-09-25 DIAGNOSIS — I4949 Other premature depolarization: Secondary | ICD-10-CM

## 2013-09-25 DIAGNOSIS — D696 Thrombocytopenia, unspecified: Secondary | ICD-10-CM

## 2013-09-25 DIAGNOSIS — I679 Cerebrovascular disease, unspecified: Secondary | ICD-10-CM

## 2013-09-25 DIAGNOSIS — I10 Essential (primary) hypertension: Secondary | ICD-10-CM

## 2013-09-25 DIAGNOSIS — E785 Hyperlipidemia, unspecified: Secondary | ICD-10-CM

## 2013-09-25 LAB — POCT CBC
Granulocyte percent: 77.1 %G (ref 37–80)
HCT, POC: 40.3 % — AB (ref 43.5–53.7)
Hemoglobin: 12.8 g/dL — AB (ref 14.1–18.1)
Lymph, poc: 1.3 (ref 0.6–3.4)
MCH, POC: 30.4 pg (ref 27–31.2)
MCHC: 31.7 g/dL — AB (ref 31.8–35.4)
MCV: 96 fL (ref 80–97)
MPV: 6.4 fL (ref 0–99.8)
POC Granulocyte: 5.6 (ref 2–6.9)
POC LYMPH PERCENT: 17.6 %L (ref 10–50)
Platelet Count, POC: 250 10*3/uL (ref 142–424)
RBC: 4.2 M/uL — AB (ref 4.69–6.13)
RDW, POC: 16.5 %
WBC: 7.2 10*3/uL (ref 4.6–10.2)

## 2013-09-25 NOTE — Progress Notes (Signed)
Patient ID: [name], male   DOB: 02-25-1930, 80 y.o.   MRN: [id number] SUBJECTIVE: CC: Chief Complaint  Patient presents with  . Hospitalization Follow-up    had aortic valve replaced     HPI: Here for follow up on his aortic replacement surgery. Feels good and doing well.  no fever. Wound is healing. No SOB, no chest pain, No pedal edema.  Past Medical History  Diagnosis Date  . Cerebrovascular disease, unspecified   . Aortic valve disorders     stenosis  . HLD (hyperlipidemia)   . HTN (hypertension)   . CAD (coronary artery disease)   . Pneumonia July 23, 2013  . HOH (hard of hearing)     wears bilateral hearing aids  . Skin cancer of face     removed from nose  . Glaucoma     Bilateral eyes  . S/P aortic valve replacement with bioprosthetic valve 09/13/2013    25 mm [facility] Ease bovine bioprosthetic tissue valve   Past Surgical History  Procedure Laterality Date  . Carotid endarterectomy Right   . Cardiac catheterization      no PCI  . Colonoscopy w/ polypectomy    . Cataract extraction Bilateral   . Retinal detachment surgery    . Eye surgery    . Aortic valve replacement N/A 09/13/2013    Procedure: AORTIC VALVE REPLACEMENT (AVR);  Surgeon: Rexene Alberts, MD;  Location: [facility];  Service: Open Heart Surgery;  Laterality: N/A;  . Intraoperative transesophageal echocardiogram N/A 09/13/2013    Procedure: INTRAOPERATIVE TRANSESOPHAGEAL ECHOCARDIOGRAM;  Surgeon: Rexene Alberts, MD;  Location: [facility];  Service: Open Heart Surgery;  Laterality: N/A;   History   Social History  . Marital Status: Married    Spouse Name: N/A    Number of Children: N/A  . Years of Education: N/A   Occupational History  . Not on file.   Social History Main Topics  . Smoking status: Never Smoker   . Smokeless tobacco: Not on file     Comment: tobacco use - no  . Alcohol Use: No  . Drug Use: No  . Sexual Activity: Not on file   Other Topics Concern  . Not on file    Social History Narrative   Married.    Family History  Problem Relation Age of Onset  . Colon cancer Father   . Heart failure Mother     CHF    Current Outpatient Prescriptions on File Prior to Visit  Medication Sig Dispense Refill  . amiodarone (PACERONE) 200 MG tablet Take 1 tablet (200 mg total) by mouth daily.  30 tablet  1  . aspirin 325 MG EC tablet Take 325 mg by mouth daily.        . dorzolamide-timolol (COSOPT) 22.3-6.8 MG/ML ophthalmic solution Place 1 drop into both eyes 2 (two) times daily.       Marland Kitchen ezetimibe (ZETIA) 10 MG tablet Take 10 mg by mouth daily.      Marland Kitchen guaiFENesin (MUCINEX) 600 MG 12 hr tablet Take 600 mg by mouth 2 (two) times daily as needed for cough or to loosen phlegm.       . metoprolol (LOPRESSOR) 50 MG tablet Take 1 tablet (50 mg total) by mouth 2 (two) times daily.  60 tablet  1  . niacin (NIASPAN) 1000 MG CR tablet Take 1 tablet (1,000 mg total) by mouth at bedtime.  90 tablet  0  . ramipril (ALTACE) 10  MG capsule Take 1 capsule (10 mg total) by mouth 2 (two) times daily.  60 capsule  1  . traMADol (ULTRAM) 50 MG tablet Take 1 tablet (50 mg total) by mouth every 6 (six) hours as needed for moderate pain.  30 tablet  0  . Travoprost, BAK Free, (TRAVATAN) 0.004 % SOLN ophthalmic solution Place 1 drop into both eyes at bedtime.      . furosemide (LASIX) 40 MG tablet Take 1 tablet (40 mg total) by mouth daily. For 5 days then stop.  5 tablet  0  . potassium chloride SA (K-DUR,KLOR-CON) 20 MEQ tablet Take 1 tablet (20 mEq total) by mouth daily. For 5 days then stop.  5 tablet  0   No current facility-administered medications on file prior to visit.   Allergies  Allergen Reactions  . Levaquin [Levofloxacin]     RASH   Immunization History  Administered Date(s) Administered  . Influenza,inj,Quad PF,36+ Mos 05/03/2013  . Pneumococcal Polysaccharide-23 07/27/2013   Prior to Admission medications   Medication Sig Start Date End Date Taking? Authorizing  Provider  amiodarone (PACERONE) 200 MG tablet Take 1 tablet (200 mg total) by mouth daily. 09/19/13  Yes Donielle Liston Alba, PA-C  aspirin 325 MG EC tablet Take 325 mg by mouth daily.     Yes Historical Provider, MD  dorzolamide-timolol (COSOPT) 22.3-6.8 MG/ML ophthalmic solution Place 1 drop into both eyes 2 (two) times daily.  06/26/13  Yes Historical Provider, MD  ezetimibe (ZETIA) 10 MG tablet Take 10 mg by mouth daily.   Yes Historical Provider, MD  guaiFENesin (MUCINEX) 600 MG 12 hr tablet Take 600 mg by mouth 2 (two) times daily as needed for cough or to loosen phlegm.    Yes Historical Provider, MD  metoprolol (LOPRESSOR) 50 MG tablet Take 1 tablet (50 mg total) by mouth 2 (two) times daily. 09/19/13  Yes Donielle Liston Alba, PA-C  niacin (NIASPAN) 1000 MG CR tablet Take 1 tablet (1,000 mg total) by mouth at bedtime. 09/20/13  Yes Lelon Perla, MD  ramipril (ALTACE) 10 MG capsule Take 1 capsule (10 mg total) by mouth 2 (two) times daily. 09/19/13  Yes Donielle Liston Alba, PA-C  traMADol (ULTRAM) 50 MG tablet Take 1 tablet (50 mg total) by mouth every 6 (six) hours as needed for moderate pain. 09/19/13  Yes Donielle Liston Alba, PA-C  Travoprost, BAK Free, (TRAVATAN) 0.004 % SOLN ophthalmic solution Place 1 drop into both eyes at bedtime.   Yes Historical Provider, MD  furosemide (LASIX) 40 MG tablet Take 1 tablet (40 mg total) by mouth daily. For 5 days then stop. 09/19/13   Donielle Liston Alba, PA-C  levofloxacin (LEVAQUIN) 750 MG tablet  07/27/13   Historical Provider, MD  potassium chloride SA (K-DUR,KLOR-CON) 20 MEQ tablet Take 1 tablet (20 mEq total) by mouth daily. For 5 days then stop. 09/19/13   Donielle Liston Alba, PA-C     ROS: As above in the HPI. All other systems are stable or negative.  OBJECTIVE: APPEARANCE:  Patient in no acute distress.The patient appeared well nourished and normally developed. Acyanotic. Waist: VITAL SIGNS:BP 129/67  Pulse 65  Temp(Src) 97.2 F  (36.2 C) (Oral)  Ht 5' 8"  (1.727 m)  Wt 137 lb 12.8 oz (62.506 kg)  BMI 20.96 kg/m2  Thin WM  SKIN: warm and  Dry without overt rashes, tattoos and scars  HEAD and Neck: without JVD, Head and scalp: normal Eyes:No scleral icterus. Fundi normal, eye movements  normal. Ears: Auricle normal, canal normal, Tympanic membranes normal, insufflation normal. Nose: normal Throat: normal Neck & thyroid: normal  CHEST & LUNGS: Chest wall: sternotomy scar healing nicely. Lungs: Clear  CVS: Reveals the PMI to be normally located. Regular rhythm,  The replaced aortic valve is heard prominently. ,  Soft systolic murmur heard at the Aortic vave. no rubs or gallops. Peripheral vasculature: Radial pulses: normal Dorsal pedis pulses: normal Posterior pulses: normal  ABDOMEN:  Appearance: normal Benign, no organomegaly, no masses, no Abdominal Aortic enlargement. No Guarding , no rebound. No Bruits. Bowel sounds: normal  RECTAL: N/A GU: N/A  EXTREMETIES: nonedematous.  NEUROLOGIC: oriented to time,place and person; nonfocal.  Results for orders placed in visit on 09/25/13  POCT CBC      Result Value Ref Range   WBC 7.2  4.6 - 10.2 K/uL   Lymph, poc 1.3  0.6 - 3.4   POC LYMPH PERCENT 17.6  10 - 50 %L   POC Granulocyte 5.6  2 - 6.9   Granulocyte percent 77.1  37 - 80 %G   RBC 4.2 (*) 4.69 - 6.13 M/uL   Hemoglobin 12.8 (*) 14.1 - 18.1 g/dL   HCT, POC 40.3 (*) 43.5 - 53.7 %   MCV 96.0  80 - 97 fL   MCH, POC 30.4  27 - 31.2 pg   MCHC 31.7 (*) 31.8 - 35.4 g/dL   RDW, POC 16.5     Platelet Count, POC 250.0  142 - 424 K/uL   MPV 6.4  0 - 99.8 fL    ASSESSMENT: S/P aortic valve replacement with bioprosthetic valve - Plan: POCT CBC, BMP8+EGFR  Thrombocytopenia - Plan: POCT CBC  PREMATURE VENTRICULAR CONTRACTIONS  HYPERLIPIDEMIA  HYPERTENSION - Plan: BMP8+EGFR  CEREBROVASCULAR DISEASE  PLAN:  Keep appointment with the cardiovascular surgeon and his cardiologist.    Orders Placed This Encounter  Procedures  . BMP8+EGFR  . POCT CBC   Meds ordered this encounter  Medications  . levofloxacin (LEVAQUIN) 750 MG tablet    Sig:    There are no discontinued medications. Return for Recheck medical problems.  Inessa Wardrop P. Jacelyn Grip, M.D.

## 2013-09-26 LAB — BMP8+EGFR
BUN/Creatinine Ratio: 21 (ref 10–22)
BUN: 21 mg/dL (ref 8–27)
CO2: 27 mmol/L (ref 18–29)
Calcium: 9.1 mg/dL (ref 8.6–10.2)
Chloride: 99 mmol/L (ref 97–108)
Creatinine, Ser: 1.01 mg/dL (ref 0.76–1.27)
GFR calc Af Amer: 80 mL/min/{1.73_m2} (ref >59)
GFR calc non Af Amer: 69 mL/min/{1.73_m2} (ref >59)
Glucose: 110 mg/dL — ABNORMAL HIGH (ref 65–99)
Potassium: 4 mmol/L (ref 3.5–5.2)
Sodium: 141 mmol/L (ref 134–144)

## 2013-10-03 ENCOUNTER — Other Ambulatory Visit: Payer: Self-pay | Admitting: *Deleted

## 2013-10-03 ENCOUNTER — Ambulatory Visit (INDEPENDENT_AMBULATORY_CARE_PROVIDER_SITE_OTHER): Payer: Medicare Other | Admitting: Physician Assistant

## 2013-10-03 ENCOUNTER — Encounter: Payer: Self-pay | Admitting: Physician Assistant

## 2013-10-03 VITALS — BP 144/72 | HR 63 | Ht 68.0 in | Wt 139.0 lb

## 2013-10-03 DIAGNOSIS — Z954 Presence of other heart-valve replacement: Secondary | ICD-10-CM

## 2013-10-03 DIAGNOSIS — I359 Nonrheumatic aortic valve disorder, unspecified: Secondary | ICD-10-CM

## 2013-10-03 DIAGNOSIS — I679 Cerebrovascular disease, unspecified: Secondary | ICD-10-CM

## 2013-10-03 DIAGNOSIS — I10 Essential (primary) hypertension: Secondary | ICD-10-CM

## 2013-10-03 DIAGNOSIS — I2510 Atherosclerotic heart disease of native coronary artery without angina pectoris: Secondary | ICD-10-CM

## 2013-10-03 DIAGNOSIS — E785 Hyperlipidemia, unspecified: Secondary | ICD-10-CM

## 2013-10-03 DIAGNOSIS — Z952 Presence of prosthetic heart valve: Secondary | ICD-10-CM

## 2013-10-03 NOTE — Patient Instructions (Signed)
NO CHANGES WERE MADE TODAY  Your physician has requested that you have an echocardiogram 10/16/13 @ 1 PM. Echocardiography is a painless test that uses sound waves to create images of your heart. It provides your doctor with information about the size and shape of your heart and how well your heart's chambers and valves are working. This procedure takes approximately one hour. There are no restrictions for this procedure.  MAKE SURE TO KEEP YOUR APPT WITH DR. CRENSHAW 11/16/13

## 2013-10-03 NOTE — Progress Notes (Signed)
[facility] [place], [place]  [address] Phone: [phone] Fax:  [phone]  Date:  10/03/2013   ID:  BRANCE DARTT, DOB [birth year], MRN [id number]  PCP:  Redge Gainer, MD  Cardiologist:  Dr. Kirk Ruths     History of Present Illness: [name] is a 80 y.o. male with a hx of carotid stenosis s/p CEA, HTN, HL, aortic stenosis.  He is intol to statins.  He was admitted in 06/2013 with pneumonia.  Troponin was elevated.  CT was neg for PE.  EF was normal by echo with severe AS (mean 52).  LHC demonstrated non-obstructive CAD.  After recovery from his pneumonia, he was referred for AVR.    He was admitted 3/18-3/24. He underwent bioprosthetic aortic valve replacement by Dr. Roxy Manns. Postoperative course was complicated by atrial fibrillation. He converted to NSR on amiodarone. Blood pressure medicines were adjusted for hypertension.  Since discharge, he has been doing well. He denies significant dyspnea. His chest remains somewhat sore. He denies orthopnea, PND or edema. He denies syncope.   Studies:  - LHC (07/26/13):  Left Main calcified, proximal mid and distal LAD 50, circumflex and RCA patent, EF 60%.  - Echo (07/24/13):  EF 56-38%, grade 1 diastolic dysfunction, severe aortic stenosis (mean gradient 52 mmHg)   - Carotid US (09/11/13):  No significant ICA stenosis.   Recent Labs: 07/23/2013: TSH 6.809*  09/11/2013: ALT 14  09/25/2013: Creatinine 1.01; Hemoglobin 12.8*; Potassium 4.0   Wt Readings from Last 3 Encounters:  10/03/13 139 lb (63.05 kg)  09/25/13 137 lb 12.8 oz (62.506 kg)  09/19/13 144 lb 13.5 oz (65.7 kg)     Past Medical History  Diagnosis Date  . Cerebrovascular disease, unspecified   . Aortic valve disorders     stenosis  . HLD (hyperlipidemia)   . HTN (hypertension)   . CAD (coronary artery disease)   . Pneumonia July 23, 2013  . HOH (hard of hearing)     wears bilateral hearing aids  . Skin cancer of face     removed from nose  .  Glaucoma     Bilateral eyes  . S/P aortic valve replacement with bioprosthetic valve 09/13/2013    25 mm [facility] Ease bovine bioprosthetic tissue valve    Current Outpatient Prescriptions  Medication Sig Dispense Refill  . amiodarone (PACERONE) 200 MG tablet Take 1 tablet (200 mg total) by mouth daily.  30 tablet  1  . aspirin 325 MG EC tablet Take 325 mg by mouth daily.        . dorzolamide-timolol (COSOPT) 22.3-6.8 MG/ML ophthalmic solution Place 1 drop into both eyes 2 (two) times daily.       Marland Kitchen ezetimibe (ZETIA) 10 MG tablet Take 10 mg by mouth daily.      Marland Kitchen guaiFENesin (MUCINEX) 600 MG 12 hr tablet Take 600 mg by mouth 2 (two) times daily as needed for cough or to loosen phlegm.       Marland Kitchen levofloxacin (LEVAQUIN) 750 MG tablet       . metoprolol (LOPRESSOR) 50 MG tablet Take 1 tablet (50 mg total) by mouth 2 (two) times daily.  60 tablet  1  . niacin (NIASPAN) 1000 MG CR tablet Take 1 tablet (1,000 mg total) by mouth at bedtime.  90 tablet  0  . ramipril (ALTACE) 10 MG capsule Take 1 capsule (10 mg total) by mouth 2 (two) times daily.  60 capsule  1  .  traMADol (ULTRAM) 50 MG tablet Take 1 tablet (50 mg total) by mouth every 6 (six) hours as needed for moderate pain.  30 tablet  0  . Travoprost, BAK Free, (TRAVATAN) 0.004 % SOLN ophthalmic solution Place 1 drop into both eyes at bedtime.       No current facility-administered medications for this visit.    Allergies:   Levaquin   Social History:  The patient  reports that he has never smoked. He does not have any smokeless tobacco history on file. He reports that he does not drink alcohol or use illicit drugs.   Family History:  The patient's family history includes Colon cancer in his father; Heart failure in his mother.   ROS:  Please see the history of present illness.      All other systems reviewed and negative.   PHYSICAL EXAM: VS:  BP 144/72  Pulse 63  Ht 5\' 8"  (1.727 m)  Wt 139 lb (63.05 kg)  BMI 21.14 kg/m2 Well  nourished, well developed, in no acute distress HEENT: normal Neck: no JVD Cardiac:  normal S1, S2; RRR; no murmur Lungs:  clear to auscultation bilaterally, no wheezing, rhonchi or rales Abd: soft, nontender, no hepatomegaly Ext: no edema Skin: warm and dry Neuro:  CNs 2-12 intact, no focal abnormalities noted  EKG:  NSR, HR 63, LBBB     ASSESSMENT AND PLAN:  1. Aortic Stenosis Status Post AVR: Patient is doing well after recent aortic valve replacement. He is not interested in formal cardiac rehabilitation. He has been encouraged to continue to increase his walking on his own. He understands the importance of SBE prophylaxis. We will arrange follow up echocardiogram. 2. Atrial Fibrillation: Patient is maintaining NSR on amiodarone. Continue current dose. Amiodarone will be discontinued if he remains in NSR at follow up. 3. Hypertension: Controlled. Continue current therapy. 4. Hyperlipidemia: He is intolerant to statins. Continue Zetia. 5. CAD: Nonobstructive by cardiac catheterization. No angina.  Continue aspirin, Zetia.  6. Carotid Stenosis: No significant ICA stenosis at pre-CABG Dopplers. 7. Disposition:  F/u with Dr. Kirk Ruths in 6 weeks.   Signed, Richardson Dopp, PA-C  10/03/2013 3:26 PM

## 2013-10-11 ENCOUNTER — Ambulatory Visit (INDEPENDENT_AMBULATORY_CARE_PROVIDER_SITE_OTHER): Payer: Self-pay | Admitting: Surgery

## 2013-10-11 ENCOUNTER — Encounter: Payer: Self-pay | Admitting: Surgery

## 2013-10-11 ENCOUNTER — Ambulatory Visit
Admission: RE | Admit: 2013-10-11 | Discharge: 2013-10-11 | Disposition: A | Discharge status: Home / Self Care | Payer: Medicare Other | Source: Ambulatory Visit | Attending: Surgery | Admitting: Surgery

## 2013-10-11 VITALS — BP 150/70 | HR 56 | Resp 20 | Ht 68.0 in | Wt 139.0 lb

## 2013-10-11 DIAGNOSIS — Z952 Presence of prosthetic heart valve: Secondary | ICD-10-CM

## 2013-10-11 DIAGNOSIS — I359 Nonrheumatic aortic valve disorder, unspecified: Secondary | ICD-10-CM

## 2013-10-11 DIAGNOSIS — I350 Nonrheumatic aortic (valve) stenosis: Secondary | ICD-10-CM

## 2013-10-11 DIAGNOSIS — Z954 Presence of other heart-valve replacement: Secondary | ICD-10-CM

## 2013-10-11 NOTE — Progress Notes (Signed)
      HPI:  Patient returns for routine postoperative follow-up having undergone AVR with a bovine pericardial valve by Dr. Roxy Manns on 09/13/2013. The patient's early postoperative recovery while in the hospital was notable for postoperative atrial fibrillation converted with amiodarone. Since hospital discharge the patient reports that he has been feeling well. He is ambulating for 15 minutes twice daily with no chest pain or dyspnea. He has not noticed any tachy-palpitations.   Current Outpatient Prescriptions  Medication Sig Dispense Refill  . amiodarone (PACERONE) 200 MG tablet Take 1 tablet (200 mg total) by mouth daily.  30 tablet  1  . aspirin 325 MG EC tablet Take 325 mg by mouth daily.        . dorzolamide-timolol (COSOPT) 22.3-6.8 MG/ML ophthalmic solution Place 1 drop into both eyes 2 (two) times daily.       Marland Kitchen ezetimibe (ZETIA) 10 MG tablet Take 10 mg by mouth daily.      Marland Kitchen guaiFENesin (MUCINEX) 600 MG 12 hr tablet Take 600 mg by mouth 2 (two) times daily as needed for cough or to loosen phlegm.       . metoprolol (LOPRESSOR) 50 MG tablet Take 1 tablet (50 mg total) by mouth 2 (two) times daily.  60 tablet  1  . niacin (NIASPAN) 1000 MG CR tablet Take 1 tablet (1,000 mg total) by mouth at bedtime.  90 tablet  0  . ramipril (ALTACE) 10 MG capsule Take 1 capsule (10 mg total) by mouth 2 (two) times daily.  60 capsule  1  . traMADol (ULTRAM) 50 MG tablet Take 1 tablet (50 mg total) by mouth every 6 (six) hours as needed for moderate pain.  30 tablet  0  . Travoprost, BAK Free, (TRAVATAN) 0.004 % SOLN ophthalmic solution Place 1 drop into both eyes at bedtime.       No current facility-administered medications for this visit.    Physical Exam: BP 150/70  Pulse 56  Resp 20  Ht 5\' 8"  (1.727 m)  Wt 139 lb (63.05 kg)  BMI 21.14 kg/m2  SpO2 97% He looks well. Lung exam is clear. Cardiac exam shows a regular rate and rhythm with normal heart sounds. Chest incision is healing well  and sternum is stable. There is no peripheral edema.   Diagnostic Tests:  CLINICAL DATA: Aortic valve disorder. Hoarseness.  EXAM:  CHEST 2 VIEW  COMPARISON: 09/16/2013  FINDINGS:  Chronic elevation of left hemidiaphragm again demonstrated.  Previously seen pleural effusions have resolved. Both lungs are  clear. Heart size is stable. Patient has undergone previous median  sternotomy aortic valve replacement.  IMPRESSION:  Chronic elevation of left hemidiaphragm. No active cardiopulmonary  disease.  Electronically Signed  By: Earle Gell M.D.  On: 10/11/2013 14:03   Impression:  Overall I think he is doing well. I encouraged him to continue walking. He does not want to participate in cardiac rehab but said he can walk on his own. He should not lift anything heavier than 10 lbs for three months postop. He no longer drives due to vision loss from glaucoma. I told him to discontinue the amiodarone after his current prescription runs out. I discussed everything with the patient and his wife and answered their questions.  Plan:  He has a follow-up appt with Dr. Stanford Breed in May and will contact our office if he develops any problems with his incision.

## 2013-10-16 ENCOUNTER — Ambulatory Visit (HOSPITAL_COMMUNITY): Payer: Medicare Other | Attending: Cardiology | Admitting: Radiology

## 2013-10-16 ENCOUNTER — Encounter: Payer: Self-pay | Admitting: Physician Assistant

## 2013-10-16 DIAGNOSIS — Z952 Presence of prosthetic heart valve: Secondary | ICD-10-CM

## 2013-10-16 DIAGNOSIS — I4891 Unspecified atrial fibrillation: Secondary | ICD-10-CM

## 2013-10-16 DIAGNOSIS — I2510 Atherosclerotic heart disease of native coronary artery without angina pectoris: Secondary | ICD-10-CM

## 2013-10-16 DIAGNOSIS — Z954 Presence of other heart-valve replacement: Secondary | ICD-10-CM

## 2013-10-16 DIAGNOSIS — I359 Nonrheumatic aortic valve disorder, unspecified: Secondary | ICD-10-CM | POA: Insufficient documentation

## 2013-10-16 NOTE — Progress Notes (Signed)
Echocardiogram performed.  

## 2013-11-15 ENCOUNTER — Other Ambulatory Visit: Payer: Self-pay | Admitting: Physician Assistant

## 2013-11-16 ENCOUNTER — Ambulatory Visit (INDEPENDENT_AMBULATORY_CARE_PROVIDER_SITE_OTHER): Payer: Medicare Other | Admitting: Cardiology

## 2013-11-16 ENCOUNTER — Encounter: Payer: Self-pay | Admitting: Cardiology

## 2013-11-16 VITALS — BP 124/62 | HR 53 | Ht 68.0 in | Wt 137.0 lb

## 2013-11-16 DIAGNOSIS — Z953 Presence of xenogenic heart valve: Secondary | ICD-10-CM

## 2013-11-16 DIAGNOSIS — I10 Essential (primary) hypertension: Secondary | ICD-10-CM

## 2013-11-16 DIAGNOSIS — Z952 Presence of prosthetic heart valve: Secondary | ICD-10-CM

## 2013-11-16 DIAGNOSIS — E785 Hyperlipidemia, unspecified: Secondary | ICD-10-CM

## 2013-11-16 DIAGNOSIS — I679 Cerebrovascular disease, unspecified: Secondary | ICD-10-CM

## 2013-11-16 DIAGNOSIS — I2510 Atherosclerotic heart disease of native coronary artery without angina pectoris: Secondary | ICD-10-CM

## 2013-11-16 NOTE — Assessment & Plan Note (Signed)
Continue aspirin 

## 2013-11-16 NOTE — Assessment & Plan Note (Signed)
Continue zetia.  Intolerant to statins.  

## 2013-11-16 NOTE — Patient Instructions (Signed)
Your physician wants you to follow-up in: 6 MONTHS WITH DR CRENSHAW You will receive a reminder letter in the mail two months in advance. If you don't receive a letter, please call our office to schedule the follow-up appointment.  

## 2013-11-16 NOTE — Assessment & Plan Note (Signed)
Continue SBE prophylaxis. 

## 2013-11-16 NOTE — Assessment & Plan Note (Signed)
Continue aspirin. Followup carotid Dopplers March 2016. 

## 2013-11-16 NOTE — Progress Notes (Signed)
HPI: FU AVR (h/o AS); hx of carotid stenosis s/p CEA, HTN, HL. He is intol to statins. Echo 1/15 with normal LV function and severe AS (mean 52). LHC demonstrated non-obstructive CAD. Had AVR 3/15. Postoperative course was complicated by atrial fibrillation. He converted to NSR on amiodarone. Studies:  - LHC (07/26/13): Left Main calcified, proximal mid and distal LAD 50, circumflex and RCA patent, EF 60%.  - Echo (07/24/13): EF 16-10%, grade 1 diastolic dysfunction, severe aortic stenosis (mean gradient 52 mmHg)  - Carotid US (09/11/13): No significant ICA stenosis. Echocardiogram postoperatively in April of 2015 showed normal LV function, grade 1 diastolic dysfunction, bioprosthetic aortic valve with a mean gradient of 9 mm of mercury. There was mild mitral regurgitation and mild left atrial enlargement. Since last seen, There is no dyspnea, chest pain or syncope.   Current Outpatient Prescriptions  Medication Sig Dispense Refill  . aspirin 325 MG EC tablet Take 325 mg by mouth daily.        . dorzolamide-timolol (COSOPT) 22.3-6.8 MG/ML ophthalmic solution Place 1 drop into both eyes 2 (two) times daily.       Marland Kitchen ezetimibe (ZETIA) 10 MG tablet Take 10 mg by mouth daily.      Marland Kitchen guaiFENesin (MUCINEX) 600 MG 12 hr tablet Take 600 mg by mouth 2 (two) times daily as needed for cough or to loosen phlegm.       . metoprolol (LOPRESSOR) 50 MG tablet Take 1 tablet (50 mg total) by mouth 2 (two) times daily.  60 tablet  1  . niacin (NIASPAN) 1000 MG CR tablet Take 1 tablet (1,000 mg total) by mouth at bedtime.  90 tablet  0  . ramipril (ALTACE) 10 MG capsule Take 1 capsule (10 mg total) by mouth 2 (two) times daily.  60 capsule  1  . Travoprost, BAK Free, (TRAVATAN) 0.004 % SOLN ophthalmic solution Place 1 drop into both eyes at bedtime.       No current facility-administered medications for this visit.     Past Medical History  Diagnosis Date  . Cerebrovascular disease, unspecified   .  Aortic stenosis     a. s/p tissue AVR 08/2013;  b. Echo (09/2013):  Mild LVH, EF 55-60%, no RWMA, Gr 1 DD, AVR ok (mean gradient 9 mmHg), mild MR, mild LAE, mildly reduced RVSF, mild TR, PASP 32 mmHg.  Marland Kitchen HLD (hyperlipidemia)   . HTN (hypertension)   . CAD (coronary artery disease)   . Pneumonia July 23, 2013  . HOH (hard of hearing)     wears bilateral hearing aids  . Skin cancer of face     removed from nose  . Glaucoma     Bilateral eyes  . S/P aortic valve replacement with bioprosthetic valve 09/13/2013    25 mm [facility] Ease bovine bioprosthetic tissue valve    Past Surgical History  Procedure Laterality Date  . Carotid endarterectomy Right   . Cardiac catheterization      no PCI  . Colonoscopy w/ polypectomy    . Cataract extraction Bilateral   . Retinal detachment surgery    . Eye surgery    . Aortic valve replacement N/A 09/13/2013    Procedure: AORTIC VALVE REPLACEMENT (AVR);  Surgeon: Rexene Alberts, MD;  Location: [place];  Service: Open Heart Surgery;  Laterality: N/A;  . Intraoperative transesophageal echocardiogram N/A 09/13/2013    Procedure: INTRAOPERATIVE TRANSESOPHAGEAL ECHOCARDIOGRAM;  Surgeon: Rexene Alberts, MD;  Location:  [place] [facility];  Service: Open Heart Surgery;  Laterality: N/A;    History   Social History  . Marital Status: Married    Spouse Name: N/A    Number of Children: N/A  . Years of Education: N/A   Occupational History  . Not on file.   Social History Main Topics  . Smoking status: Never Smoker   . Smokeless tobacco: Not on file     Comment: tobacco use - no  . Alcohol Use: No  . Drug Use: No  . Sexual Activity: Not on file   Other Topics Concern  . Not on file   Social History Narrative   Married.     ROS: no fevers or chills, productive cough, hemoptysis, dysphasia, odynophagia, melena, hematochezia, dysuria, hematuria, rash, seizure activity, orthopnea, PND, pedal edema, claudication. Remaining systems are negative.  Physical  Exam: Well-developed well-nourished in no acute distress.  Skin is warm and dry.  HEENT is normal.  Neck is supple.  Chest is clear to auscultation with normal expansion. Sternotomy without evidence of infection. Cardiovascular exam is regular rate and rhythm. 1/6 systolic murmur left sternal border. Abdominal exam nontender or distended. No masses palpated. Extremities show no edema. neuro grossly intact  ECG Sinus bradycardia at a rate of 53. Left bundle branch block.

## 2013-11-16 NOTE — Assessment & Plan Note (Signed)
Blood pressure controlled. Continue present medications. 

## 2013-11-17 ENCOUNTER — Other Ambulatory Visit: Payer: Self-pay | Admitting: Thoracic Surgery (Cardiothoracic Vascular Surgery)

## 2013-11-22 ENCOUNTER — Other Ambulatory Visit: Payer: Self-pay | Admitting: Thoracic Surgery (Cardiothoracic Vascular Surgery)

## 2013-11-23 ENCOUNTER — Other Ambulatory Visit: Payer: Self-pay | Admitting: Physician Assistant

## 2013-11-24 ENCOUNTER — Other Ambulatory Visit: Payer: Self-pay | Admitting: *Deleted

## 2013-11-24 ENCOUNTER — Other Ambulatory Visit: Payer: Self-pay

## 2013-11-24 MED ORDER — METOPROLOL TARTRATE 50 MG PO TABS: 50.0000 mg | ORAL_TABLET | Freq: Two times a day (BID) | ORAL | Status: DC

## 2013-11-24 MED ORDER — RAMIPRIL 10 MG PO CAPS: 10.0000 mg | ORAL_CAPSULE | Freq: Two times a day (BID) | ORAL | Status: DC

## 2013-12-22 ENCOUNTER — Other Ambulatory Visit: Payer: Self-pay

## 2013-12-22 MED ORDER — NIACIN ER (ANTIHYPERLIPIDEMIC) 1000 MG PO TBCR: 1000.0000 mg | EXTENDED_RELEASE_TABLET | Freq: Every day | ORAL | Status: DC

## 2014-01-15 ENCOUNTER — Encounter: Payer: Self-pay | Admitting: Thoracic Surgery (Cardiothoracic Vascular Surgery)

## 2014-01-15 ENCOUNTER — Ambulatory Visit (INDEPENDENT_AMBULATORY_CARE_PROVIDER_SITE_OTHER): Payer: Medicare Other | Admitting: Thoracic Surgery (Cardiothoracic Vascular Surgery)

## 2014-01-15 VITALS — BP 102/55 | HR 60 | Resp 20 | Ht 68.0 in | Wt 140.0 lb

## 2014-01-15 DIAGNOSIS — Z952 Presence of prosthetic heart valve: Secondary | ICD-10-CM

## 2014-01-15 DIAGNOSIS — Z954 Presence of other heart-valve replacement: Secondary | ICD-10-CM

## 2014-01-15 DIAGNOSIS — I359 Nonrheumatic aortic valve disorder, unspecified: Secondary | ICD-10-CM

## 2014-01-15 DIAGNOSIS — I350 Nonrheumatic aortic (valve) stenosis: Secondary | ICD-10-CM

## 2014-01-15 NOTE — Progress Notes (Signed)
[address]       [place] [address]             [phone]     CARDIOTHORACIC SURGERY OFFICE NOTE  Referring Provider is Lelon Perla, MD PCP is Redge Gainer, MD   HPI:  Patient returns for routine followup status post aortic valve replacement using a bioprosthetic tissue valve on 09/13/2013.  His early postoperative recovery was notable for the development of postoperative atrial fibrillation which resolved with amiodarone therapy.  His recovery has been otherwise uneventful, and he was seen most recently in our office on 10/11/2013. Since then he has undergone followup echocardiogram and been seen in followup by Dr. Stanford Breed on 11/16/2013.  He returns for office for routine followup today and reports that he continues to do very well. He has never had much pain in his chest and he has not required any sort of pain relievers. He denies any symptoms of shortness of breath either with activity or at rest. His appetite is good. He has no dizzy spells or palpitations.   Current Outpatient Prescriptions  Medication Sig Dispense Refill  . aspirin 325 MG EC tablet Take 325 mg by mouth daily.        . dorzolamide-timolol (COSOPT) 22.3-6.8 MG/ML ophthalmic solution Place 1 drop into both eyes 2 (two) times daily.       Marland Kitchen ezetimibe (ZETIA) 10 MG tablet Take 10 mg by mouth daily.      Marland Kitchen guaiFENesin (MUCINEX) 600 MG 12 hr tablet Take 600 mg by mouth 2 (two) times daily as needed for cough or to loosen phlegm.       . metoprolol (LOPRESSOR) 50 MG tablet Take 1 tablet (50 mg total) by mouth 2 (two) times daily.  60 tablet  5  . niacin (NIASPAN) 1000 MG CR tablet Take 1 tablet (1,000 mg total) by mouth at bedtime.  90 tablet  0  . ramipril (ALTACE) 10 MG capsule Take 1 capsule (10 mg total) by mouth 2 (two) times daily.  60 capsule  3  . Travoprost, BAK Free, (TRAVATAN) 0.004 % SOLN ophthalmic solution Place 1 drop into both eyes at bedtime.       No current  facility-administered medications for this visit.      Physical Exam:   BP 102/55  Pulse 60  Resp 20  Ht 5\' 8"  (1.727 m)  Wt 140 lb (63.504 kg)  BMI 21.29 kg/m2  SpO2 99%  General:  Well-appearing  Chest:   Clear to auscultation  CV:   Regular rate and rhythm without murmur  Incisions:  Completely healed, sternum is stable  Abdomen:  Soft and nontender  Extremities:  Warm and well-perfused  Diagnostic Tests:  Transthoracic Echocardiography  Patient: [name], [name] MR #: [id number] Study Date: 10/16/2013 Gender: M Age: 80 Height: 172.7cm Weight: 63.1kg BSA: 1.88m^2 Pt. Status: Room:  REFERRING Crenshaw, Vinie Sill, Scott REFERRING Richardson Dopp SONOGRAPHER [facility] ATTENDING Ena Dawley, M.D. PERFORMING [facility], Outpatient cc:  ------------------------------------------------------------ LV EF: 55% - 60%  ------------------------------------------------------------ Indications: V43.3, S/P AVR. Aortic stenosis 424.1.  ------------------------------------------------------------ History: PMH: Acquired from the patient and from the patient's chart. Atrial fibrillation. Coronary artery disease. Aortic valve disease. Mitral valve disease. PMH: Myocardial infarction. Risk factors: Hypertension. Dyslipidemia.  ------------------------------------------------------------ Study Conclusions  - Left ventricle: The cavity size was normal. There was mild concentric hypertrophy. Systolic function was normal. The estimated ejection fraction was in the range of 55% to  60%. Wall motion was normal; there were no regional wall motion abnormalities. Doppler parameters are consistent with abnormal left ventricular relaxation (grade 1 diastolic dysfunction). Doppler parameters are consistent with elevated ventricular end-diastolic filling pressure. - Ventricular septum: Septal motion showed paradox. - Aortic valve: A bioprosthetic aortic valve sits well  in the aortic position. The peak and mean gradients are 18 and 9 mmHg, respectively. These are normal gradients for this type of aortic valve. Valve area: 1.55cm^2(VTI). Valve area: 1.37cm^2 (Vmax). - Mitral valve: Calcified annulus. Mildly thickened leaflets . Transvalvular velocity was within the normal range. There was no evidence for stenosis. Mild regurgitation. Valve area by pressure half-time: 1.83cm^2. - Left atrium: The atrium was mildly dilated. - Right ventricle: The cavity size was normal. Wall thickness was normal. Systolic function was mildly reduced. - Tricuspid valve: Mild regurgitation. - Pulmonary arteries: Systolic pressure was within the normal range. PA peak pressure: 41mm Hg (S). - Pericardium, extracardiac: There was no pericardial effusion.  ------------------------------------------------------------ Labs, prior tests, procedures, and surgery: Echocardiography (January 2015). The aortic valve showed severe stenosis. EF was 65%. Mitral valve: peak gradient of 25mm Hg. Aortic valve: peak gradient of 86mm Hg.  Valve surgery (January 2015). Aortic valve replacement with a bioprosthetic valve. Transthoracic echocardiography. M-mode, complete 2D, spectral Doppler, and color Doppler. Height: Height: 172.7cm. Height: 68in. Weight: Weight: 63.1kg. Weight: 138.7lb. Body mass index: BMI: 21.1kg/m^2. Body surface area: BSA: 1.71m^2. Blood pressure: 144/72. Patient status: Outpatient. Location: [facility]  ------------------------------------------------------------  ------------------------------------------------------------ Left ventricle: The cavity size was normal. There was mild concentric hypertrophy. Systolic function was normal. The estimated ejection fraction was in the range of 55% to 60%. Wall motion was normal; there were no regional wall motion abnormalities. Doppler parameters are consistent with abnormal left ventricular relaxation (grade 1  diastolic dysfunction). Doppler parameters are consistent with elevated ventricular end-diastolic filling pressure.  ------------------------------------------------------------ Aortic valve: A bioprosthetic aortic valve sits well in the aortic position. The peak and mean gradients are 18 and 9 mmHg, respectively. These are normal gradients for this type of aortic valve. Doppler: Transvalvular velocity was within the normal range. There was no stenosis. No regurgitation. VTI ratio of LVOT to aortic valve: 0.49. Valve area: 1.55cm^2(VTI). Indexed valve area: 0.89cm^2/m^2 (VTI). Peak velocity ratio of LVOT to aortic valve: 0.44. Valve area: 1.37cm^2 (Vmax). Indexed valve area: 0.78cm^2/m^2 (Vmax). Mean gradient: 79mm Hg (S). Peak gradient: 28mm Hg (S).  ------------------------------------------------------------ Aorta: Aortic root: The aortic root was normal in size.  ------------------------------------------------------------ Mitral valve: Calcified annulus. Mildly thickened leaflets . Mobility was not restricted. Doppler: Transvalvular velocity was within the normal range. There was no evidence for stenosis. Mild regurgitation. Valve area by pressure half-time: 1.83cm^2. Indexed valve area by pressure half-time: 1.05cm^2/m^2. Mean gradient: 17mm Hg (D). Peak gradient: 23mm Hg (D).  ------------------------------------------------------------ Left atrium: The atrium was mildly dilated.  ------------------------------------------------------------ Right ventricle: The cavity size was normal. Wall thickness was normal. Systolic function was mildly reduced.  ------------------------------------------------------------ Ventricular septum: Septal motion showed paradox.  ------------------------------------------------------------ Pulmonic valve: Structurally normal valve. Doppler: Transvalvular velocity was within the normal range. There was no evidence for  stenosis.  ------------------------------------------------------------ Tricuspid valve: Structurally normal valve. Doppler: Transvalvular velocity was within the normal range. Mild regurgitation.  ------------------------------------------------------------ Pulmonary artery: The main pulmonary artery was normal-sized. Systolic pressure was within the normal range.  ------------------------------------------------------------ Right atrium: The atrium was normal in size.  ------------------------------------------------------------ Pericardium: There was no pericardial effusion.  ------------------------------------------------------------ Systemic veins: Inferior vena cava: The vessel was normal  in size.  ------------------------------------------------------------  2D measurements Normal Doppler measurements Normal Left ventricle Main pulmonary LVID ED, 36.9 mm 43-52 artery chord, Pressure, 32 mm Hg =30 PLAX S LVID ES, 27.6 mm 23-38 Left ventricle chord, Ea, lat 4.5 cm/s ------ PLAX ann, tiss FS, chord, 25 % >29 DP PLAX E/Ea, lat 24 ------ LVPW, ED 12.9 mm ------ ann, tiss IVS/LVPW 0.86 <1.3 DP ratio, ED Ea, med 5.26 cm/s ------ Vol ED, 102 ml ------ ann, tiss MOD1 DP Vol ES, 51 ml ------ E/Ea, med 20.5 ------ MOD1 ann, tiss 3 EF, MOD1 50 % ------ DP Vol index, 58 ml/m^2 ------ LVOT ED, MOD1 Peak vel, 91.4 cm/s ------ Vol index, 29 ml/m^2 ------ S ES, MOD1 VTI, S 20.9 cm ------ Vol ED, 100 ml ------ Stroke vol 65.7 ml ------ MOD2 Stroke 37.5 ml/m^2 ------ Vol ES, 37 ml ------ index MOD2 Aortic valve EF, MOD2 63 % ------ Peak vel, 210 cm/s ------ Stroke 63 ml ------ S vol, MOD2 Mean vel, 140 cm/s ------ Vol index, 57 ml/m^2 ------ S ED, MOD2 VTI, S 42.3 cm ------ Vol index, 21 ml/m^2 ------ Mean 9 mm Hg ------ ES, MOD2 gradient, Stroke 36 ml/m^2 ------ S index, Peak 18 mm Hg ------ MOD2 gradient, Ventricular septum S IVS, ED 11.1 mm ------ VTI ratio 0.49  ------ LVOT LVOT/AV Diam, S 20 mm ------ Area, VTI 1.55 cm^2 ------ Area 3.14 cm^2 ------ Area index 0.89 cm^2/m ------ Diam 20 mm ------ (VTI) ^2 Aorta Peak vel 0.44 ------ Root diam, 30 mm ------ ratio, ED LVOT/AV Left atrium Area, Vmax 1.37 cm^2 ------ AP dim 43 mm ------ Area index 0.78 cm^2/m ------ AP dim 2.46 cm/m^2 <2.2 (Vmax) ^2 index Mitral valve Peak E vel 108 cm/s ------ Peak A vel 136 cm/s ------ Mean vel, 65.2 cm/s ------ D Decelerati 320 ms 150-23 on time 0 Pressure 120 ms ------ half-time Mean 2 mm Hg ------ gradient, D Peak 8 mm Hg ------ gradient, D Peak E/A 0.8 ------ ratio Area (PHT) 1.83 cm^2 ------ Area index 1.05 cm^2/m ------ (PHT) ^2 Annulus 51.1 cm ------ VTI Max regurg 449 cm/s ------ vel Tricuspid valve Regurg 265 cm/s ------ peak vel Peak RV-RA 28 mm Hg ------ gradient, S Right ventricle Sa vel, 7.68 cm/s ------ lat ann, tiss DP  ------------------------------------------------------------ Prepared and Electronically Authenticated by  Ena Dawley, M.D. 2015-04-20T15:36:[id number]    Impression:  Patient is doing very well approximately 4 months status post aortic valve replacement using a bioprosthetic tissue valve.  Plan:  The patient may return to unrestricted physical activity. He has been reminded regarding the life long need for antibiotic prophylaxis roll dental cleaning and related procedures. All of his questions been addressed. He will return for routine followup next March.  I spent in excess of 15 minutes during the conduct of this office consultation and >50% of this time involved direct face-to-face encounter with the patient for counseling and/or coordination of their care.   Valentina Gu. Roxy Manns, MD 01/15/2014 1:04 PM

## 2014-01-15 NOTE — Patient Instructions (Signed)
Patient may resume normal physical activity without any particular limitations at this time, and return to our office only as needed should any further problems or questions arise.   Endocarditis is a potentially serious infection of heart valves or inside lining of the heart.  It occurs more commonly in patients with diseased heart valves (such as patient's with aortic or mitral valve disease) and in patients who have undergone heart valve repair or replacement.  Certain surgical and dental procedures may put you at risk, such as dental cleaning, other dental procedures, or any surgery involving the respiratory, urinary, gastrointestinal tract, gallbladder or prostate gland.   To minimize your chances for develooping endocarditis, maintain good oral health and seek prompt medical attention for any infections involving the mouth, teeth, gums, skin or urinary tract.  Always notify your doctor or dentist about your underlying heart valve condition before having any invasive procedures. You will need to take antibiotics before certain procedures.

## 2014-03-19 ENCOUNTER — Other Ambulatory Visit: Payer: Self-pay | Admitting: *Deleted

## 2014-03-19 MED ORDER — RAMIPRIL 10 MG PO CAPS: 10.0000 mg | ORAL_CAPSULE | Freq: Two times a day (BID) | ORAL | Status: DC

## 2014-03-20 ENCOUNTER — Other Ambulatory Visit: Payer: Self-pay

## 2014-03-20 MED ORDER — NIACIN ER (ANTIHYPERLIPIDEMIC) 1000 MG PO TBCR: 1000.0000 mg | EXTENDED_RELEASE_TABLET | Freq: Every day | ORAL | Status: DC

## 2014-04-16 ENCOUNTER — Ambulatory Visit (INDEPENDENT_AMBULATORY_CARE_PROVIDER_SITE_OTHER): Payer: Medicare Other

## 2014-04-16 DIAGNOSIS — Z23 Encounter for immunization: Secondary | ICD-10-CM

## 2014-04-17 ENCOUNTER — Ambulatory Visit (INDEPENDENT_AMBULATORY_CARE_PROVIDER_SITE_OTHER): Payer: Medicare Other | Admitting: Cardiology

## 2014-04-17 ENCOUNTER — Encounter: Payer: Self-pay | Admitting: Cardiology

## 2014-04-17 VITALS — BP 110/60 | HR 51 | Ht 66.0 in | Wt 141.3 lb

## 2014-04-17 DIAGNOSIS — Z953 Presence of xenogenic heart valve: Secondary | ICD-10-CM

## 2014-04-17 DIAGNOSIS — I679 Cerebrovascular disease, unspecified: Secondary | ICD-10-CM

## 2014-04-17 DIAGNOSIS — I10 Essential (primary) hypertension: Secondary | ICD-10-CM

## 2014-04-17 DIAGNOSIS — I2510 Atherosclerotic heart disease of native coronary artery without angina pectoris: Secondary | ICD-10-CM

## 2014-04-17 DIAGNOSIS — Z954 Presence of other heart-valve replacement: Secondary | ICD-10-CM

## 2014-04-17 DIAGNOSIS — I2583 Coronary atherosclerosis due to lipid rich plaque: Secondary | ICD-10-CM

## 2014-04-17 DIAGNOSIS — R002 Palpitations: Secondary | ICD-10-CM

## 2014-04-17 MED ORDER — METOPROLOL TARTRATE 50 MG PO TABS: 25.0000 mg | ORAL_TABLET | Freq: Two times a day (BID) | ORAL | Status: DC

## 2014-04-17 NOTE — Assessment & Plan Note (Signed)
Continue aspirin. Followup carotid Dopplers March 2016.

## 2014-04-17 NOTE — Assessment & Plan Note (Signed)
Continue SBE prophylaxis. 

## 2014-04-17 NOTE — Progress Notes (Signed)
HPI: FU AVR (h/o AS); hx of carotid stenosis s/p CEA, HTN, HL. He is intol to statins. Echo 1/15 with normal LV function and severe AS (mean 52). LHC demonstrated non-obstructive CAD. Had AVR 3/15. Postoperative course was complicated by atrial fibrillation. He converted to NSR on amiodarone.  Studies:  - LHC (07/26/13): Left Main calcified, proximal mid and distal LAD 50, circumflex and RCA patent, EF 60%.  - Echo (07/24/13): EF 58-52%, grade 1 diastolic dysfunction, severe aortic stenosis (mean gradient 52 mmHg)  - Carotid US (09/11/13): No significant ICA stenosis.  Echocardiogram postoperatively in April of 2015 showed normal LV function, grade 1 diastolic dysfunction, bioprosthetic aortic valve with a mean gradient of 9 mm of mercury. There was mild mitral regurgitation and mild left atrial enlargement.  Since last seen, there is no dyspnea, chest pain or syncope.   Current Outpatient Prescriptions  Medication Sig Dispense Refill  . aspirin 325 MG EC tablet Take 325 mg by mouth daily.        . dorzolamide-timolol (COSOPT) 22.3-6.8 MG/ML ophthalmic solution Place 1 drop into both eyes 2 (two) times daily.       Marland Kitchen ezetimibe (ZETIA) 10 MG tablet Take 10 mg by mouth daily.      Marland Kitchen guaiFENesin (MUCINEX) 600 MG 12 hr tablet Take 600 mg by mouth 2 (two) times daily as needed for cough or to loosen phlegm.       . metoprolol (LOPRESSOR) 50 MG tablet Take 1 tablet (50 mg total) by mouth 2 (two) times daily.  60 tablet  5  . niacin (NIASPAN) 1000 MG CR tablet Take 1 tablet (1,000 mg total) by mouth at bedtime.  90 tablet  0  . ramipril (ALTACE) 10 MG capsule Take 1 capsule (10 mg total) by mouth 2 (two) times daily.  60 capsule  1  . Travoprost, BAK Free, (TRAVATAN) 0.004 % SOLN ophthalmic solution Place 1 drop into both eyes at bedtime.       No current facility-administered medications for this visit.     Past Medical History  Diagnosis Date  . Cerebrovascular disease, unspecified   .  Aortic stenosis     a. s/p tissue AVR 08/2013;  b. Echo (09/2013):  Mild LVH, EF 55-60%, no RWMA, Gr 1 DD, AVR ok (mean gradient 9 mmHg), mild MR, mild LAE, mildly reduced RVSF, mild TR, PASP 32 mmHg.  Marland Kitchen HLD (hyperlipidemia)   . HTN (hypertension)   . CAD (coronary artery disease)   . Pneumonia July 23, 2013  . HOH (hard of hearing)     wears bilateral hearing aids  . Skin cancer of face     removed from nose  . Glaucoma     Bilateral eyes  . S/P aortic valve replacement with bioprosthetic valve 09/13/2013    25 mm [facility] Ease bovine bioprosthetic tissue valve    Past Surgical History  Procedure Laterality Date  . Carotid endarterectomy Right   . Cardiac catheterization      no PCI  . Colonoscopy w/ polypectomy    . Cataract extraction Bilateral   . Retinal detachment surgery    . Eye surgery    . Aortic valve replacement N/A 09/13/2013    Procedure: AORTIC VALVE REPLACEMENT (AVR);  Surgeon: Rexene Alberts, MD;  Location: [place];  Service: Open Heart Surgery;  Laterality: N/A;  . Intraoperative transesophageal echocardiogram N/A 09/13/2013    Procedure: INTRAOPERATIVE TRANSESOPHAGEAL ECHOCARDIOGRAM;  Surgeon: Rexene Alberts,  MD;  Location: [facility];  Service: Open Heart Surgery;  Laterality: N/A;    History   Social History  . Marital Status: Married    Spouse Name: N/A    Number of Children: N/A  . Years of Education: N/A   Occupational History  . Not on file.   Social History Main Topics  . Smoking status: Never Smoker   . Smokeless tobacco: Not on file     Comment: tobacco use - no  . Alcohol Use: No  . Drug Use: No  . Sexual Activity: Not on file   Other Topics Concern  . Not on file   Social History Narrative   Married.     ROS: no fevers or chills, productive cough, hemoptysis, dysphasia, odynophagia, melena, hematochezia, dysuria, hematuria, rash, seizure activity, orthopnea, PND, pedal edema, claudication. Remaining systems are negative.  Physical  Exam: Well-developed well-nourished in no acute distress.  Skin is warm and dry.  HEENT is normal.  Neck is supple.  Chest is clear to auscultation with normal expansion.  Cardiovascular exam is regular rate and rhythm.  Abdominal exam nontender or distended. No masses palpated. Extremities show no edema. neuro grossly intact  ECG Sinus rhythm at a rate of 51. Left bundle branch block.

## 2014-04-17 NOTE — Assessment & Plan Note (Signed)
Blood pressure controlled. Continue present medications. Given mild bradycardia decrease metoprolol to 25 mg by mouth twice a day.

## 2014-04-17 NOTE — Assessment & Plan Note (Signed)
Continue aspirin. Intolerant to statins. 

## 2014-04-17 NOTE — Patient Instructions (Signed)
Your physician wants you to follow-up in: [place] will receive a reminder letter in the mail two months in advance. If you don't receive a letter, please call our office to schedule the follow-up appointment.   DECREASE METOPROLOL TO 25 MG TWICE DAILY= 1/2 OF THE 50 MG TABLET TWICE DAILY

## 2014-04-17 NOTE — Assessment & Plan Note (Signed)
Continue present medications. Intolerant to statins. 

## 2014-04-19 IMAGING — CR DG CHEST 2V
2 series · 2 of 2 positions shown · non-contrast
Comparison: CT 07/23/2013

CLINICAL DATA: Pre cardiac surgery.

EXAM:
CHEST  2 VIEW

[w chest pa]
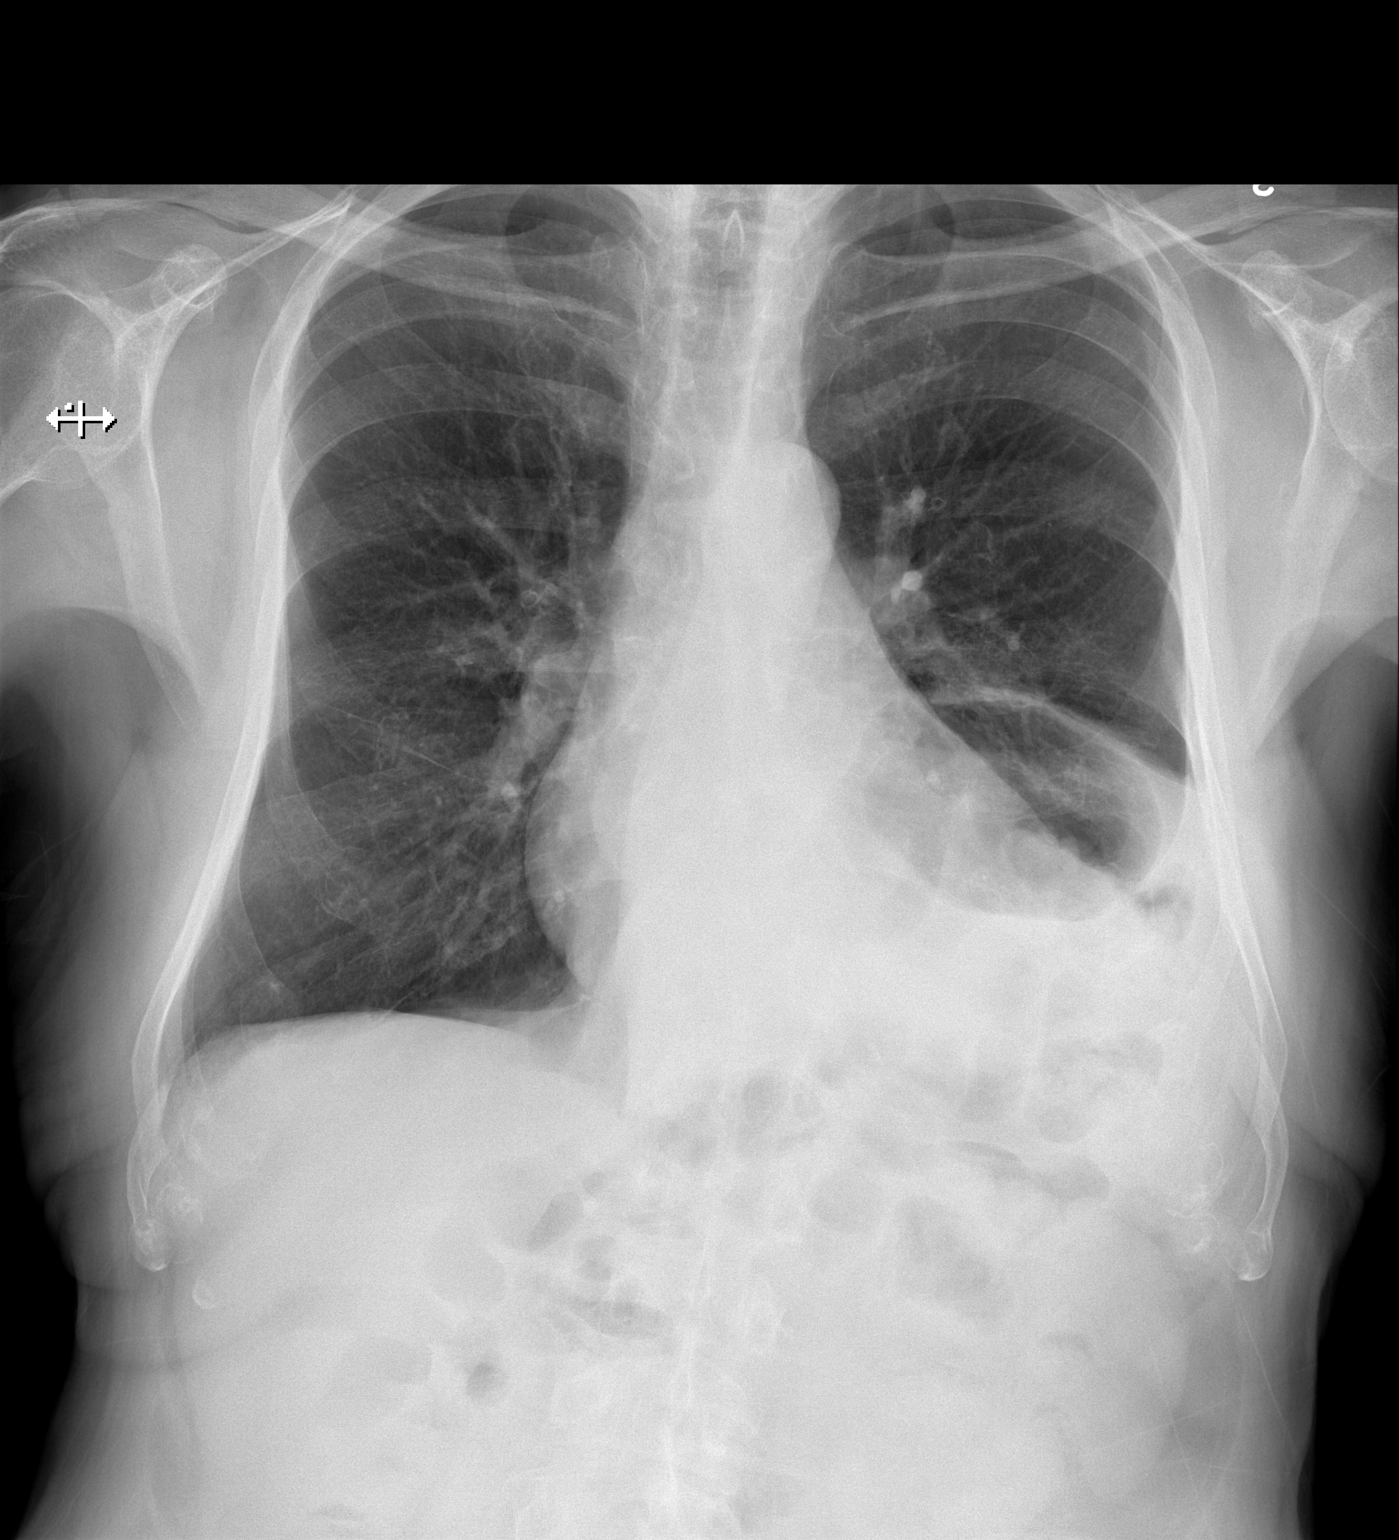

[w chest lat]
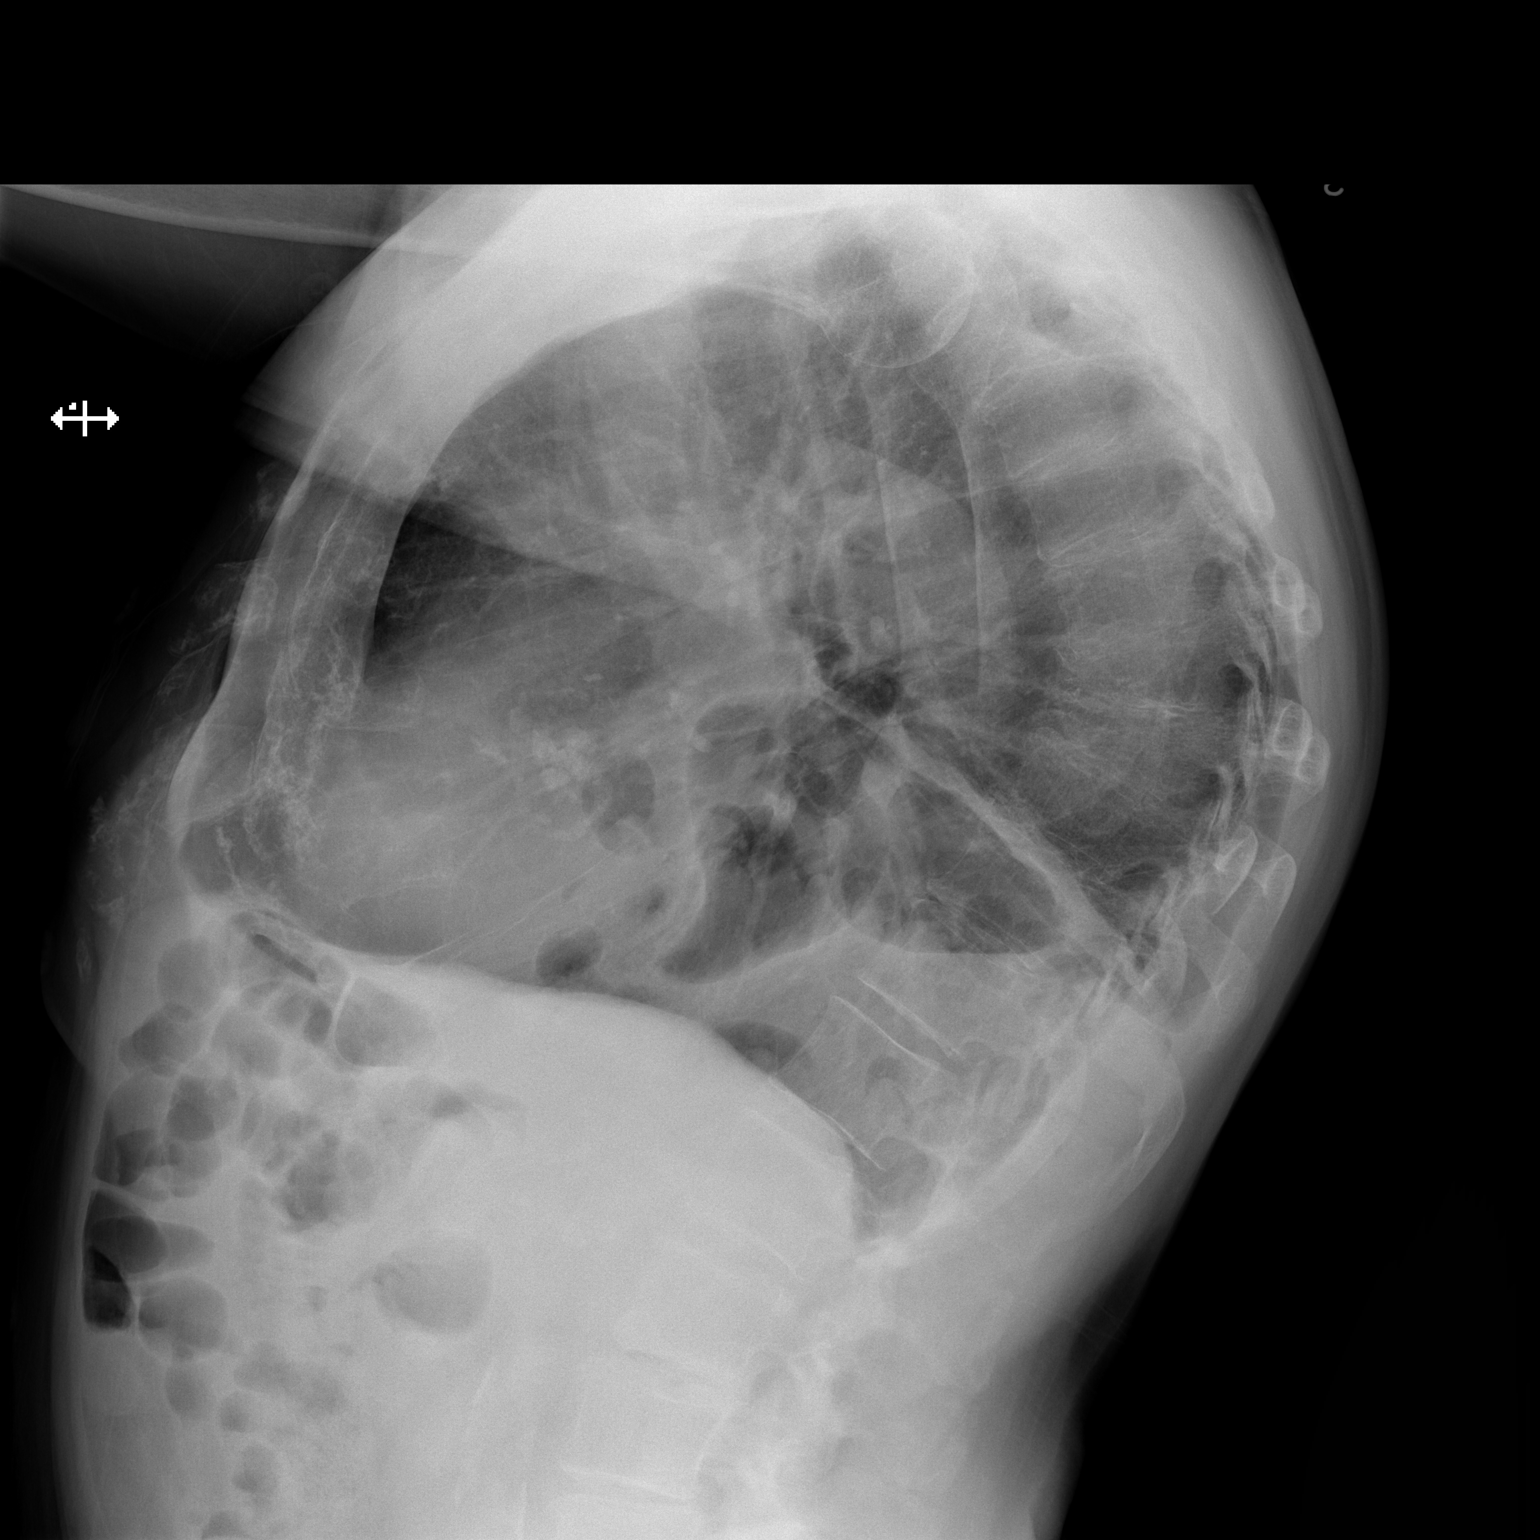

[2 of 2 positions shown; findings below may reference images not displayed]

FINDINGS: Bowel within the left lower hemi thorax again noted. When compared
to prior CT, this was shown to be related to a large diaphragmatic
hernia. This is unchanged. Right lung is clear. Mild hyperinflation
of the right lung. Heart is normal size. No effusions or acute bony
abnormality.
IMPRESSION: Large left diaphragmatic hernia.

Suspect mild COPD.

No acute findings.

## 2014-04-30 ENCOUNTER — Other Ambulatory Visit: Payer: Self-pay | Admitting: *Deleted

## 2014-04-30 DIAGNOSIS — I2510 Atherosclerotic heart disease of native coronary artery without angina pectoris: Secondary | ICD-10-CM

## 2014-04-30 DIAGNOSIS — I679 Cerebrovascular disease, unspecified: Secondary | ICD-10-CM

## 2014-04-30 DIAGNOSIS — Z953 Presence of xenogenic heart valve: Secondary | ICD-10-CM

## 2014-04-30 DIAGNOSIS — I10 Essential (primary) hypertension: Secondary | ICD-10-CM

## 2014-04-30 DIAGNOSIS — I2583 Coronary atherosclerosis due to lipid rich plaque: Secondary | ICD-10-CM

## 2014-04-30 MED ORDER — RAMIPRIL 10 MG PO CAPS: 10.0000 mg | ORAL_CAPSULE | Freq: Two times a day (BID) | ORAL | Status: DC

## 2014-04-30 MED ORDER — METOPROLOL TARTRATE 50 MG PO TABS: 25.0000 mg | ORAL_TABLET | Freq: Two times a day (BID) | ORAL | Status: DC

## 2014-06-07 ENCOUNTER — Encounter (HOSPITAL_COMMUNITY): Payer: Self-pay | Admitting: Interventional Cardiology

## 2014-07-03 ENCOUNTER — Other Ambulatory Visit: Payer: Self-pay | Admitting: Cardiology

## 2014-07-16 ENCOUNTER — Other Ambulatory Visit: Payer: Self-pay

## 2014-07-16 MED ORDER — EZETIMIBE 10 MG PO TABS: 10.0000 mg | ORAL_TABLET | Freq: Every day | ORAL | Status: DC

## 2014-09-17 ENCOUNTER — Ambulatory Visit: Payer: Medicare Other | Admitting: Thoracic Surgery (Cardiothoracic Vascular Surgery)

## 2014-09-26 ENCOUNTER — Other Ambulatory Visit: Payer: Self-pay | Admitting: Cardiology

## 2014-09-27 ENCOUNTER — Other Ambulatory Visit: Payer: Self-pay

## 2014-09-27 MED ORDER — EZETIMIBE 10 MG PO TABS: 10.0000 mg | ORAL_TABLET | Freq: Every day | ORAL | Status: DC

## 2014-09-28 ENCOUNTER — Ambulatory Visit: Payer: Self-pay | Admitting: Family Medicine

## 2014-10-05 ENCOUNTER — Encounter: Payer: Self-pay | Admitting: Thoracic Surgery (Cardiothoracic Vascular Surgery)

## 2014-10-05 ENCOUNTER — Ambulatory Visit (INDEPENDENT_AMBULATORY_CARE_PROVIDER_SITE_OTHER): Payer: Medicare Other | Admitting: Thoracic Surgery (Cardiothoracic Vascular Surgery)

## 2014-10-05 VITALS — BP 137/68 | HR 67 | Resp 16 | Ht 68.0 in | Wt 138.0 lb

## 2014-10-05 DIAGNOSIS — I350 Nonrheumatic aortic (valve) stenosis: Secondary | ICD-10-CM

## 2014-10-05 DIAGNOSIS — Z952 Presence of prosthetic heart valve: Secondary | ICD-10-CM

## 2014-10-05 DIAGNOSIS — Z953 Presence of xenogenic heart valve: Secondary | ICD-10-CM

## 2014-10-05 DIAGNOSIS — Z954 Presence of other heart-valve replacement: Secondary | ICD-10-CM

## 2014-10-05 NOTE — Patient Instructions (Signed)

## 2014-10-05 NOTE — Progress Notes (Signed)
[address]       [place] [address]             [phone]     CARDIOTHORACIC SURGERY OFFICE NOTE  Referring Provider is Lelon Perla, MD PCP is Redge Gainer, MD   HPI:  Patient returns for routine followup approximately 1 year status post aortic valve replacement using a bioprosthetic tissue valve on 09/13/2013.  He underwent follow-up transthoracic echocardiogram on 10/16/2013 that revealed normal functioning bioprosthetic tissue valve in the aortic position with a mean transvalvular gradient estimated 9 mmHg and normal left ventricular systolic function with ejection fraction estimated 55-60%.  He was last seen in our office on 01/15/2014 and since then he has been seen in follow-up by Dr. Stanford Breed on 04/17/2014. The patient continues to do very well clinically. He reports no shortness of breath with any type of activity. He denies any history of chest pain or chest tightness.  He reports normal physical activity without any significant limitations. His only real limitation at this point is poor eyesight.   He underwent laser eye surgery earlier today for glaucoma.  He otherwise remains in good health.   Current Outpatient Prescriptions  Medication Sig Dispense Refill  . aspirin 325 MG EC tablet Take 325 mg by mouth daily.      . dorzolamide-timolol (COSOPT) 22.3-6.8 MG/ML ophthalmic solution Place 1 drop into both eyes 2 (two) times daily.     Marland Kitchen ezetimibe (ZETIA) 10 MG tablet Take 1 tablet (10 mg total) by mouth daily. 30 tablet 6  . guaiFENesin (MUCINEX) 600 MG 12 hr tablet Take 600 mg by mouth 2 (two) times daily as needed for cough or to loosen phlegm.     . metoprolol (LOPRESSOR) 50 MG tablet Take 0.5 tablets (25 mg total) by mouth 2 (two) times daily. 90 tablet 3  . niacin (NIASPAN) 1000 MG CR tablet TAKE 1 TABLET BY MOUTH DAILY AT BEDTIME 90 tablet 1  . ramipril (ALTACE) 10 MG capsule Take 1 capsule (10 mg total) by mouth 2 (two) times daily. 180  capsule 3  . Travoprost, BAK Free, (TRAVATAN) 0.004 % SOLN ophthalmic solution Place 1 drop into both eyes at bedtime.     No current facility-administered medications for this visit.      Physical Exam:   BP 137/68 mmHg  Pulse 67  Resp 16  Ht 5\' 8"  (1.727 m)  Wt 138 lb (62.596 kg)  BMI 20.99 kg/m2  SpO2 99%  General:  Well-appearing  Chest:   Clear  CV:   Regular rate and rhythm without significant appreciable murmur  Incisions:  Completely healed, sternum is stable  Abdomen:  Soft and nontender  Extremities:  Warm and well-perfused  Diagnostic Tests:  Transthoracic Echocardiography  Patient:  Shameer, Molstad MR #:    [id number] Study Date: 10/16/2013 Gender:   M Age:    64 Height:   172.7cm Weight:   63.1kg BSA:    1.15m^2 Pt. Status: Room:  REFERRING  Crenshaw, Vinie Sill, Scott REFERRING  Richardson Dopp SONOGRAPHER McFatter, [facility] ATTENDING  Ena Dawley, M.D. PERFORMING  [facility], Outpatient cc:  ------------------------------------------------------------ LV EF: 55% -  60%  ------------------------------------------------------------ Indications:   V43.3, S/P AVR. Aortic stenosis 424.1.  ------------------------------------------------------------ History:  PMH: Acquired from the patient and from the patient's chart. Atrial fibrillation. Coronary artery disease. Aortic valve disease. Mitral valve disease. PMH: Myocardial infarction. Risk factors: Hypertension. Dyslipidemia.  ------------------------------------------------------------ Study Conclusions  -  Left ventricle: The cavity size was normal. There was mild concentric hypertrophy. Systolic function was normal. The estimated ejection fraction was in the range of 55% to 60%. Wall motion was normal; there were no regional wall motion abnormalities. Doppler parameters are consistent with abnormal left ventricular relaxation  (grade 1 diastolic dysfunction). Doppler parameters are consistent with elevated ventricular end-diastolic filling pressure. - Ventricular septum: Septal motion showed paradox. - Aortic valve: A bioprosthetic aortic valve sits well in the aortic position. The peak and mean gradients are 18 and 9 mmHg, respectively. These are normal gradients for this type of aortic valve. Valve area: 1.55cm^2(VTI). Valve area: 1.37cm^2 (Vmax). - Mitral valve: Calcified annulus. Mildly thickened leaflets . Transvalvular velocity was within the normal range. There was no evidence for stenosis. Mild regurgitation. Valve area by pressure half-time: 1.83cm^2. - Left atrium: The atrium was mildly dilated. - Right ventricle: The cavity size was normal. Wall thickness was normal. Systolic function was mildly reduced. - Tricuspid valve: Mild regurgitation. - Pulmonary arteries: Systolic pressure was within the normal range. PA peak pressure: 46mm Hg (S). - Pericardium, extracardiac: There was no pericardial effusion.  ------------------------------------------------------------ Labs, prior tests, procedures, and surgery: Echocardiography (January 2015).  The aortic valve showed severe stenosis. EF was 65%. Mitral valve: peak gradient of 11mm Hg. Aortic valve: peak gradient of 62mm Hg.  Valve surgery (January 2015).   Aortic valve replacement with a bioprosthetic valve. Transthoracic echocardiography. M-mode, complete 2D, spectral Doppler, and color Doppler. Height: Height: 172.7cm. Height: 68in. Weight: Weight: 63.1kg. Weight: 138.7lb. Body mass index: BMI: 21.1kg/m^2. Body surface area:  BSA: 1.12m^2. Blood pressure:   144/72. Patient status: Outpatient. Location: [place] [facility]  ------------------------------------------------------------  ------------------------------------------------------------ Left ventricle: The cavity size was normal. There  was mild concentric hypertrophy. Systolic function was normal. The estimated ejection fraction was in the range of 55% to 60%. Wall motion was normal; there were no regional wall motion abnormalities. Doppler parameters are consistent with abnormal left ventricular relaxation (grade 1 diastolic dysfunction). Doppler parameters are consistent with elevated ventricular end-diastolic filling pressure.  ------------------------------------------------------------ Aortic valve: A bioprosthetic aortic valve sits well in the aortic position. The peak and mean gradients are 18 and 9 mmHg, respectively. These are normal gradients for this type of aortic valve. Doppler: Transvalvular velocity was within the normal range. There was no stenosis. No regurgitation.  VTI ratio of LVOT to aortic valve: 0.49. Valve area: 1.55cm^2(VTI). Indexed valve area: 0.89cm^2/m^2 (VTI). Peak velocity ratio of LVOT to aortic valve: 0.44. Valve area: 1.37cm^2 (Vmax). Indexed valve area: 0.78cm^2/m^2 (Vmax).  Mean gradient: 62mm Hg (S). Peak gradient: 8mm Hg (S).  ------------------------------------------------------------ Aorta: Aortic root: The aortic root was normal in size.  ------------------------------------------------------------ Mitral valve:  Calcified annulus. Mildly thickened leaflets . Mobility was not restricted. Doppler: Transvalvular velocity was within the normal range. There was no evidence for stenosis. Mild regurgitation.  Valve area by pressure half-time: 1.83cm^2. Indexed valve area by pressure half-time: 1.05cm^2/m^2.  Mean gradient: 53mm Hg (D). Peak gradient: 50mm Hg (D).  ------------------------------------------------------------ Left atrium: The atrium was mildly dilated.  ------------------------------------------------------------ Right ventricle: The cavity size was normal. Wall thickness was normal. Systolic function was mildly  reduced.  ------------------------------------------------------------ Ventricular septum:  Septal motion showed paradox.  ------------------------------------------------------------ Pulmonic valve:  Structurally normal valve.  Doppler: Transvalvular velocity was within the normal range. There was no evidence for stenosis.  ------------------------------------------------------------ Tricuspid valve:  Structurally normal valve.  Doppler: Transvalvular velocity was within the normal range. Mild regurgitation.  ------------------------------------------------------------ Pulmonary  artery:  The main pulmonary artery was normal-sized. Systolic pressure was within the normal range.  ------------------------------------------------------------ Right atrium: The atrium was normal in size.  ------------------------------------------------------------ Pericardium: There was no pericardial effusion.  ------------------------------------------------------------ Systemic veins: Inferior vena cava: The vessel was normal in size.  ------------------------------------------------------------  2D measurements    Normal Doppler measurements  Normal Left ventricle         Main pulmonary LVID ED,  36.9 mm   43-52  artery chord,             Pressure,  32 mm Hg =30 PLAX              S LVID ES,  27.6 mm   23-38  Left ventricle chord,             Ea, lat   4.5 cm/s  ------ PLAX              ann, tiss FS, chord,  25 %   >29   DP PLAX              E/Ea, lat  24    ------ LVPW, ED  12.9 mm   ------ ann, tiss IVS/LVPW  0.86    <1.3  DP ratio, ED           Ea, med  5.26 cm/s  ------ Vol ED,   102 ml   ------ ann, tiss MOD1              DP Vol ES,   51 ml   ------ E/Ea, med 20.5    ------ MOD1              ann, tiss    3 EF, MOD1   50 %   ------ DP Vol index,  58 ml/m^2 ------ LVOT ED, MOD1            Peak vel, 91.4 cm/s  ------ Vol index,  29 ml/m^2 ------ S ES, MOD1            VTI, S   20.9 cm   ------ Vol ED,   100 ml   ------ Stroke vol 65.7 ml   ------ MOD2              Stroke   37.5 ml/m^2 ------ Vol ES,   37 ml   ------ index MOD2              Aortic valve EF, MOD2   63 %   ------ Peak vel,  210 cm/s  ------ Stroke    63 ml   ------ S vol, MOD2           Mean vel,  140 cm/s  ------ Vol index,  57 ml/m^2 ------ S ED, MOD2            VTI, S   42.3 cm   ------ Vol index,  21 ml/m^2 ------ Mean     9 mm Hg ------ ES, MOD2            gradient, Stroke    36 ml/m^2 ------ S index,             Peak     18 mm Hg ------ MOD2              gradient, Ventricular septum       S IVS, ED  11.1 mm   ------ VTI ratio 0.49    ------ LVOT  LVOT/AV Diam, S   20 mm   ------ Area, VTI 1.55 cm^2  ------ Area    3.14 cm^2  ------ Area index 0.89 cm^2/m ------ Diam     20 mm   ------ (VTI)      ^2 Aorta             Peak vel  0.44    ------ Root diam,  30 mm   ------ ratio, ED               LVOT/AV Left atrium          Area, Vmax 1.37 cm^2  ------ AP dim    43 mm   ------ Area index 0.78 cm^2/m ------ AP dim   2.46 cm/m^2 <2.2  (Vmax)     ^2 index             Mitral valve                Peak E vel 108 cm/s  ------                Peak A vel 136 cm/s  ------                Mean vel, 65.2 cm/s  ------                D                Decelerati 320 ms   150-23                on time         0                Pressure  120 ms   ------                half-time                Mean     2 mm Hg ------                gradient,                D                Peak     8 mm Hg ------                gradient,                D                Peak E/A  0.8    ------                ratio                Area (PHT) 1.83 cm^2  ------                Area index 1.05 cm^2/m ------                (PHT)      ^2                Annulus  51.1 cm   ------                VTI                Max regurg 449 cm/s  ------  vel                Tricuspid valve                Regurg   265 cm/s  ------                peak vel                Peak RV-RA  28 mm Hg ------                gradient,                S                Right ventricle                Sa vel,  7.68 cm/s  ------                lat ann,                tiss DP  ------------------------------------------------------------ Prepared and Electronically Authenticated by  Ena Dawley, M.D. 2015-04-20T15:36:[id number]   Impression:  Patient is doing very well more than one year status post aortic valve replacement using a bioprosthetic tissue valve. He denies any symptoms suggestive of chronic diastolic congestive heart failure. He reports normal physical activity without any significant limitations other than poor eyesight.  Follow-up echocardiogram performed last April revealed normal functioning bioprosthetic tissue valve and normal left ventricular  systolic function.    Plan:  In the future the patient will call and return to see [place] as needed. He has been reminded regarding the lifelong need for antibiotic prophylaxis for all dental cleaning and related procedures.  I spent in excess of 10 minutes during the conduct of this office consultation and >50% of this time involved direct face-to-face encounter with the patient for counseling and/or coordination of their care.   Valentina Gu. Roxy Manns, MD 10/05/2014 3:31 PM

## 2014-11-27 ENCOUNTER — Encounter: Payer: Self-pay | Admitting: Family Medicine

## 2014-11-27 ENCOUNTER — Ambulatory Visit (INDEPENDENT_AMBULATORY_CARE_PROVIDER_SITE_OTHER): Payer: Medicare Other | Admitting: Family Medicine

## 2014-11-27 VITALS — BP 108/59 | HR 64 | Temp 97.0°F | Ht 68.0 in | Wt 142.0 lb

## 2014-11-27 DIAGNOSIS — I359 Nonrheumatic aortic valve disorder, unspecified: Secondary | ICD-10-CM

## 2014-11-27 DIAGNOSIS — I679 Cerebrovascular disease, unspecified: Secondary | ICD-10-CM | POA: Diagnosis not present

## 2014-11-27 DIAGNOSIS — I10 Essential (primary) hypertension: Secondary | ICD-10-CM

## 2014-11-27 DIAGNOSIS — I2510 Atherosclerotic heart disease of native coronary artery without angina pectoris: Secondary | ICD-10-CM

## 2014-11-27 DIAGNOSIS — I2583 Coronary atherosclerosis due to lipid rich plaque: Secondary | ICD-10-CM

## 2014-11-27 NOTE — Progress Notes (Signed)
   Subjective:    Patient ID: [name], male    DOB: 19-Jul-1929, 81 y.o.   MRN: [id number]  HPI 81 year old gentleman who is scheduled for glaucoma surgery next week. He is one year status post aortic valve replacement and has seen cardiology in cardiovascular surgeon who give him excellent reports. He has good exercise tolerance, no shortness of breath, and denies chest pain. He had atrial fibrillation postoperatively and was treated with amiodarone but is off this medicine and in sinus rhythm.  Patient Active Problem List   Diagnosis Date Noted  . S/P aortic valve replacement with bioprosthetic valve 09/13/2013  . Thrombocytopenia 08/17/2013  . Pulsatile groin mass 08/17/2013  . CAD (coronary artery disease) 08/17/2013  . Rash and nonspecific skin eruption 07/31/2013  . Allergic dermatitis 07/31/2013  . PREMATURE VENTRICULAR CONTRACTIONS 05/13/2010  . PALPITATIONS 05/07/2010  . ARM NUMBNESS 01/04/2009  . HYPERLIPIDEMIA 01/02/2009  . Essential hypertension 01/02/2009  . AORTIC STENOSIS 01/02/2009  . Cerebrovascular disease 01/02/2009  . DYSPNEA 01/02/2009   Outpatient Encounter Prescriptions as of 11/27/2014  Medication Sig  . aspirin 325 MG EC tablet Take 325 mg by mouth daily.    . dorzolamide (TRUSOPT) 2 % ophthalmic solution   . dorzolamide-timolol (COSOPT) 22.3-6.8 MG/ML ophthalmic solution Place 1 drop into both eyes 2 (two) times daily.   Marland Kitchen ezetimibe (ZETIA) 10 MG tablet Take 1 tablet (10 mg total) by mouth daily.  Marland Kitchen guaiFENesin (MUCINEX) 600 MG 12 hr tablet Take 600 mg by mouth 2 (two) times daily as needed for cough or to loosen phlegm.   . metoprolol (LOPRESSOR) 50 MG tablet Take 0.5 tablets (25 mg total) by mouth 2 (two) times daily.  . niacin (NIASPAN) 1000 MG CR tablet TAKE 1 TABLET BY MOUTH DAILY AT BEDTIME  . ramipril (ALTACE) 10 MG capsule Take 1 capsule (10 mg total) by mouth 2 (two) times daily.  . Travoprost, BAK Free, (TRAVATAN) 0.004 % SOLN ophthalmic solution  Place 1 drop into both eyes at bedtime.   No facility-administered encounter medications on file as of 11/27/2014.      Review of Systems  Constitutional: Negative.   Respiratory: Negative.   Cardiovascular: Negative.   Gastrointestinal: Negative.   Neurological: Negative.   Psychiatric/Behavioral: Negative.        Objective:   Physical Exam  Constitutional: He is oriented to person, place, and time. He appears well-developed and well-nourished.  Cardiovascular: Normal rate.   Pulmonary/Chest: Effort normal.  Abdominal: Soft.  Musculoskeletal: Normal range of motion.  Neurological: He is alert and oriented to person, place, and time.    BP 108/59 mmHg  Pulse 64  Temp(Src) 97 F (36.1 C) (Oral)  Ht 5\' 8"  (1.727 m)  Wt 142 lb (64.411 kg)  BMI 21.60 kg/m2       Assessment & Plan:  1. Essential hypertension Blood pressure is controlled with metoprolol. He should continue this pre-and postoperatively  2. Aortic valve disorder Normal exercise tolerance and no pain  3. Cerebrovascular disease Dramatic. He takes aspirin which I suggested he stop 5 days prior to surgery  4. Coronary artery disease due to lipid rich plaque No chest pain area unable to take statins. Continue with Zetia.  Patient is to be cleared for glaucoma surgery  Wardell Honour MD

## 2015-01-23 ENCOUNTER — Ambulatory Visit (INDEPENDENT_AMBULATORY_CARE_PROVIDER_SITE_OTHER): Payer: Medicare Other | Admitting: Family Medicine

## 2015-01-23 ENCOUNTER — Encounter: Payer: Self-pay | Admitting: Family Medicine

## 2015-01-23 VITALS — BP 125/73 | HR 69 | Temp 97.7°F | Ht 68.0 in | Wt 140.6 lb

## 2015-01-23 DIAGNOSIS — R946 Abnormal results of thyroid function studies: Secondary | ICD-10-CM | POA: Diagnosis not present

## 2015-01-23 DIAGNOSIS — E039 Hypothyroidism, unspecified: Secondary | ICD-10-CM

## 2015-01-23 DIAGNOSIS — I10 Essential (primary) hypertension: Secondary | ICD-10-CM | POA: Diagnosis not present

## 2015-01-23 DIAGNOSIS — R7989 Other specified abnormal findings of blood chemistry: Secondary | ICD-10-CM

## 2015-01-23 DIAGNOSIS — E785 Hyperlipidemia, unspecified: Secondary | ICD-10-CM | POA: Diagnosis not present

## 2015-01-23 DIAGNOSIS — Z23 Encounter for immunization: Secondary | ICD-10-CM | POA: Diagnosis not present

## 2015-01-23 MED ORDER — EZETIMIBE 10 MG PO TABS: 10.0000 mg | ORAL_TABLET | Freq: Every day | ORAL | Status: DC

## 2015-01-23 NOTE — Patient Instructions (Signed)

## 2015-01-23 NOTE — Progress Notes (Signed)
Subjective:    Patient ID: [name], male    DOB: 1930-03-14, 81 y.o.   MRN: [id number]  HPI 81 year old gentleman here to follow-up hypertension, hyperlipidemia, and aortic valve disease. His main symptom relates to cognitive abilities and losses. He does do word puzzles. He is limited by his vision with glaucoma. He does not drive any longer. His wife does most of the talking and tries to answer questions for him. Appetite is good. Bowel and bladder function are normal. He denies any pain. He is on 4 medicines, to for cholesterol and 2 for blood pressure and heart. Labs have not been checked in about 5 years.   Patient Active Problem List   Diagnosis Date Noted  . S/P aortic valve replacement with bioprosthetic valve 09/13/2013  . Thrombocytopenia 08/17/2013  . Pulsatile groin mass 08/17/2013  . CAD (coronary artery disease) 08/17/2013  . Rash and nonspecific skin eruption 07/31/2013  . Allergic dermatitis 07/31/2013  . PREMATURE VENTRICULAR CONTRACTIONS 05/13/2010  . PALPITATIONS 05/07/2010  . ARM NUMBNESS 01/04/2009  . HYPERLIPIDEMIA 01/02/2009  . Essential hypertension 01/02/2009  . Aortic valve disorder 01/02/2009  . Cerebrovascular disease 01/02/2009  . DYSPNEA 01/02/2009   Outpatient Encounter Prescriptions as of 01/23/2015  Medication Sig  . dorzolamide (TRUSOPT) 2 % ophthalmic solution   . ezetimibe (ZETIA) 10 MG tablet Take 1 tablet (10 mg total) by mouth daily.  . metoprolol (LOPRESSOR) 50 MG tablet Take 0.5 tablets (25 mg total) by mouth 2 (two) times daily.  . Multiple Vitamins-Minerals (CENTRUM SILVER PO) Take by mouth.  . niacin (NIASPAN) 1000 MG CR tablet TAKE 1 TABLET BY MOUTH DAILY AT BEDTIME  . ramipril (ALTACE) 10 MG capsule Take 1 capsule (10 mg total) by mouth 2 (two) times daily.  . Travoprost, BAK Free, (TRAVATAN) 0.004 % SOLN ophthalmic solution Place 1 drop into both eyes at bedtime.  . [DISCONTINUED] aspirin 325 MG EC tablet Take 325 mg by mouth daily.     . [DISCONTINUED] dorzolamide-timolol (COSOPT) 22.3-6.8 MG/ML ophthalmic solution Place 1 drop into both eyes 2 (two) times daily.   . [DISCONTINUED] guaiFENesin (MUCINEX) 600 MG 12 hr tablet Take 600 mg by mouth 2 (two) times daily as needed for cough or to loosen phlegm.    No facility-administered encounter medications on file as of 01/23/2015.     Review of Systems  Constitutional: Negative.   Respiratory: Negative.   Cardiovascular: Negative.   Gastrointestinal: Negative.   Musculoskeletal: Negative.   Skin: Negative.   Neurological: Negative.   Psychiatric/Behavioral: Positive for decreased concentration.       Objective:   Physical Exam  Constitutional: He is oriented to person, place, and time. He appears well-developed and well-nourished.  HENT:  Head: Normocephalic.  Eyes: Pupils are equal, round, and reactive to light.  Neck: Normal range of motion.  Cardiovascular: Normal rate and regular rhythm.   Pulmonary/Chest: Effort normal and breath sounds normal.  Musculoskeletal: Normal range of motion.  Neurological: He is alert and oriented to person, place, and time.  Psychiatric: He has a normal mood and affect.    BP 125/73 mmHg  Pulse 69  Temp(Src) 97.7 F (36.5 C) (Oral)  Ht _0  (1.727 m)  Wt 140 lb 9.6 oz (63.776 kg)  BMI 21.38 kg/m2        Assessment & Plan:  1. Hyperlipidemia 10 use on Zetia and niacin. We do need to check lipids and liver today since it's been some time - CMP14+EGFR -  Lipid panel  2. Essential hypertension Pressure is well controlled on combination of asymmetric beta blocker  3. Elevated TSH He is not on thyroid medication. TSH was elevated when last checked in 2015.  Wardell Honour MD - TSH

## 2015-01-23 NOTE — Addendum Note (Signed)
Addended by: Ilean China on: 01/23/2015 10:30 AM   Modules accepted: Orders

## 2015-01-24 LAB — CMP14+EGFR
ALK PHOS: 42 IU/L (ref 39–117)
ALT: 15 IU/L (ref 0–44)
AST: 25 IU/L (ref 0–40)
Albumin/Globulin Ratio: 2.2 (ref 1.1–2.5)
Albumin: 4.4 g/dL (ref 3.5–4.7)
BILIRUBIN TOTAL: 0.7 mg/dL (ref 0.0–1.2)
BUN / CREAT RATIO: 32 — AB (ref 10–22)
BUN: 33 mg/dL — ABNORMAL HIGH (ref 8–27)
CO2: 21 mmol/L (ref 18–29)
Calcium: 9.4 mg/dL (ref 8.6–10.2)
Chloride: 102 mmol/L (ref 97–108)
Creatinine, Ser: 1.03 mg/dL (ref 0.76–1.27)
GFR calc Af Amer: 77 mL/min/{1.73_m2} (ref >59)
GFR calc non Af Amer: 67 mL/min/{1.73_m2} (ref >59)
Globulin, Total: 2 g/dL (ref 1.5–4.5)
Glucose: 109 mg/dL — ABNORMAL HIGH (ref 65–99)
Potassium: 4.3 mmol/L (ref 3.5–5.2)
SODIUM: 142 mmol/L (ref 134–144)
TOTAL PROTEIN: 6.4 g/dL (ref 6.0–8.5)

## 2015-01-24 LAB — TSH: TSH: 13.53 u[IU]/mL — ABNORMAL HIGH (ref 0.450–4.500)

## 2015-01-24 LAB — LIPID PANEL
CHOL/HDL RATIO: 2.7 ratio (ref 0.0–5.0)
Cholesterol, Total: 147 mg/dL (ref 100–199)
HDL: 54 mg/dL (ref >39)
LDL Calculated: 80 mg/dL (ref 0–99)
Triglycerides: 63 mg/dL (ref 0–149)
VLDL CHOLESTEROL CAL: 13 mg/dL (ref 5–40)

## 2015-01-24 MED ORDER — LEVOTHYROXINE SODIUM 50 MCG PO TABS: 50.0000 ug | ORAL_TABLET | Freq: Every day | ORAL | Status: DC

## 2015-01-24 NOTE — Addendum Note (Signed)
Addended by: Ilean China on: 01/24/2015 12:31 PM   Modules accepted: Orders

## 2015-02-15 ENCOUNTER — Encounter: Payer: Self-pay | Admitting: Gastroenterology

## 2015-03-19 ENCOUNTER — Telehealth: Payer: Self-pay | Admitting: Family Medicine

## 2015-03-20 NOTE — Telephone Encounter (Signed)
It's unlikely that thyroid supplement hurts causing symptoms I would recommend giving it another try but probably would not insist

## 2015-03-21 NOTE — Telephone Encounter (Signed)
Patients wife aware

## 2015-03-28 ENCOUNTER — Other Ambulatory Visit: Payer: Self-pay | Admitting: Cardiology

## 2015-04-10 ENCOUNTER — Ambulatory Visit (INDEPENDENT_AMBULATORY_CARE_PROVIDER_SITE_OTHER): Payer: Medicare Other

## 2015-04-10 DIAGNOSIS — Z23 Encounter for immunization: Secondary | ICD-10-CM

## 2015-04-22 NOTE — Progress Notes (Signed)
HPI: FU AVR (h/o AS); hx of carotid stenosis s/p CEA, HTN, HL. He is intol to statins. Echo 1/15 with normal LV function and severe AS (mean 52). LHC demonstrated non-obstructive CAD. Had AVR 3/15. Postoperative course was complicated by atrial fibrillation. He converted to NSR on amiodarone.  Studies:  - LHC (07/26/13): Left Main calcified, proximal mid and distal LAD 50, circumflex and RCA patent, EF 60%.  - Echo (07/24/13): EF 50-09%, grade 1 diastolic dysfunction, severe aortic stenosis (mean gradient 52 mmHg)  - Carotid US (09/11/13): No significant ICA stenosis.  Echocardiogram postoperatively in April of 2015 showed normal LV function, grade 1 diastolic dysfunction, bioprosthetic aortic valve with a mean gradient of 9 mm of mercury. There was mild mitral regurgitation and mild left atrial enlargement.  Since last seen, the patient denies any dyspnea on exertion, orthopnea, PND, pedal edema, palpitations, syncope or chest pain.   Current Outpatient Prescriptions  Medication Sig Dispense Refill  . aspirin 81 MG tablet Take 81 mg by mouth daily.    . dorzolamide (TRUSOPT) 2 % ophthalmic solution   3  . ezetimibe (ZETIA) 10 MG tablet Take 1 tablet (10 mg total) by mouth daily. 28 tablet 0  . metoprolol (LOPRESSOR) 50 MG tablet Take 0.5 tablets (25 mg total) by mouth 2 (two) times daily. 90 tablet 3  . Multiple Vitamins-Minerals (CENTRUM SILVER PO) Take by mouth.    . niacin (NIASPAN) 1000 MG CR tablet TAKE 1 TABLET BY MOUTH DAILY AT BEDTIME 90 tablet 0  . ramipril (ALTACE) 10 MG capsule Take 1 capsule (10 mg total) by mouth 2 (two) times daily. 180 capsule 3  . TIMOLOL MALEATE OP Apply 1 drop to eye 2 (two) times daily. LEFT EYE    . Travoprost, BAK Free, (TRAVATAN) 0.004 % SOLN ophthalmic solution Place 1 drop into both eyes at bedtime.     No current facility-administered medications for this visit.     Past Medical History  Diagnosis Date  . Cerebrovascular disease,  unspecified   . Aortic stenosis     a. s/p tissue AVR 08/2013;  b. Echo (09/2013):  Mild LVH, EF 55-60%, no RWMA, Gr 1 DD, AVR ok (mean gradient 9 mmHg), mild MR, mild LAE, mildly reduced RVSF, mild TR, PASP 32 mmHg.  Marland Kitchen HLD (hyperlipidemia)   . HTN (hypertension)   . CAD (coronary artery disease)   . Pneumonia July 23, 2013  . HOH (hard of hearing)     wears bilateral hearing aids  . Skin cancer of face     removed from nose  . Glaucoma     Bilateral eyes  . S/P aortic valve replacement with bioprosthetic valve 09/13/2013    25 mm [facility] Ease bovine bioprosthetic tissue valve    Past Surgical History  Procedure Laterality Date  . Carotid endarterectomy Right   . Cardiac catheterization      no PCI  . Colonoscopy w/ polypectomy    . Cataract extraction Bilateral   . Retinal detachment surgery    . Eye surgery    . Aortic valve replacement N/A 09/13/2013    Procedure: AORTIC VALVE REPLACEMENT (AVR);  Surgeon: Rexene Alberts, MD;  Location: [place];  Service: Open Heart Surgery;  Laterality: N/A;  . Intraoperative transesophageal echocardiogram N/A 09/13/2013    Procedure: INTRAOPERATIVE TRANSESOPHAGEAL ECHOCARDIOGRAM;  Surgeon: Rexene Alberts, MD;  Location: [place];  Service: Open Heart Surgery;  Laterality: N/A;  . Left and right  heart catheterization with coronary angiogram N/A 07/26/2013    Procedure: LEFT AND RIGHT HEART CATHETERIZATION WITH CORONARY ANGIOGRAM;  Surgeon: Sinclair Grooms, MD;  Location: [facility] CATH LAB;  Service: Cardiovascular;  Laterality: N/A;    Social History   Social History  . Marital Status: Married    Spouse Name: N/A  . Number of Children: N/A  . Years of Education: N/A   Occupational History  . Not on file.   Social History Main Topics  . Smoking status: Never Smoker   . Smokeless tobacco: Never Used     Comment: tobacco use - no  . Alcohol Use: No  . Drug Use: No  . Sexual Activity: Not on file   Other Topics Concern  . Not on file     Social History Narrative   Married.     ROS: no fevers or chills, productive cough, hemoptysis, dysphasia, odynophagia, melena, hematochezia, dysuria, hematuria, rash, seizure activity, orthopnea, PND, pedal edema, claudication. Remaining systems are negative.  Physical Exam: Well-developed well-nourished in no acute distress.  Skin is warm and dry.  HEENT is normal.  Neck is supple.  Chest is clear to auscultation with normal expansion.  Cardiovascular exam is regular rate and rhythm.  Abdominal exam nontender or distended. No masses palpated. Extremities show no edema. neuro grossly intact  ECG Sinus bradycardia, left bundle branch block.

## 2015-04-25 ENCOUNTER — Encounter: Payer: Self-pay | Admitting: Cardiology

## 2015-04-25 ENCOUNTER — Encounter: Payer: Self-pay | Admitting: *Deleted

## 2015-04-25 ENCOUNTER — Ambulatory Visit (INDEPENDENT_AMBULATORY_CARE_PROVIDER_SITE_OTHER): Payer: Medicare Other | Admitting: Cardiology

## 2015-04-25 VITALS — BP 98/58 | HR 53 | Ht 68.0 in | Wt 146.1 lb

## 2015-04-25 DIAGNOSIS — I679 Cerebrovascular disease, unspecified: Secondary | ICD-10-CM

## 2015-04-25 DIAGNOSIS — E785 Hyperlipidemia, unspecified: Secondary | ICD-10-CM | POA: Diagnosis not present

## 2015-04-25 DIAGNOSIS — Z954 Presence of other heart-valve replacement: Secondary | ICD-10-CM

## 2015-04-25 DIAGNOSIS — I10 Essential (primary) hypertension: Secondary | ICD-10-CM | POA: Diagnosis not present

## 2015-04-25 DIAGNOSIS — I2583 Coronary atherosclerosis due to lipid rich plaque: Secondary | ICD-10-CM

## 2015-04-25 DIAGNOSIS — I2510 Atherosclerotic heart disease of native coronary artery without angina pectoris: Secondary | ICD-10-CM

## 2015-04-25 DIAGNOSIS — Z953 Presence of xenogenic heart valve: Secondary | ICD-10-CM

## 2015-04-25 MED ORDER — RAMIPRIL 10 MG PO CAPS: 10.0000 mg | ORAL_CAPSULE | Freq: Two times a day (BID) | ORAL | Status: DC

## 2015-04-25 MED ORDER — RAMIPRIL 10 MG PO CAPS: 10.0000 mg | ORAL_CAPSULE | Freq: Every day | ORAL | Status: DC

## 2015-04-25 MED ORDER — METOPROLOL TARTRATE 50 MG PO TABS: 25.0000 mg | ORAL_TABLET | Freq: Two times a day (BID) | ORAL | Status: DC

## 2015-04-25 NOTE — Assessment & Plan Note (Addendum)
Blood pressure mildly decreased. Change Altace to 10 mg daily and follow.

## 2015-04-25 NOTE — Patient Instructions (Signed)
Medication Instructions:   DECREASE RAMIPRIL TO 10 MG ONCE DAILY  Testing/Procedures:  Your physician has requested that you have a carotid duplex. This test is an ultrasound of the carotid arteries in your neck. It looks at blood flow through these arteries that supply the brain with blood. Allow one hour for this exam. There are no restrictions or special instructions.    Follow-Up:  Your physician wants you to follow-up in: [place] will receive a reminder letter in the mail two months in advance. If you don't receive a letter, please call our office to schedule the follow-up appointment.   If you need a refill on your cardiac medications before your next appointment, please call your pharmacy.

## 2015-04-25 NOTE — Assessment & Plan Note (Signed)
Continue aspirin. Intolerant to statins. 

## 2015-04-25 NOTE — Assessment & Plan Note (Addendum)
Intolerant to statins. Continue diet and present medications.

## 2015-04-25 NOTE — Assessment & Plan Note (Signed)
Continue SBE prophylaxis. 

## 2015-04-25 NOTE — Assessment & Plan Note (Addendum)
Patient with history of carotid endarterectomy. Plan repeat carotid Dopplers. Continue aspirin. Intolerant to statins.

## 2015-05-13 ENCOUNTER — Ambulatory Visit (HOSPITAL_COMMUNITY)
Admission: RE | Admit: 2015-05-13 | Discharge: 2015-05-13 | Disposition: A | Discharge status: Home / Self Care | Payer: Medicare Other | Source: Ambulatory Visit | Attending: Cardiology | Admitting: Cardiology

## 2015-05-13 DIAGNOSIS — I10 Essential (primary) hypertension: Secondary | ICD-10-CM | POA: Insufficient documentation

## 2015-05-13 DIAGNOSIS — I679 Cerebrovascular disease, unspecified: Secondary | ICD-10-CM

## 2015-05-13 DIAGNOSIS — E785 Hyperlipidemia, unspecified: Secondary | ICD-10-CM | POA: Insufficient documentation

## 2015-05-13 DIAGNOSIS — I6523 Occlusion and stenosis of bilateral carotid arteries: Secondary | ICD-10-CM | POA: Insufficient documentation

## 2015-06-03 ENCOUNTER — Other Ambulatory Visit: Payer: Self-pay | Admitting: Cardiology

## 2015-06-03 NOTE — Telephone Encounter (Signed)
REFILL 

## 2015-07-03 ENCOUNTER — Other Ambulatory Visit: Payer: Self-pay | Admitting: Cardiology

## 2015-07-03 ENCOUNTER — Other Ambulatory Visit: Payer: Medicare Other

## 2015-07-03 DIAGNOSIS — I10 Essential (primary) hypertension: Secondary | ICD-10-CM

## 2015-07-03 DIAGNOSIS — E039 Hypothyroidism, unspecified: Secondary | ICD-10-CM

## 2015-07-03 DIAGNOSIS — I679 Cerebrovascular disease, unspecified: Secondary | ICD-10-CM

## 2015-07-03 DIAGNOSIS — Z953 Presence of xenogenic heart valve: Secondary | ICD-10-CM

## 2015-07-03 DIAGNOSIS — I2510 Atherosclerotic heart disease of native coronary artery without angina pectoris: Secondary | ICD-10-CM

## 2015-07-03 DIAGNOSIS — I2583 Coronary atherosclerosis due to lipid rich plaque: Secondary | ICD-10-CM

## 2015-07-03 MED ORDER — NIACIN ER 1000 MG PO TBCR: 1.0000 | EXTENDED_RELEASE_TABLET | Freq: Every day | ORAL | Status: DC

## 2015-07-03 NOTE — Telephone Encounter (Signed)
°*  STAT* If patient is at the pharmacy, call can be transferred to refill team.   1. Which medications need to be refilled? (please list name of each medication and dose if known) Niacin ER-new pharmacy and new prescription 2. Which pharmacy/location (including street and city if local pharmacy) is medication to be sent to?[phone]  3. Do they need a 30 day or 90 day supply? 90 and refills

## 2015-07-03 NOTE — Progress Notes (Signed)
Lab only 

## 2015-07-04 ENCOUNTER — Other Ambulatory Visit: Payer: Self-pay | Admitting: *Deleted

## 2015-07-04 LAB — TSH: TSH: 3 u[IU]/mL (ref 0.450–4.500)

## 2015-07-04 NOTE — Progress Notes (Signed)
Patient is not on thyroid supplement therefore needs no Rx

## 2015-07-04 NOTE — Addendum Note (Signed)
Addended by: Shelbie Ammons on: 07/04/2015 10:13 AM   Modules accepted: Orders

## 2015-07-08 ENCOUNTER — Telehealth: Payer: Self-pay | Admitting: Cardiology

## 2015-07-08 MED ORDER — EZETIMIBE 10 MG PO TABS: ORAL_TABLET | ORAL | Status: DC

## 2015-07-08 NOTE — Telephone Encounter (Signed)
Spoke with pt, refill for the generic form of zetia sent to the pharmacy. They will call if too expensive.

## 2015-07-08 NOTE — Telephone Encounter (Signed)
Please call about his Zetia.

## 2015-07-10 ENCOUNTER — Other Ambulatory Visit: Payer: Self-pay | Admitting: Family Medicine

## 2015-07-11 MED ORDER — LEVOTHYROXINE SODIUM 50 MCG PO TABS: ORAL_TABLET | ORAL | Status: DC

## 2015-07-11 NOTE — Telephone Encounter (Signed)
done

## 2015-07-22 ENCOUNTER — Telehealth: Payer: Self-pay | Admitting: Cardiology

## 2015-07-22 DIAGNOSIS — I10 Essential (primary) hypertension: Secondary | ICD-10-CM

## 2015-07-22 MED ORDER — RAMIPRIL 10 MG PO CAPS: 10.0000 mg | ORAL_CAPSULE | Freq: Every day | ORAL | Status: DC

## 2015-07-22 NOTE — Telephone Encounter (Signed)
Spoke with pt wife, they received something from insurance about the zetia. She is going to mail it to me. Also needs refill on ramipril, sent to pharmacy.

## 2015-08-07 ENCOUNTER — Telehealth: Payer: Self-pay | Admitting: Cardiology

## 2015-08-07 MED ORDER — ZETIA 10 MG PO TABS: 10.0000 mg | ORAL_TABLET | Freq: Every day | ORAL | Status: DC

## 2015-08-07 NOTE — Telephone Encounter (Signed)
Rx(s) sent to pharmacy electronically.  

## 2015-08-07 NOTE — Telephone Encounter (Signed)
Pt's wife calling re generic Zetia-[employer] will cover Zetia now-wantsrx called to [facility] pt # [phone]

## 2015-08-21 DIAGNOSIS — H401133 Primary open-angle glaucoma, bilateral, severe stage: Secondary | ICD-10-CM | POA: Diagnosis not present

## 2015-08-21 DIAGNOSIS — H04123 Dry eye syndrome of bilateral lacrimal glands: Secondary | ICD-10-CM | POA: Diagnosis not present

## 2015-08-21 DIAGNOSIS — H524 Presbyopia: Secondary | ICD-10-CM | POA: Diagnosis not present

## 2015-08-21 DIAGNOSIS — Z85828 Personal history of other malignant neoplasm of skin: Secondary | ICD-10-CM | POA: Diagnosis not present

## 2015-10-17 DIAGNOSIS — H524 Presbyopia: Secondary | ICD-10-CM | POA: Diagnosis not present

## 2015-10-17 DIAGNOSIS — H401133 Primary open-angle glaucoma, bilateral, severe stage: Secondary | ICD-10-CM | POA: Diagnosis not present

## 2015-10-17 DIAGNOSIS — H04123 Dry eye syndrome of bilateral lacrimal glands: Secondary | ICD-10-CM | POA: Diagnosis not present

## 2015-10-23 ENCOUNTER — Ambulatory Visit (INDEPENDENT_AMBULATORY_CARE_PROVIDER_SITE_OTHER): Payer: Medicare Other | Admitting: Family Medicine

## 2015-10-23 ENCOUNTER — Encounter: Payer: Self-pay | Admitting: Family Medicine

## 2015-10-23 VITALS — BP 110/64 | HR 81 | Temp 98.3°F | Ht 68.0 in | Wt 145.4 lb

## 2015-10-23 DIAGNOSIS — A6920 Lyme disease, unspecified: Secondary | ICD-10-CM | POA: Diagnosis not present

## 2015-10-23 MED ORDER — DOXYCYCLINE HYCLATE 100 MG PO TABS: 100.0000 mg | ORAL_TABLET | Freq: Two times a day (BID) | ORAL | Status: DC

## 2015-10-23 NOTE — Progress Notes (Signed)
BP 110/64 mmHg  Pulse 81  Temp(Src) 98.3 F (36.8 C) (Oral)  Ht 5\' 8"  (1.727 m)  Wt 145 lb 6.4 oz (65.953 kg)  BMI 22.11 kg/m2   Subjective:    Patient ID: [name], male    DOB: Mar 11, 1930, 82 y.o.   MRN: [id number]  HPI: [name] is a 82 y.o. male presenting on 10/23/2015 for Insect Bite   HPI Tick bite Patient noticed a tick on him last Friday which was 6 days ago. The tick was removed but since then he has developed this area of increasing rash around where the bite was with dark red coloration. It is not tender or pruritic but has been expanding slowly. He denies any fevers or chills or aches.  Relevant past medical, surgical, family and social history reviewed and updated as indicated. Interim medical history since our last visit reviewed. Allergies and medications reviewed and updated.  Review of Systems  Constitutional: Negative for fever.  HENT: Negative for ear discharge and ear pain.   Eyes: Negative for discharge and visual disturbance.  Respiratory: Negative for shortness of breath and wheezing.   Cardiovascular: Negative for chest pain and leg swelling.  Gastrointestinal: Negative for abdominal pain, diarrhea and constipation.  Genitourinary: Negative for difficulty urinating.  Musculoskeletal: Negative for back pain and gait problem.  Skin: Positive for rash and wound.  Neurological: Negative for syncope, light-headedness and headaches.  All other systems reviewed and are negative.   Per HPI unless specifically indicated above     Medication List       This list is accurate as of: 10/23/15  9:32 AM.  Always use your most recent med list.               aspirin 81 MG tablet  Take 81 mg by mouth daily.     CENTRUM SILVER PO  Take by mouth.     dorzolamide 2 % ophthalmic solution  Commonly known as:  TRUSOPT     doxycycline 100 MG tablet  Commonly known as:  VIBRA-TABS  Take 1 tablet (100 mg total) by mouth 2 (two) times daily. 1 po bid     levothyroxine 50 MCG tablet  Commonly known as:  SYNTHROID, LEVOTHROID  Take 50 mcg by mouth.     metoprolol 50 MG tablet  Commonly known as:  LOPRESSOR  Take 0.5 tablets (25 mg total) by mouth 2 (two) times daily.     Niacin CR 1000 MG Tbcr  Take 1 tablet (1,000 mg total) by mouth daily.     ramipril 10 MG capsule  Commonly known as:  ALTACE  Take 1 capsule (10 mg total) by mouth daily.     TIMOLOL MALEATE OP  Apply 1 drop to eye 2 (two) times daily. LEFT EYE     Travoprost (BAK Free) 0.004 % Soln ophthalmic solution  Commonly known as:  TRAVATAN  Place 1 drop into both eyes at bedtime.     ZETIA 10 MG tablet  Generic drug:  ezetimibe  Take 1 tablet (10 mg total) by mouth daily. TAKE 1 TABLET (10 MG TOTAL) BY MOUTH DAILY.           Objective:    BP 110/64 mmHg  Pulse 81  Temp(Src) 98.3 F (36.8 C) (Oral)  Ht 5\' 8"  (1.727 m)  Wt 145 lb 6.4 oz (65.953 kg)  BMI 22.11 kg/m2  Wt Readings from Last 3 Encounters:  10/23/15 145 lb 6.4 oz (65.953  kg)  04/25/15 146 lb 1.6 oz (66.271 kg)  01/23/15 140 lb 9.6 oz (63.776 kg)    Physical Exam  Constitutional: He is oriented to person, place, and time. He appears well-developed and well-nourished. No distress.  Eyes: Conjunctivae and EOM are normal. Pupils are equal, round, and reactive to light. Right eye exhibits no discharge. No scleral icterus.  Cardiovascular: Normal rate, regular rhythm, normal heart sounds and intact distal pulses.   No murmur heard. Pulmonary/Chest: Effort normal and breath sounds normal. No respiratory distress. He has no wheezes.  Musculoskeletal: Normal range of motion. He exhibits no edema.  Neurological: He is alert and oriented to person, place, and time. Coordination normal.  Skin: Skin is warm and dry. Rash noted. Rash is macular (Central eschar with surrounding area of 3 cm wide maroon-colored macules that blanch and are not tender. No central clearing noted). He is not diaphoretic.    Psychiatric: He has a normal mood and affect. His behavior is normal.  Vitals reviewed.   Results for orders placed or performed in visit on 07/03/15  TSH  Result Value Ref Range   TSH 3.000 0.450 - 4.500 uIU/mL      Assessment & Plan:   Problem List Items Addressed This Visit    None    Visit Diagnoses    Lyme disease, acute    -  Primary    Relevant Medications    doxycycline (VIBRA-TABS) 100 MG tablet        Follow up plan: Return if symptoms worsen or fail to improve.  Counseling provided for all of the vaccine components No orders of the defined types were placed in this encounter.    Caryl Pina, MD [facility] 10/23/2015, 9:32 AM

## 2015-10-28 ENCOUNTER — Ambulatory Visit (INDEPENDENT_AMBULATORY_CARE_PROVIDER_SITE_OTHER): Payer: Medicare Other | Admitting: Family Medicine

## 2015-10-28 ENCOUNTER — Encounter: Payer: Self-pay | Admitting: Family Medicine

## 2015-10-28 VITALS — BP 93/62 | HR 72 | Temp 97.0°F | Ht 68.0 in | Wt 143.0 lb

## 2015-10-28 DIAGNOSIS — I10 Essential (primary) hypertension: Secondary | ICD-10-CM | POA: Diagnosis not present

## 2015-10-28 DIAGNOSIS — E039 Hypothyroidism, unspecified: Secondary | ICD-10-CM

## 2015-10-28 DIAGNOSIS — E785 Hyperlipidemia, unspecified: Secondary | ICD-10-CM | POA: Diagnosis not present

## 2015-10-28 NOTE — Progress Notes (Signed)
Subjective:    Patient ID: [name], male    DOB: 02-05-1930, 82 y.o.   MRN: [id number]  HPI Pt here for follow up and management of chronic medical problems which includes hyperlipidemia and hypothyroid. He is taking most medications regularly.  He's had a recent visit for tick bite and was started on doxycycline. He also stopped his thyroid supplement because he thought it was giving him dizziness. He had a recent fall while carrying a garden hose. The thought was he probably tripped on a loop of toes. He has some abrasions on his left lower leg     Patient Active Problem List   Diagnosis Date Noted  . S/P aortic valve replacement with bioprosthetic valve 09/13/2013  . Thrombocytopenia ([place]) 08/17/2013  . Pulsatile groin mass 08/17/2013  . CAD (coronary artery disease) 08/17/2013  . Allergic dermatitis 07/31/2013  . PREMATURE VENTRICULAR CONTRACTIONS 05/13/2010  . PALPITATIONS 05/07/2010  . ARM NUMBNESS 01/04/2009  . Hyperlipemia 01/02/2009  . Essential hypertension 01/02/2009  . Aortic valve disorder 01/02/2009  . Cerebrovascular disease 01/02/2009  . DYSPNEA 01/02/2009   Outpatient Encounter Prescriptions as of 10/28/2015  Medication Sig  . aspirin 81 MG tablet Take 81 mg by mouth daily.  . dorzolamide (TRUSOPT) 2 % ophthalmic solution   . doxycycline (VIBRA-TABS) 100 MG tablet Take 1 tablet (100 mg total) by mouth 2 (two) times daily. 1 po bid  . metoprolol (LOPRESSOR) 50 MG tablet Take 0.5 tablets (25 mg total) by mouth 2 (two) times daily.  . Multiple Vitamins-Minerals (CENTRUM SILVER PO) Take by mouth.  . Niacin CR 1000 MG TBCR Take 1 tablet (1,000 mg total) by mouth daily.  . ramipril (ALTACE) 10 MG capsule Take 1 capsule (10 mg total) by mouth daily.  Marland Kitchen TIMOLOL MALEATE OP Apply 1 drop to eye 2 (two) times daily. LEFT EYE  . Travoprost, BAK Free, (TRAVATAN) 0.004 % SOLN ophthalmic solution Place 1 drop into both eyes at bedtime.  Marland Kitchen ZETIA 10 MG tablet Take 1 tablet  (10 mg total) by mouth daily. TAKE 1 TABLET (10 MG TOTAL) BY MOUTH DAILY.  Marland Kitchen levothyroxine (SYNTHROID, LEVOTHROID) 50 MCG tablet Take 50 mcg by mouth. (Patient not taking: Reported on 10/28/2015)   No facility-administered encounter medications on file as of 10/28/2015.      Review of Systems  Constitutional: Negative.   HENT: Negative.   Eyes: Negative.   Respiratory: Negative.   Cardiovascular: Negative.   Gastrointestinal: Negative.   Endocrine: Negative.   Genitourinary: Negative.   Musculoskeletal: Negative.   Skin: Negative.   Allergic/Immunologic: Negative.   Neurological: Negative.   Hematological: Negative.   Psychiatric/Behavioral: Negative.        Objective:   Physical Exam  Constitutional: He is oriented to person, place, and time. He appears well-developed and well-nourished.  Cardiovascular: Normal rate, regular rhythm and normal heart sounds.   Pulmonary/Chest: Effort normal and breath sounds normal.  Musculoskeletal: Normal range of motion.  Neurological: He is alert and oriented to person, place, and time. Coordination normal.    BP 93/62 mmHg  Pulse 72  Temp(Src) 97 F (36.1 C) (Oral)  Ht 5' 8"  (1.727 m)  Wt 143 lb (64.864 kg)  BMI 21.75 kg/m2       Assessment & Plan:  1. Hyperlipidemia Patient continues to take Zetia and niacin for lipids - Lipid panel  2. Essential hypertension Blood pressure is a little on the low side. - CMP14+EGFR  3. Hypothyroidism, unspecified hypothyroidism type  I suspect the thyroid pill was not related to his dizziness but since he does not want to restart it we can follow his thyroid function at 6 month intervals and addressed the problem at a future date when thyroid levels dictate.  Wardell Honour MD - TSH

## 2015-10-28 NOTE — Patient Instructions (Signed)
Medicare Annual Wellness Visit  [facility] and the medical providers at [facility] strive to bring you the best medical care.  In doing so we not only want to address your current medical conditions and concerns but also to detect new conditions early and prevent illness, disease and health-related problems.    Medicare offers a yearly Wellness Visit which allows our clinical staff to assess your need for preventative services including immunizations, lifestyle education, counseling to decrease risk of preventable diseases and screening for fall risk and other medical concerns.    This visit is provided free of charge (no copay) for all Medicare recipients. The clinical pharmacists at [facility] have begun to conduct these Wellness Visits which will also include a thorough review of all your medications.    As you primary medical provider recommend that you make an appointment for your Annual Wellness Visit if you have not done so already this year.  You may set up this appointment before you leave today or you may call back ([phone]) and schedule an appointment.  Please make sure when you call that you mention that you are scheduling your Annual Wellness Visit with the clinical pharmacist so that the appointment may be made for the proper length of time.     Continue current medications. Continue good therapeutic lifestyle changes which include good diet and exercise. Fall precautions discussed with patient. If an FOBT was given today- please return it to our front desk. If you are over 50 years old - you may need Prevnar 13 or the adult Pneumonia vaccine.  **Flu shots are available--- please call and schedule a FLU-CLINIC appointment**  After your visit with us today you will receive a survey in the mail or online from [employer] regarding your care with us. Please take a moment to fill this out. Your feedback is very  important to us as you can help us better understand your patient needs as well as improve your experience and satisfaction. WE CARE ABOUT YOU!!!    

## 2015-10-29 LAB — CMP14+EGFR
A/G RATIO: 2.1 (ref 1.2–2.2)
ALT: 13 IU/L (ref 0–44)
AST: 25 IU/L (ref 0–40)
Albumin: 4.4 g/dL (ref 3.5–4.7)
Alkaline Phosphatase: 41 IU/L (ref 39–117)
BUN/Creatinine Ratio: 31 — ABNORMAL HIGH (ref 10–24)
BUN: 38 mg/dL — AB (ref 8–27)
Bilirubin Total: 1 mg/dL (ref 0.0–1.2)
CALCIUM: 8.9 mg/dL (ref 8.6–10.2)
CHLORIDE: 103 mmol/L (ref 96–106)
CO2: 20 mmol/L (ref 18–29)
Creatinine, Ser: 1.24 mg/dL (ref 0.76–1.27)
GFR, EST AFRICAN AMERICAN: 61 mL/min/{1.73_m2} (ref >59)
GFR, EST NON AFRICAN AMERICAN: 53 mL/min/{1.73_m2} — AB (ref >59)
GLUCOSE: 106 mg/dL — AB (ref 65–99)
Globulin, Total: 2.1 g/dL (ref 1.5–4.5)
POTASSIUM: 4 mmol/L (ref 3.5–5.2)
Sodium: 141 mmol/L (ref 134–144)
TOTAL PROTEIN: 6.5 g/dL (ref 6.0–8.5)

## 2015-10-29 LAB — LIPID PANEL
CHOL/HDL RATIO: 2.4 ratio (ref 0.0–5.0)
Cholesterol, Total: 138 mg/dL (ref 100–199)
HDL: 58 mg/dL (ref >39)
LDL Calculated: 69 mg/dL (ref 0–99)
TRIGLYCERIDES: 53 mg/dL (ref 0–149)
VLDL CHOLESTEROL CAL: 11 mg/dL (ref 5–40)

## 2015-10-29 LAB — TSH: TSH: 11.63 u[IU]/mL — AB (ref 0.450–4.500)

## 2016-02-20 DIAGNOSIS — H524 Presbyopia: Secondary | ICD-10-CM | POA: Diagnosis not present

## 2016-02-20 DIAGNOSIS — H401133 Primary open-angle glaucoma, bilateral, severe stage: Secondary | ICD-10-CM | POA: Diagnosis not present

## 2016-02-20 DIAGNOSIS — H04123 Dry eye syndrome of bilateral lacrimal glands: Secondary | ICD-10-CM | POA: Diagnosis not present

## 2016-02-27 ENCOUNTER — Encounter: Payer: Self-pay | Admitting: Family Medicine

## 2016-02-27 ENCOUNTER — Ambulatory Visit (INDEPENDENT_AMBULATORY_CARE_PROVIDER_SITE_OTHER): Payer: Medicare Other | Admitting: Family Medicine

## 2016-02-27 VITALS — BP 101/58 | HR 57 | Temp 96.8°F | Ht 68.0 in | Wt 137.2 lb

## 2016-02-27 DIAGNOSIS — I10 Essential (primary) hypertension: Secondary | ICD-10-CM

## 2016-02-27 DIAGNOSIS — E039 Hypothyroidism, unspecified: Secondary | ICD-10-CM | POA: Diagnosis not present

## 2016-02-27 NOTE — Progress Notes (Signed)
   Subjective:   : [name], male    DOB: June 10, 1930, 82 y.o.   MRN: [id number]  HPI 82 year old gentleman who is here to follow-up hypertension hypothyroid and hyperlipidemia. Concern today is weight on his and lack of appetite. He denies any aches or pains. There are no joint symptomfurther dizziness related to tick exposure several months backst IWife restarted his thyroid supplement and he has had no further dizziness    Outpatient Encounter Prescriptions as of 02/27/2016  Medication Sig  . aspirin 81 MG tablet Take 81 mg by mouth daily.  . dorzolamide (TRUSOPT) 2 % ophthalmic solution   . levothyroxine (SYNTHROID, LEVOTHROID) 50 MCG tablet Take 50 mcg by mouth.  . metoprolol (LOPRESSOR) 50 MG tablet Take 0.5 tablets (25 mg total) by mouth 2 (two) times daily.  . Multiple Vitamins-Minerals (CENTRUM SILVER PO) Take by mouth.  . Niacin CR 1000 MG TBCR Take 1 tablet (1,000 mg total) by mouth daily.  . ramipril (ALTACE) 10 MG capsule Take 1 capsule (10 mg total) by mouth daily.  Marland Kitchen TIMOLOL MALEATE OP Apply 1 drop to eye 2 (two) times daily. LEFT EYE  . Travoprost, BAK Free, (TRAVATAN) 0.004 % SOLN ophthalmic solution Place 1 drop into both eyes at bedtime.  Marland Kitchen ZETIA 10 MG tablet Take 1 tablet (10 mg total) by mouth daily. TAKE 1 TABLET (10 MG TOTAL) BY MOUTH DAILY.  . [DISCONTINUED] doxycycline (VIBRA-TABS) 100 MG tablet Take 1 tablet (100 mg total) by mouth 2 (two) times daily. 1 po bid   No facility-administered encounter medications on file as of 02/27/2016.       Review of Systems  Constitutional: Positive for appetite change and unexpected weight change.  Respiratory: Negative.   Cardiovascular: Negative.   Neurological: Negative.   Psychiatric/Behavioral: Negative.        Objective:   Physical Exam  Constitutional: He is oriented to person, place, and time. He appears well-developed and well-nourished.  HENT:  Head: Normocephalic.  Cardiovascular: Normal rate and regular  rhythm.   Pulmonary/Chest: Effort normal.  Neurological: He is alert and oriented to person, place, and time.  Psychiatric: He has a normal mood and affect.  He does have some memory loss   BP (!) 101/58 (BP Location: Right Arm, Patient Position: Sitting, Cuff Size: Normal)   Pulse (!) 57   Temp (!) 96.8 F (36 C) (Oral)   Ht 5\' 8"  (1.727 m)   Wt 137 lb 3.2 oz (62.2 kg)   BMI 20.86 kg/m         Assessment & Plan:  1. Essential hypertension Blood pressure has been a little on the low side. I would like to hold ramipril will continue metoprolol and monitor pressure at home  2. Hypothyroidism, unspecified hypothyroidism type Since discontinuing and then restarting thyroid   Wardell Honour MDsupplement need to reassess TSH - TSH

## 2016-02-28 LAB — TSH: TSH: 4.46 u[IU]/mL (ref 0.450–4.500)

## 2016-04-14 ENCOUNTER — Ambulatory Visit (INDEPENDENT_AMBULATORY_CARE_PROVIDER_SITE_OTHER): Payer: Medicare Other

## 2016-04-14 DIAGNOSIS — Z23 Encounter for immunization: Secondary | ICD-10-CM | POA: Diagnosis not present

## 2016-05-15 ENCOUNTER — Other Ambulatory Visit: Payer: Self-pay | Admitting: Cardiology

## 2016-05-15 DIAGNOSIS — Z953 Presence of xenogenic heart valve: Secondary | ICD-10-CM

## 2016-05-15 DIAGNOSIS — I10 Essential (primary) hypertension: Secondary | ICD-10-CM

## 2016-05-15 DIAGNOSIS — I2510 Atherosclerotic heart disease of native coronary artery without angina pectoris: Secondary | ICD-10-CM

## 2016-05-15 DIAGNOSIS — I2583 Coronary atherosclerosis due to lipid rich plaque: Secondary | ICD-10-CM

## 2016-05-15 DIAGNOSIS — I679 Cerebrovascular disease, unspecified: Secondary | ICD-10-CM

## 2016-06-01 NOTE — Progress Notes (Signed)
HPI: FU AVR (h/o AS); hx of carotid stenosis s/p CEA, HTN, HL. He is intol to statins. Echo 1/15 with normal LV function and severe AS (mean 52). LHC demonstrated non-obstructive CAD. Had AVR 3/15. Postoperative course was complicated by atrial fibrillation. He converted to NSR on amiodarone.  Studies:  - LHC (07/26/13): Left Main calcified, proximal mid and distal LAD 50, circumflex and RCA patent, EF 60%.  - Echo (07/24/13): EF 123456, grade 1 diastolic dysfunction, severe aortic stenosis (mean gradient 52 mmHg)  - Carotid US (11/16): 1-39 bilateral ICA stenosis.  Echocardiogram postoperatively in April of 2015 showed normal LV function, grade 1 diastolic dysfunction, bioprosthetic aortic valve with a mean gradient of 9 mm of mercury. There was mild mitral regurgitation and mild left atrial enlargement.  Since last seen, the patient has dyspnea with more extreme activities but not with routine activities. It is relieved with rest. It is not associated with chest pain. There is no orthopnea, PND or pedal edema. There is no syncope or palpitations. There is no exertional chest pain.   Current Outpatient Prescriptions  Medication Sig Dispense Refill  . aspirin 81 MG tablet Take 81 mg by mouth daily.    . dorzolamide (TRUSOPT) 2 % ophthalmic solution Place 1 drop into the left eye 3 (three) times daily.   3  . levothyroxine (SYNTHROID, LEVOTHROID) 50 MCG tablet Take 50 mcg by mouth. 90 tablet 3  . metoprolol (LOPRESSOR) 50 MG tablet TAKE ONE-HALF TABLET BY MOUTH TWICE DAILY 90 tablet 2  . Multiple Vitamins-Minerals (CENTRUM SILVER PO) Take by mouth.    . Niacin CR 1000 MG TBCR Take 1 tablet (1,000 mg total) by mouth daily. 90 each 3  . ramipril (ALTACE) 10 MG capsule Take 1 capsule (10 mg total) by mouth daily. 90 capsule 3  . TIMOLOL MALEATE OP Place 1 drop into the left eye 2 (two) times daily. LEFT EYE     . Travoprost, BAK Free, (TRAVATAN) 0.004 % SOLN ophthalmic solution Place 1  drop into the left eye at bedtime.     Marland Kitchen ZETIA 10 MG tablet Take 1 tablet (10 mg total) by mouth daily. TAKE 1 TABLET (10 MG TOTAL) BY MOUTH DAILY. 30 tablet 11   No current facility-administered medications for this visit.      Past Medical History:  Diagnosis Date  . Aortic stenosis    a. s/p tissue AVR 08/2013;  b. Echo (09/2013):  Mild LVH, EF 55-60%, no RWMA, Gr 1 DD, AVR ok (mean gradient 9 mmHg), mild MR, mild LAE, mildly reduced RVSF, mild TR, PASP 32 mmHg.  Marland Kitchen CAD (coronary artery disease)   . Cerebrovascular disease, unspecified   . Glaucoma    Bilateral eyes  . HLD (hyperlipidemia)   . HOH (hard of hearing)    wears bilateral hearing aids  . HTN (hypertension)   . Pneumonia July 23, 2013  . S/P aortic valve replacement with bioprosthetic valve 09/13/2013   25 mm [facility] Ease bovine bioprosthetic tissue valve  . Skin cancer of face    removed from nose    Past Surgical History:  Procedure Laterality Date  . AORTIC VALVE REPLACEMENT N/A 09/13/2013   Procedure: AORTIC VALVE REPLACEMENT (AVR);  Surgeon: Rexene Alberts, MD;  Location: [place];  Service: Open Heart Surgery;  Laterality: N/A;  . CARDIAC CATHETERIZATION     no PCI  . CAROTID ENDARTERECTOMY Right   . CATARACT EXTRACTION Bilateral   .  COLONOSCOPY W/ POLYPECTOMY    . EYE SURGERY    . INTRAOPERATIVE TRANSESOPHAGEAL ECHOCARDIOGRAM N/A 09/13/2013   Procedure: INTRAOPERATIVE TRANSESOPHAGEAL ECHOCARDIOGRAM;  Surgeon: Rexene Alberts, MD;  Location: [place];  Service: Open Heart Surgery;  Laterality: N/A;  . LEFT AND RIGHT HEART CATHETERIZATION WITH CORONARY ANGIOGRAM N/A 07/26/2013   Procedure: LEFT AND RIGHT HEART CATHETERIZATION WITH CORONARY ANGIOGRAM;  Surgeon: Sinclair Grooms, MD;  Location: [facility] CATH LAB;  Service: Cardiovascular;  Laterality: N/A;  . RETINAL DETACHMENT SURGERY      Social History   Social History  . Marital status: Married    Spouse name: N/A  . Number of children: N/A  . Years of  education: N/A   Occupational History  . Not on file.   Social History Main Topics  . Smoking status: Never Smoker  . Smokeless tobacco: Never Used     Comment: tobacco use - no  . Alcohol use No  . Drug use: No  . Sexual activity: Not on file   Other Topics Concern  . Not on file   Social History Narrative   Married.     Family History  Problem Relation Age of Onset  . Colon cancer Father   . Heart failure Mother     CHF     ROS: no fevers or chills, productive cough, hemoptysis, dysphasia, odynophagia, melena, hematochezia, dysuria, hematuria, rash, seizure activity, orthopnea, PND, pedal edema, claudication. Remaining systems are negative.  Physical Exam: Well-developed well-nourished in no acute distress.  Skin is warm and dry.  HEENT is normal.  Neck is supple.  Chest is clear to auscultation with normal expansion.  Cardiovascular exam is regular rate and rhythm.  Abdominal exam nontender or distended. No masses palpated. Extremities show no edema. neuro grossly intact  ECG-Sinus bradycardia, left bundle branch block.  A/P  1 hyperlipidemia-intolerant to statins. Continue present medications and diet.  2 hypertension-blood pressure controlled. However he is taking 10 mg every other day. I will change to 5 mg daily and follow blood pressure.  3 carotid artery disease-continue aspirin. Schedule follow-up carotid Dopplers.  4 status post aortic valve replacement-continue SBE prophylaxis.  5. Coronary artery disease-continue aspirin. Intolerant to statins.   Kirk Ruths, MD

## 2016-06-04 ENCOUNTER — Ambulatory Visit (INDEPENDENT_AMBULATORY_CARE_PROVIDER_SITE_OTHER): Payer: Medicare Other | Admitting: Cardiology

## 2016-06-04 ENCOUNTER — Encounter: Payer: Self-pay | Admitting: Cardiology

## 2016-06-04 VITALS — BP 118/60 | HR 56 | Ht 68.0 in | Wt 138.0 lb

## 2016-06-04 DIAGNOSIS — I679 Cerebrovascular disease, unspecified: Secondary | ICD-10-CM

## 2016-06-04 DIAGNOSIS — I10 Essential (primary) hypertension: Secondary | ICD-10-CM | POA: Diagnosis not present

## 2016-06-04 MED ORDER — RAMIPRIL 5 MG PO CAPS: 5.0000 mg | ORAL_CAPSULE | Freq: Every day | ORAL | 5 refills | Status: DC

## 2016-06-04 NOTE — Patient Instructions (Addendum)
Medication Instructions:   DECREASE RAMIPRIL TO 5 MG ONCE DAILY  Your physician has requested that you have a carotid duplex. This test is an ultrasound of the carotid arteries in your neck. It looks at blood flow through these arteries that supply the brain with blood. Allow one hour for this exam. There are no restrictions or special instructions.  Follow-Up:  Your physician wants you to follow-up in: [place] will receive a reminder letter in the mail two months in advance. If you don't receive a letter, please call our office to schedule the follow-up appointment.   If you need a refill on your cardiac medications before your next appointment, please call your pharmacy.

## 2016-06-12 ENCOUNTER — Ambulatory Visit (INDEPENDENT_AMBULATORY_CARE_PROVIDER_SITE_OTHER): Payer: Medicare Other | Admitting: *Deleted

## 2016-06-12 VITALS — BP 119/69 | HR 47 | Ht 68.0 in | Wt 138.0 lb

## 2016-06-12 DIAGNOSIS — Z0000 Encounter for general adult medical examination without abnormal findings: Secondary | ICD-10-CM | POA: Diagnosis not present

## 2016-06-12 NOTE — Patient Instructions (Signed)
  [name] ,  Thank you for taking time to come for your Medicare Wellness Visit. I appreciate your ongoing commitment to your health goals. Please review the following plan we discussed and let me know if I can assist you in the future.  Continue to do puzzles and exercise your mind.  Keep appointment with Dr Sabra Heck in February.  These are the goals we discussed: Goals    . Exercise 3x per week (30 min per time)          Continue to stay active. Try to walk for several times per week as the weather permits.        This is a list of the screening recommended for you and due dates:  Health Maintenance  Topic Date Due  . Shingles Vaccine  12/11/2016*  . Tetanus Vaccine  12/11/2016*  . Flu Shot  Completed  . Pneumonia vaccines  Completed  *Topic was postponed. The date shown is not the original due date.

## 2016-06-12 NOTE — Progress Notes (Signed)
Subjective:   [name] is a 82 y.o. male who presents for an Initial Medicare Annual Wellness Visit. [name] is accompanied by his wife. He lives at home with his wife and is retired from [employer]. They do not have any children. He enjoys working puzzles in the news paper and stays busy around his home.   Review of Systems   Cardiac Risk Factors include: advanced age (>7men, >13 women);hypertension;male gender  Reports that health is about the same as last year.    Objective:    Today's Vitals   06/12/16 1106  BP: 119/69  Pulse: (!) 47  Weight: 138 lb (62.6 kg)  Height: 5\' 8"  (1.727 m)   Body mass index is 20.98 kg/m.  Current Medications (verified) Outpatient Encounter Prescriptions as of 06/12/2016  Medication Sig  . aspirin 81 MG tablet Take 81 mg by mouth daily.  . dorzolamide (TRUSOPT) 2 % ophthalmic solution Place 1 drop into the left eye 3 (three) times daily.   Marland Kitchen levothyroxine (SYNTHROID, LEVOTHROID) 50 MCG tablet Take 50 mcg by mouth.  . metoprolol (LOPRESSOR) 50 MG tablet TAKE ONE-HALF TABLET BY MOUTH TWICE DAILY  . Multiple Vitamins-Minerals (CENTRUM SILVER PO) Take by mouth.  . Niacin CR 1000 MG TBCR Take 1 tablet (1,000 mg total) by mouth daily.  . ramipril (ALTACE) 5 MG capsule Take 1 capsule (5 mg total) by mouth daily.  Marland Kitchen TIMOLOL MALEATE OP Place 1 drop into the left eye 2 (two) times daily. LEFT EYE   . Travoprost, BAK Free, (TRAVATAN) 0.004 % SOLN ophthalmic solution Place 1 drop into the left eye at bedtime.   Marland Kitchen ZETIA 10 MG tablet Take 1 tablet (10 mg total) by mouth daily. TAKE 1 TABLET (10 MG TOTAL) BY MOUTH DAILY.   No facility-administered encounter medications on file as of 06/12/2016.     Allergies (verified) Levaquin [levofloxacin]   History: Past Medical History:  Diagnosis Date  . Aortic stenosis    a. s/p tissue AVR 08/2013;  b. Echo (09/2013):  Mild LVH, EF 55-60%, no RWMA, Gr 1 DD, AVR ok (mean gradient 9 mmHg), mild Gregory,  mild LAE, mildly reduced RVSF, mild TR, PASP 32 mmHg.  Marland Kitchen CAD (coronary artery disease)   . Cerebrovascular disease, unspecified   . Glaucoma    Bilateral eyes  . HLD (hyperlipidemia)   . HOH (hard of hearing)    wears bilateral hearing aids  . HTN (hypertension)   . Pneumonia July 23, 2013  . S/P aortic valve replacement with bioprosthetic valve 09/13/2013   25 mm [facility] Ease bovine bioprosthetic tissue valve  . Skin cancer of face    removed from nose   Past Surgical History:  Procedure Laterality Date  . AORTIC VALVE REPLACEMENT N/A 09/13/2013   Procedure: AORTIC VALVE REPLACEMENT (AVR);  Surgeon: Rexene Alberts, MD;  Location: [place];  Service: Open Heart Surgery;  Laterality: N/A;  . CARDIAC CATHETERIZATION     no PCI  . CAROTID ENDARTERECTOMY Right   . CATARACT EXTRACTION Bilateral   . COLONOSCOPY W/ POLYPECTOMY    . EYE SURGERY    . INTRAOPERATIVE TRANSESOPHAGEAL ECHOCARDIOGRAM N/A 09/13/2013   Procedure: INTRAOPERATIVE TRANSESOPHAGEAL ECHOCARDIOGRAM;  Surgeon: Rexene Alberts, MD;  Location: [place];  Service: Open Heart Surgery;  Laterality: N/A;  . LEFT AND RIGHT HEART CATHETERIZATION WITH CORONARY ANGIOGRAM N/A 07/26/2013   Procedure: LEFT AND RIGHT HEART CATHETERIZATION WITH CORONARY ANGIOGRAM;  Surgeon: Belva Crome III,  MD;  Location: [place] CATH LAB;  Service: Cardiovascular;  Laterality: N/A;  . RETINAL DETACHMENT SURGERY     Family History  Problem Relation Age of Onset  . Colon cancer Father   . Heart failure Mother     CHF   . Birth defects Sister    Social History   Occupational History  . Not on file.   Social History Main Topics  . Smoking status: Never Smoker  . Smokeless tobacco: Never Used     Comment: tobacco use - no  . Alcohol use No  . Drug use: No  . Sexual activity: No   Tobacco Counseling No tobacco use  Activities of Daily Living In your present state of health, do you have any difficulty performing the following activities:  06/12/2016  Hearing? Y  Vision? Y  Difficulty concentrating or making decisions? Y  Walking or climbing stairs? N  Dressing or bathing? N  Doing errands, shopping? N  Preparing Food and eating ? N  Using the Toilet? N  In the past six months, have you accidently leaked urine? N  Do you have problems with loss of bowel control? N  Managing your Medications? N  Managing your Finances? N  Housekeeping or managing your Housekeeping? N  Some recent data might be hidden    Immunizations and Health Maintenance Immunization History  Administered Date(s) Administered  . Influenza,inj,Quad PF,36+ Mos 05/03/2013, 04/16/2014, 04/10/2015, 04/14/2016  . Pneumococcal Conjugate-13 01/23/2015  . Pneumococcal Polysaccharide-23 07/27/2013   There are no preventive care reminders to display for this patient.  Patient Care Team: Wardell Honour, MD as PCP - General (Family Medicine) Dorann Ou, MD as Consulting Physician (Ophthalmology)  [facility] in [address] [place]-opthalmology  Assessment:   This is a routine wellness examination for [name].  Hearing/Vision screen Significant hearing loss noted during visit. No problems with vision noted but patient reports difficulty seeing. He sees Dr [name] very routinely.   Dietary issues and exercise activities discussed: Exercise limited by: Other - see comments (eye sight)  Goals    . Exercise 3x per week (30 min per time)          Continue to stay active. Try to walk for several times per week as the weather permits.       He eats very well. They eat mostly home prepared meals. He eats eggs and toast for breakfast. For lunch he has a banana, peanut butter crackers, and milk and she cooks a full country style supper each night.   Depression Screen PHQ 2/9 Scores 06/12/2016 02/27/2016 10/28/2015 10/23/2015  PHQ - 2 Score 0 0 0 0    Fall Risk Fall Risk  06/12/2016 02/27/2016 10/28/2015 10/28/2015 10/23/2015  Falls in the past year? No No Yes No No    Number falls in past yr: - - 1 - -  Injury with Fall? - - No - -    Cognitive Function:  MMSE not done today due to severe hearing loss. Patient reports some difficulty memory and has noticed a decline over the years. He refers to his wife to answer a lot of questions. This was partly because of hearing but he also relies on her to remember things. He has discussed this with Dr Sabra Heck.     Screening Tests Health Maintenance  Topic Date Due  . ZOSTAVAX  12/11/2016 (Originally 09/20/1991)  . TETANUS/TDAP  12/11/2016 (Originally [birth year]20[birth year]1950)  . INFLUENZA VACCINE  Completed  . PNA vac Low Risk Adult  Completed        Plan:  Keep appt with Dr Sabra Heck in 07/2016.  He may benefit from an audiology referral. I will look into what services and tests were offered at [facility] and will put in a referral to audiology if he would benefit from it.   Encouraged him to exercise his mind. Continue to work puzzles daily and work on remembering things on his own instead of relying on his wife. Can discuss again with Dr Sabra Heck at his next visit.   During the course of the visit Brown was educated and counseled about the following appropriate screening and preventive services:   Vaccines to include Pneumoccal-up to date, Influenza-up to date, Td-declined, Zostavax-declined  Cardiovascular disease screening-sees cardiology regularly  Diabetes screening-with routine labs  Glaucoma screening-yearly with Dr [name]  Nutrition counseling  Exercise-continue to stay active and walk as often as possible  Patient Instructions (the written plan) were given to the patient.   Chong Sicilian, RN  06/12/2016   I have reviewed and agree with the above AWV documentation.   Arrie Senate MD

## 2016-07-01 ENCOUNTER — Ambulatory Visit (HOSPITAL_COMMUNITY)
Admission: RE | Admit: 2016-07-01 | Discharge: 2016-07-01 | Disposition: A | Discharge status: Home / Self Care | Payer: Medicare Other | Source: Ambulatory Visit | Attending: Cardiology | Admitting: Cardiology

## 2016-07-01 DIAGNOSIS — I6523 Occlusion and stenosis of bilateral carotid arteries: Secondary | ICD-10-CM | POA: Insufficient documentation

## 2016-07-01 DIAGNOSIS — I679 Cerebrovascular disease, unspecified: Secondary | ICD-10-CM | POA: Insufficient documentation

## 2016-07-01 DIAGNOSIS — I2510 Atherosclerotic heart disease of native coronary artery without angina pectoris: Secondary | ICD-10-CM | POA: Insufficient documentation

## 2016-07-01 DIAGNOSIS — I10 Essential (primary) hypertension: Secondary | ICD-10-CM | POA: Insufficient documentation

## 2016-07-01 DIAGNOSIS — E785 Hyperlipidemia, unspecified: Secondary | ICD-10-CM | POA: Insufficient documentation

## 2016-07-02 ENCOUNTER — Telehealth: Payer: Self-pay | Admitting: Cardiology

## 2016-07-02 NOTE — Telephone Encounter (Signed)
New Message  [name] from [facility] call requesting to speak with RN. She states she had some questions about a request that was sent to [facility]. About medication Zetia 10mg . Please call back to discuss

## 2016-07-02 NOTE — Telephone Encounter (Signed)
°  New Prob   Requesting pre-authorization for Zetia b/c it is non formulary. Please call.

## 2016-07-02 NOTE — Telephone Encounter (Signed)
Returned call to [facility] that the patient needs a prior authorization for Zetia. Called to inform.  This can be submitted via covermymeds (www.covermymeds.com/epa/bcbsnc) Or Faxed to [phone]  Pt does have noted intolerance to statins.  PA request routed to [facility].

## 2016-07-03 NOTE — Telephone Encounter (Signed)
Number provided is not a valid number. Spoke with pt wife, she reports getting a phone call saying the zetia was approved.

## 2016-07-07 ENCOUNTER — Other Ambulatory Visit: Payer: Self-pay | Admitting: Cardiology

## 2016-07-09 DIAGNOSIS — H524 Presbyopia: Secondary | ICD-10-CM | POA: Diagnosis not present

## 2016-07-09 DIAGNOSIS — H401133 Primary open-angle glaucoma, bilateral, severe stage: Secondary | ICD-10-CM | POA: Diagnosis not present

## 2016-07-09 DIAGNOSIS — H04123 Dry eye syndrome of bilateral lacrimal glands: Secondary | ICD-10-CM | POA: Diagnosis not present

## 2016-07-13 ENCOUNTER — Telehealth: Payer: Self-pay | Admitting: Cardiology

## 2016-07-13 MED ORDER — EZETIMIBE 10 MG PO TABS
10.0000 mg | ORAL_TABLET | Freq: Every day | ORAL | 11 refills | Status: DC
Start: 2016-07-13 — End: 2017-07-16

## 2016-07-13 NOTE — Telephone Encounter (Signed)
Returned call advised patient I discussed w pharmD, no problems w generic vs brand for bioavailability, etc . Have sent this to his preferred pharmacy, caller aware and voiced thanks.

## 2016-07-13 NOTE — Telephone Encounter (Signed)
New message   Pt wife verbalized that she want to change pt medication to generic brand so pt can afford it Ezetimebine 10mg 

## 2016-07-28 ENCOUNTER — Encounter: Payer: Self-pay | Admitting: Family Medicine

## 2016-07-28 ENCOUNTER — Other Ambulatory Visit: Payer: Self-pay | Admitting: Family Medicine

## 2016-07-28 ENCOUNTER — Ambulatory Visit (INDEPENDENT_AMBULATORY_CARE_PROVIDER_SITE_OTHER): Payer: Medicare Other

## 2016-07-28 ENCOUNTER — Ambulatory Visit (INDEPENDENT_AMBULATORY_CARE_PROVIDER_SITE_OTHER): Payer: Medicare Other | Admitting: Family Medicine

## 2016-07-28 VITALS — BP 157/69 | HR 62 | Temp 97.0°F | Ht 68.0 in | Wt 135.8 lb

## 2016-07-28 DIAGNOSIS — R52 Pain, unspecified: Secondary | ICD-10-CM

## 2016-07-28 DIAGNOSIS — M546 Pain in thoracic spine: Secondary | ICD-10-CM

## 2016-07-28 MED ORDER — HYDROCODONE-ACETAMINOPHEN 5-325 MG PO TABS: 1.0000 | ORAL_TABLET | Freq: Four times a day (QID) | ORAL | 0 refills | Status: DC | PRN

## 2016-07-28 NOTE — Patient Instructions (Addendum)
Great to meet you!  Try the hydrocodone as needed for pain.   Be sure to take deep breaths every 2 hours, blowing bubbles or blowing up a balloon is a good idea.   Come back with any concerns

## 2016-07-28 NOTE — Progress Notes (Signed)
   HPI  Patient presents today here with rib pain after fall.  Patient explains that this morning he was walking down a wheelchair ramp in the front of his home when he slipped on ice landing on his left upper back. He has acute pain in that area, but no midline back pain, headache, or shoulder pain.  Severe pain and states that if he does not get any relief he would have to go to the emergency room. He seen a few hours before his scheduled appointment.  He denies any difficulty breathing but states it does hurt to take deep breaths.  He states that he was unable to get up and had to yell  for his wife's help for several minutes.  PMH: Smoking status noted ROS: Per HPI  Objective: BP (!) 157/69 (BP Location: Left Arm, Patient Position: Sitting, Cuff Size: Normal)   Pulse 62   Temp 97 F (36.1 C) (Oral)   Ht 5\' 8"  (1.727 m)   Wt 135 lb 12.8 oz (61.6 kg)   SpO2 99%   BMI 20.65 kg/m  Gen: NAD, alert, cooperative with exam HEENT: NCAT, PERRLA CV: RRR, good S1/S2, no murmur Resp: CTABL, no wheezes, non-labored Abd: SNTND, BS present, no guarding or organomegaly Ext: No edema, warm Neuro: Alert and oriented, No gross deficits  CXR:  No Clear fracture, chronically elevated L hemidiaphragm, Sternotomy sutures,  Valve seen on lateral  Assessment and plan:  # L upper back pain After a fall, with point tenderness on L upper back.  Given hydrocodone  for the pain.  Discussed ice Recommended using IS No headache, LOC, or concern for head injury.  Low threshold for return   Meds ordered this encounter  Medications  . HYDROcodone-acetaminophen (NORCO) 5-325 MG tablet    Sig: Take 1 tablet by mouth every 6 (six) hours as needed for moderate pain.    Dispense:  30 tablet    Refill:  0    Laroy Apple, MD [facility] 07/28/2016, 1:39 PM

## 2016-08-04 ENCOUNTER — Ambulatory Visit (INDEPENDENT_AMBULATORY_CARE_PROVIDER_SITE_OTHER): Payer: Medicare Other | Admitting: Family Medicine

## 2016-08-04 ENCOUNTER — Encounter: Payer: Self-pay | Admitting: Family Medicine

## 2016-08-04 VITALS — BP 155/77 | HR 84 | Temp 96.8°F | Ht 68.0 in | Wt 140.0 lb

## 2016-08-04 DIAGNOSIS — E7800 Pure hypercholesterolemia, unspecified: Secondary | ICD-10-CM | POA: Diagnosis not present

## 2016-08-04 DIAGNOSIS — I10 Essential (primary) hypertension: Secondary | ICD-10-CM

## 2016-08-04 NOTE — Progress Notes (Signed)
Subjective:    Patient ID: [name], male    DOB: Apr 01, 1930, 83 y.o.   MRN: [id number]  HPI Pt here for follow up and management of chronic medical problems which includes hyperlipidemia.   Patient fell on ice about a week ago and still has pain in his left upper back and ribs. Review x-ray showed no fractures. He has not laid in bed and sleeps sitting up due to discomfort. He has developed some dependent edema sleeping with his feet down. He denies any shortness of breath or chest pain.   Patient Active Problem List   Diagnosis Date Noted  . Hypothyroidism 02/27/2016  . S/P aortic valve replacement with bioprosthetic valve 09/13/2013  . Thrombocytopenia ([place]) 08/17/2013  . Pulsatile groin mass 08/17/2013  . CAD (coronary artery disease) 08/17/2013  . Allergic dermatitis 07/31/2013  . PREMATURE VENTRICULAR CONTRACTIONS 05/13/2010  . PALPITATIONS 05/07/2010  . ARM NUMBNESS 01/04/2009  . Hyperlipemia 01/02/2009  . Essential hypertension 01/02/2009  . Aortic valve disorder 01/02/2009  . Cerebrovascular disease 01/02/2009  . DYSPNEA 01/02/2009   Outpatient Encounter Prescriptions as of 08/04/2016  Medication Sig  . aspirin 81 MG tablet Take 81 mg by mouth daily.  . dorzolamide (TRUSOPT) 2 % ophthalmic solution Place 1 drop into the left eye 3 (three) times daily.   Marland Kitchen ezetimibe (ZETIA) 10 MG tablet Take 1 tablet (10 mg total) by mouth daily. TAKE 1 TABLET (10 MG TOTAL) BY MOUTH DAILY.  Marland Kitchen HYDROcodone-acetaminophen (NORCO) 5-325 MG tablet Take 1 tablet by mouth every 6 (six) hours as needed for moderate pain.  Marland Kitchen levothyroxine (SYNTHROID, LEVOTHROID) 50 MCG tablet Take 50 mcg by mouth.  . metoprolol (LOPRESSOR) 50 MG tablet TAKE ONE-HALF TABLET BY MOUTH TWICE DAILY  . Multiple Vitamins-Minerals (CENTRUM SILVER PO) Take by mouth.  . Niacin CR 1000 MG TBCR TAKE ONE TABLET BY MOUTH ONCE DAILY  . ramipril (ALTACE) 5 MG capsule Take 1 capsule (5 mg total) by mouth daily.  Marland Kitchen TIMOLOL  MALEATE OP Place 1 drop into the left eye 2 (two) times daily. LEFT EYE   . Travoprost, BAK Free, (TRAVATAN) 0.004 % SOLN ophthalmic solution Place 1 drop into the left eye at bedtime.    No facility-administered encounter medications on file as of 08/04/2016.      Review of Systems  Constitutional: Negative.   HENT: Negative.   Eyes: Negative.   Respiratory: Negative.   Cardiovascular: Positive for leg swelling.  Gastrointestinal: Negative.   Endocrine: Negative.   Genitourinary: Negative.   Musculoskeletal: Positive for back pain (from recent fall ).  Skin: Negative.   Allergic/Immunologic: Negative.   Neurological: Negative.   Hematological: Negative.   Psychiatric/Behavioral: Negative.        Objective:   Physical Exam  Constitutional: He is oriented to person, place, and time. He appears well-developed and well-nourished.  Cardiovascular: Normal rate and regular rhythm.   Pulmonary/Chest: Effort normal and breath sounds normal.  Musculoskeletal: He exhibits no edema. Tenderness: around left scapula.  Neurological: He is alert and oriented to person, place, and time.   BP (!) 155/77 (BP Location: Left Arm)   Pulse 84   Temp (!) 96.8 F (36 C) (Oral)   Ht 5' 8"  (1.727 m)   Wt 140 lb (63.5 kg)   BMI 21.29 kg/m         Assessment & Plan:  1. Essential hypertension Blood pressure 155/77. Medications include ramipril and metoprolol - CMP14+EGFR  2. Pure hypercholesterolemia Patient takes  Zetia and niacin for lipids. - Lipid panel Wardell Honour MD

## 2016-08-04 NOTE — Patient Instructions (Signed)
Medicare Annual Wellness Visit   and the medical providers at [facility] strive to bring you the best medical care.  In doing so we not only want to address your current medical conditions and concerns but also to detect new conditions early and prevent illness, disease and health-related problems.    Medicare offers a yearly Wellness Visit which allows our clinical staff to assess your need for preventative services including immunizations, lifestyle education, counseling to decrease risk of preventable diseases and screening for fall risk and other medical concerns.    This visit is provided free of charge (no copay) for all Medicare recipients. The clinical pharmacists at [facility] have begun to conduct these Wellness Visits which will also include a thorough review of all your medications.    As you primary medical provider recommend that you make an appointment for your Annual Wellness Visit if you have not done so already this year.  You may set up this appointment before you leave today or you may call back ([phone]) and schedule an appointment.  Please make sure when you call that you mention that you are scheduling your Annual Wellness Visit with the clinical pharmacist so that the appointment may be made for the proper length of time.     Continue current medications. Continue good therapeutic lifestyle changes which include good diet and exercise. Fall precautions discussed with patient. If an FOBT was given today- please return it to our front desk. If you are over 50 years old - you may need Prevnar 13 or the adult Pneumonia vaccine.  **Flu shots are available--- please call and schedule a FLU-CLINIC appointment**  After your visit with us today you will receive a survey in the mail or online from [employer] regarding your care with us. Please take a moment to fill this out. Your feedback is very  important to us as you can help us better understand your patient needs as well as improve your experience and satisfaction. WE CARE ABOUT YOU!!!    

## 2016-08-19 DIAGNOSIS — D225 Melanocytic nevi of trunk: Secondary | ICD-10-CM | POA: Diagnosis not present

## 2016-08-19 DIAGNOSIS — D1801 Hemangioma of skin and subcutaneous tissue: Secondary | ICD-10-CM | POA: Diagnosis not present

## 2016-08-19 DIAGNOSIS — L821 Other seborrheic keratosis: Secondary | ICD-10-CM | POA: Diagnosis not present

## 2016-08-20 ENCOUNTER — Other Ambulatory Visit: Payer: Self-pay | Admitting: Family Medicine

## 2016-11-12 DIAGNOSIS — H401133 Primary open-angle glaucoma, bilateral, severe stage: Secondary | ICD-10-CM | POA: Diagnosis not present

## 2016-11-12 DIAGNOSIS — H524 Presbyopia: Secondary | ICD-10-CM | POA: Diagnosis not present

## 2016-11-12 DIAGNOSIS — H04123 Dry eye syndrome of bilateral lacrimal glands: Secondary | ICD-10-CM | POA: Diagnosis not present

## 2016-11-18 ENCOUNTER — Encounter: Payer: Self-pay | Admitting: Family Medicine

## 2016-11-18 ENCOUNTER — Ambulatory Visit (INDEPENDENT_AMBULATORY_CARE_PROVIDER_SITE_OTHER): Payer: Medicare Other | Admitting: Family Medicine

## 2016-11-18 VITALS — BP 118/61 | HR 55 | Temp 97.3°F | Ht 68.0 in | Wt 139.4 lb

## 2016-11-18 DIAGNOSIS — Z0000 Encounter for general adult medical examination without abnormal findings: Secondary | ICD-10-CM

## 2016-11-18 DIAGNOSIS — E039 Hypothyroidism, unspecified: Secondary | ICD-10-CM

## 2016-11-18 DIAGNOSIS — E785 Hyperlipidemia, unspecified: Secondary | ICD-10-CM

## 2016-11-18 DIAGNOSIS — I2510 Atherosclerotic heart disease of native coronary artery without angina pectoris: Secondary | ICD-10-CM

## 2016-11-18 NOTE — Patient Instructions (Signed)
Great to see you!  Come back in 4 months unless you need us sooner.    

## 2016-11-18 NOTE — Progress Notes (Signed)
   HPI  Patient presents today here for an annual physical exam.  Patient feels well and has no complaints.  He denies any dyspnea, chest pain, palpitations, or leg edema. He has a good appetite. He had his wife watch their diet carefully, the mostly that vegetables.   He has another health screening coming from [place] tomorrow at home.   He has restarted taking Synthroid a few months ago.  PMH: Smoking status noted ROS: Per HPI  Objective: BP 118/61   Pulse (!) 55   Temp 97.3 F (36.3 C) (Oral)   Ht '5\' 8"'$  (1.727 m)   Wt 139 lb 6.4 oz (63.2 kg)   BMI 21.20 kg/m  Gen: NAD, alert, cooperative with exam HEENT: NCAT CV: RRR, good S1/S2 Resp: CTABL, no wheezes, non-labored Ext: No edema, warm Neuro: Alert and oriented, No gross deficits  Assessment and plan:  # Annual physical exam Normal exam, healthy weight Healthcare maintenance appears to be up-to-date  # Hypothyroidism Continue Synthroid, repeat TSH  # Hyperlipidemia Statin intolerant, clinically stable, watching diet well. Continue zetia Repeat labs today  # CAD Asymptomatic, continue beta blocker, ACE inhibitor, daily aspirin History of aortic valve replacement, nonobstructive CAD on cath, CEA    Orders Placed This Encounter  Procedures  . TSH  . CBC with Differential/Platelet  . CMP14+EGFR  . Lipid panel     Laroy Apple, MD [facility] 11/18/2016, 10:23 AM

## 2016-11-19 LAB — LIPID PANEL
CHOLESTEROL TOTAL: 128 mg/dL (ref 100–199)
Chol/HDL Ratio: 2.2 ratio (ref 0.0–5.0)
HDL: 59 mg/dL (ref >39)
LDL Calculated: 56 mg/dL (ref 0–99)
Triglycerides: 66 mg/dL (ref 0–149)
VLDL Cholesterol Cal: 13 mg/dL (ref 5–40)

## 2016-11-19 LAB — CBC WITH DIFFERENTIAL/PLATELET
BASOS ABS: 0 10*3/uL (ref 0.0–0.2)
Basos: 0 %
EOS (ABSOLUTE): 0.2 10*3/uL (ref 0.0–0.4)
Eos: 4 %
HEMATOCRIT: 40.3 % (ref 37.5–51.0)
HEMOGLOBIN: 13.6 g/dL (ref 13.0–17.7)
IMMATURE GRANULOCYTES: 0 %
Immature Grans (Abs): 0 10*3/uL (ref 0.0–0.1)
LYMPHS: 30 %
Lymphocytes Absolute: 1.5 10*3/uL (ref 0.7–3.1)
MCH: 31.2 pg (ref 26.6–33.0)
MCHC: 33.7 g/dL (ref 31.5–35.7)
MCV: 92 fL (ref 79–97)
Monocytes Absolute: 0.4 10*3/uL (ref 0.1–0.9)
Monocytes: 8 %
NEUTROS PCT: 58 %
Neutrophils Absolute: 2.8 10*3/uL (ref 1.4–7.0)
Platelets: 113 10*3/uL — ABNORMAL LOW (ref 150–379)
RBC: 4.36 x10E6/uL (ref 4.14–5.80)
RDW: 13.4 % (ref 12.3–15.4)
WBC: 4.8 10*3/uL (ref 3.4–10.8)

## 2016-11-19 LAB — CMP14+EGFR
A/G RATIO: 2 (ref 1.2–2.2)
ALBUMIN: 4.2 g/dL (ref 3.5–4.7)
ALK PHOS: 47 IU/L (ref 39–117)
ALT: 13 IU/L (ref 0–44)
AST: 24 IU/L (ref 0–40)
BILIRUBIN TOTAL: 0.7 mg/dL (ref 0.0–1.2)
BUN / CREAT RATIO: 22 (ref 10–24)
BUN: 25 mg/dL (ref 8–27)
CHLORIDE: 103 mmol/L (ref 96–106)
CO2: 26 mmol/L (ref 18–29)
Calcium: 9.3 mg/dL (ref 8.6–10.2)
Creatinine, Ser: 1.12 mg/dL (ref 0.76–1.27)
GFR calc Af Amer: 69 mL/min/{1.73_m2} (ref >59)
GFR calc non Af Amer: 60 mL/min/{1.73_m2} (ref >59)
GLOBULIN, TOTAL: 2.1 g/dL (ref 1.5–4.5)
Glucose: 94 mg/dL (ref 65–99)
Potassium: 4.5 mmol/L (ref 3.5–5.2)
SODIUM: 139 mmol/L (ref 134–144)
Total Protein: 6.3 g/dL (ref 6.0–8.5)

## 2016-11-19 LAB — TSH: TSH: 4.88 u[IU]/mL — AB (ref 0.450–4.500)

## 2017-01-25 ENCOUNTER — Ambulatory Visit (INDEPENDENT_AMBULATORY_CARE_PROVIDER_SITE_OTHER): Payer: Medicare Other | Admitting: *Deleted

## 2017-01-25 VITALS — BP 118/54 | HR 54 | Ht 64.0 in | Wt 137.0 lb

## 2017-01-25 DIAGNOSIS — Z0000 Encounter for general adult medical examination without abnormal findings: Secondary | ICD-10-CM | POA: Diagnosis not present

## 2017-01-25 NOTE — Patient Instructions (Signed)
  [name] , Thank you for taking time to come for your Medicare Wellness Visit. I appreciate your ongoing commitment to your health goals. Please review the following plan we discussed and let me know if I can assist you in the future.   These are the goals we discussed: Goals    . Exercise 3x per week (30 min per time)          Continue to stay active. Try to walk for several times per week as the weather permits.        This is a list of the screening recommended for you and due dates:  Health Maintenance  Topic Date Due  . Tetanus Vaccine  04/27/2017*  . Flu Shot  01/27/2017  . Pneumonia vaccines  Completed  *Topic was postponed. The date shown is not the original due date.   Keep follow up with Dr Wendi Snipes in September Follow up with eye doctor sooner than scheduled appointment if vision continues to worsen. Bring copy of Advance Directives to our office.

## 2017-01-26 NOTE — Progress Notes (Signed)
Subjective:   [name] is a 83 y.o. male who presents for a subsequent Medicare Annual Wellness Visit. 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 He lives with his wife at home. They do not have any children or pets. He enjoys playing the piano, gardening, and yardwork. He does not do any formal exercise.   Review of Systems  States that overall health is about the same as last year but his vision has gotten worse.   Cardiac Risk Factors include: advanced age (>8men, >50 women);dyslipidemia;male gender;sedentary lifestyle;hypertension  Other systems negative.   Objective:    Today's Vitals   01/25/17 1123  BP: (!) 118/54  Pulse: (!) 54  Weight: 137 lb (62.1 kg)  Height: 5\' 4"  (1.626 m)   Body mass index is 23.52 kg/m.  Current Medications (verified) Outpatient Encounter Prescriptions as of 01/25/2017  Medication Sig  . aspirin 81 MG tablet Take 81 mg by mouth daily.  . dorzolamide (TRUSOPT) 2 % ophthalmic solution Place 1 drop into the left eye 3 (three) times daily.   Marland Kitchen ezetimibe (ZETIA) 10 MG tablet Take 1 tablet (10 mg total) by mouth daily. TAKE 1 TABLET (10 MG TOTAL) BY MOUTH DAILY.  Marland Kitchen levothyroxine (SYNTHROID, LEVOTHROID) 50 MCG tablet TAKE ONE TABLET BY MOUTH ONCE DAILY  . metoprolol (LOPRESSOR) 50 MG tablet TAKE ONE-HALF TABLET BY MOUTH TWICE DAILY  . Multiple Vitamins-Minerals (CENTRUM SILVER PO) Take by mouth.  . Niacin CR 1000 MG TBCR TAKE ONE TABLET BY MOUTH ONCE DAILY  . ramipril (ALTACE) 5 MG capsule Take 1 capsule (5 mg total) by mouth daily.  Marland Kitchen TIMOLOL MALEATE OP Place 1 drop into the left eye 2 (two) times daily. LEFT EYE   . Travoprost, BAK Free, (TRAVATAN) 0.004 % SOLN ophthalmic solution Place 1 drop into the left eye at bedtime.    No facility-administered encounter medications on file as of 01/25/2017.     Allergies (verified) Levaquin [levofloxacin]   History: Past Medical History:  Diagnosis Date  . Aortic stenosis    a. s/p tissue AVR 08/2013;   b. Echo (09/2013):  Mild LVH, EF 55-60%, no RWMA, Gr 1 DD, AVR ok (mean gradient 9 mmHg), mild Gregory, mild LAE, mildly reduced RVSF, mild TR, PASP 32 mmHg.  Marland Kitchen CAD (coronary artery disease)   . Cerebrovascular disease, unspecified   . Glaucoma    Bilateral eyes  . HLD (hyperlipidemia)   . HOH (hard of hearing)    wears bilateral hearing aids  . HTN (hypertension)   . Pneumonia July 23, 2013  . S/P aortic valve replacement with bioprosthetic valve 09/13/2013   25 mm [facility] Ease bovine bioprosthetic tissue valve  . Skin cancer of face    removed from nose   Past Surgical History:  Procedure Laterality Date  . AORTIC VALVE REPLACEMENT N/A 09/13/2013   Procedure: AORTIC VALVE REPLACEMENT (AVR);  Surgeon: Rexene Alberts, MD;  Location: [place];  Service: Open Heart Surgery;  Laterality: N/A;  . CARDIAC CATHETERIZATION     no PCI  . CAROTID ENDARTERECTOMY Right   . CATARACT EXTRACTION Bilateral   . COLONOSCOPY W/ POLYPECTOMY    . EYE SURGERY    . INTRAOPERATIVE TRANSESOPHAGEAL ECHOCARDIOGRAM N/A 09/13/2013   Procedure: INTRAOPERATIVE TRANSESOPHAGEAL ECHOCARDIOGRAM;  Surgeon: Rexene Alberts, MD;  Location: [place];  Service: Open Heart Surgery;  Laterality: N/A;  . LEFT AND RIGHT HEART CATHETERIZATION WITH CORONARY ANGIOGRAM N/A 07/26/2013   Procedure: LEFT AND RIGHT HEART  CATHETERIZATION WITH CORONARY ANGIOGRAM;  Surgeon: Sinclair Grooms, MD;  Location: [facility] CATH LAB;  Service: Cardiovascular;  Laterality: N/A;  . RETINAL DETACHMENT SURGERY     Family History  Problem Relation Age of Onset  . Colon cancer Father   . Heart failure Mother        CHF   . Birth defects Sister    Social History   Occupational History  . Not on file.   Social History Main Topics  . Smoking status: Never Smoker  . Smokeless tobacco: Never Used     Comment: tobacco use - no  . Alcohol use No  . Drug use: No  . Sexual activity: No   Tobacco Counseling No tobacco use  Activities of Daily  Living In your present state of health, do you have any difficulty performing the following activities: 01/25/2017 06/12/2016  Hearing? Y Y  Comment - Has hearing aids but still doesn't hear well  Vision? Y Y  Comment - Wears glasses but has difficulty seeing. Has had several visits with Dr Bing Plume this year  Difficulty concentrating or making decisions? Y Y  Comment - Patient reports that he has had a delcine in memory of past 10 years  Walking or climbing stairs? N N  Dressing or bathing? N N  Doing errands, shopping? Y N  Comment wife helps -  Conservation officer, nature and eating ? N N  Using the Toilet? N N  In the past six months, have you accidently leaked urine? N N  Do you have problems with loss of bowel control? N N  Managing your Medications? Y N  Comment wife -  Managing your Finances? Y N  Comment wife -  Horticulturist, commercial your Housekeeping? N N  Some recent data might be hidden    Immunizations and Health Maintenance Immunization History  Administered Date(s) Administered  . Influenza,inj,Quad PF,36+ Mos 05/03/2013, 04/16/2014, 04/10/2015, 04/14/2016  . Pneumococcal Conjugate-13 01/23/2015  . Pneumococcal Polysaccharide-23 07/27/2013   There are no preventive care reminders to display for this patient.  Patient Care Team: Timmothy Euler, MD as PCP - General (Family Medicine) Dorann Ou, MD as Consulting Physician (Ophthalmology) Calvert Cantor, MD as Consulting Physician (Ophthalmology)  No hospitalizations, ER visits, or surgeries this past year.  Assessment:   This is a routine wellness examination for [name].   Hearing/Vision screen Severe hearing deficit noted. Patient reports decreased vision. Last eye exam was 08/2016 and he is scheduled to see them in September for a follow up.  Dietary issues and exercise activities discussed: Current Exercise Habits: The patient does not participate in regular exercise at present (works in garden and yard), Exercise  limited by: Other - see comments (vision)  Diet: Eats 3 meals a day and snacks  Goals    . Exercise 3x per week (30 min per time)          Continue to stay active. Try to walk for several times per week as the weather permits.       Depression Screen PHQ 2/9 Scores 01/25/2017 11/18/2016 08/04/2016 06/12/2016  PHQ - 2 Score 0 0 0 0    Fall Risk Fall Risk  01/25/2017 11/18/2016 08/04/2016 06/12/2016 02/27/2016  Falls in the past year? Yes No Yes No No  Number falls in past yr: 1 - 1 - -  Injury with Fall? No - Yes - -  Risk for fall due to : Impaired balance/gait - - - -  Follow  up Falls prevention discussed - - - -    Cognitive Function: MMSE - Mini Mental State Exam 01/25/2017  Orientation to time 3  Orientation to Place 5  Registration 3  Attention/ Calculation 5  Recall 1  Language- name 2 objects 2  Language- repeat 1  Language- follow 3 step command 3  Language- read & follow direction 1  Write a sentence 1  Copy design 1  Total score 26        Screening Tests Health Maintenance  Topic Date Due  . TETANUS/TDAP  04/27/2017 (Originally 09/20/1948)  . INFLUENZA VACCINE  01/27/2017  . PNA vac Low Risk Adult  Completed        Plan:    Keep follow up with Dr Wendi Snipes in September Follow up with eye doctor sooner than scheduled appointment if vision continues to worsen. Bring copy of Advance Directives to our office.  Continue to stay active. Try to walk or exercise for 30 minutes daily. Move carefully to avoid falls.   I have personally reviewed and noted the following in the patient's chart:    . Medical and social history . Use of alcohol, tobacco or illicit drugs  . Current medications and supplements . Functional ability and status . Nutritional status . Physical activity . Advanced directives . List of other physicians . Hospitalizations, surgeries, and ER visits in previous 12 months . Vitals . Screenings to include cognitive, depression, and  falls . Referrals and appointments  In addition, I have reviewed and discussed with patient certain preventive protocols, quality metrics, and best practice recommendations. A written personalized care plan for preventive services as well as general preventive health recommendations were provided to patient.     Chong Sicilian, RN  01/26/2017     I have reviewed and agree with the above AWV documentation.   Note MMSE score  Laroy Apple, MD [facility] 01/26/2017, 11:55 AM

## 2017-02-18 ENCOUNTER — Other Ambulatory Visit: Payer: Self-pay | Admitting: Cardiology

## 2017-02-18 DIAGNOSIS — Z953 Presence of xenogenic heart valve: Secondary | ICD-10-CM

## 2017-02-18 DIAGNOSIS — I10 Essential (primary) hypertension: Secondary | ICD-10-CM

## 2017-02-18 DIAGNOSIS — I2510 Atherosclerotic heart disease of native coronary artery without angina pectoris: Secondary | ICD-10-CM

## 2017-02-18 DIAGNOSIS — I679 Cerebrovascular disease, unspecified: Secondary | ICD-10-CM

## 2017-02-18 DIAGNOSIS — I2583 Coronary atherosclerosis due to lipid rich plaque: Secondary | ICD-10-CM

## 2017-03-05 IMAGING — DX DG RIBS W/ CHEST 3+V*L*
3 series · 3 of 3 positions shown · non-contrast
Comparison: 10/11/2013

CLINICAL DATA: Fell on ramp this morning. Left scapula and rib
pain.

EXAM:
LEFT RIBS AND CHEST - 3+ VIEW

[chest pa]
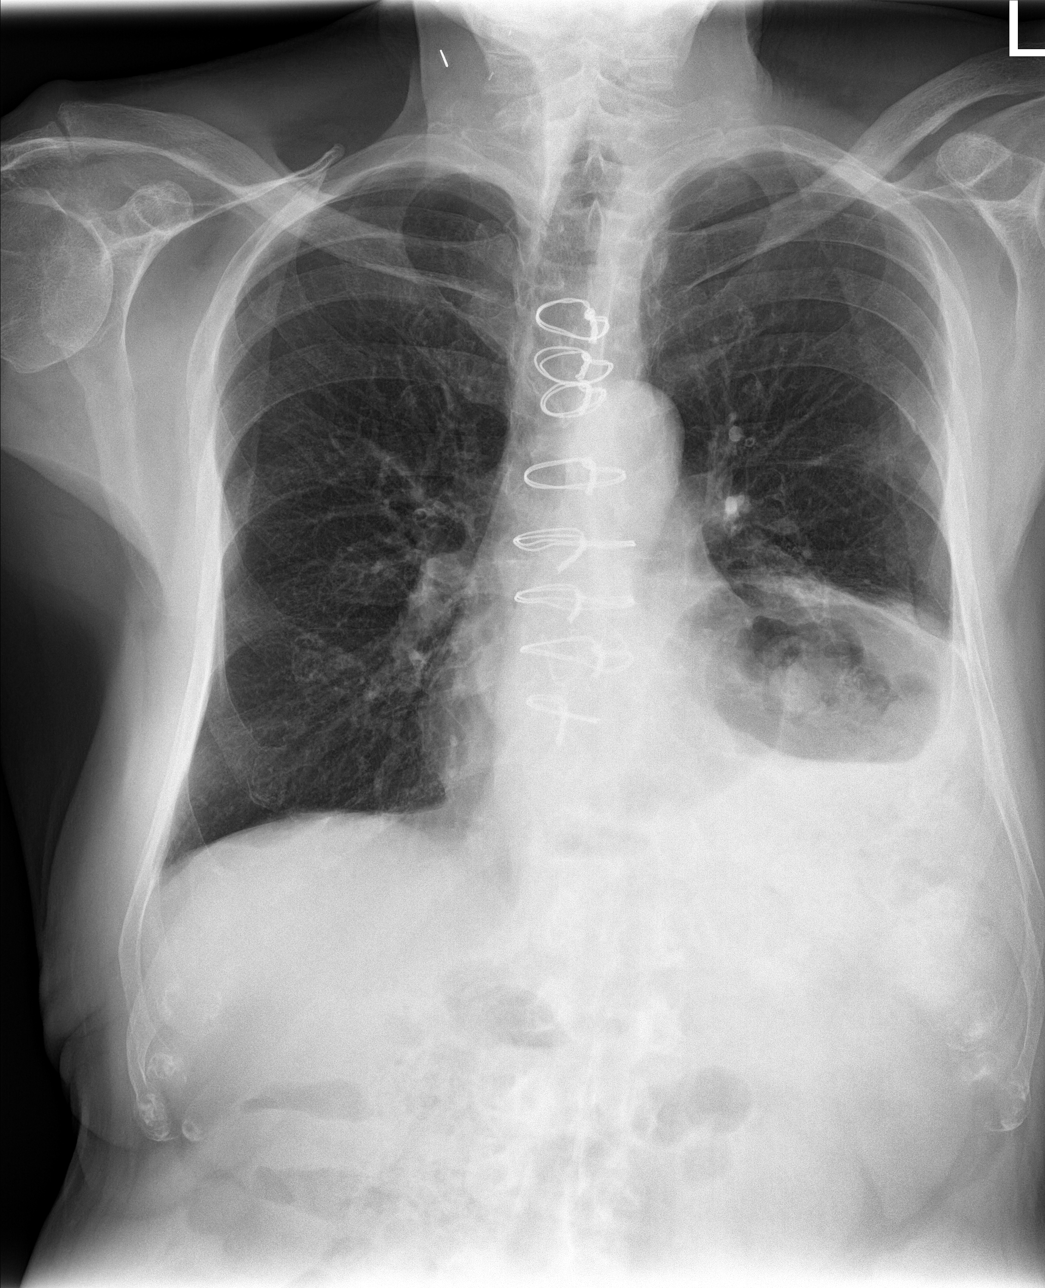

[rib obl (1 of 2)]
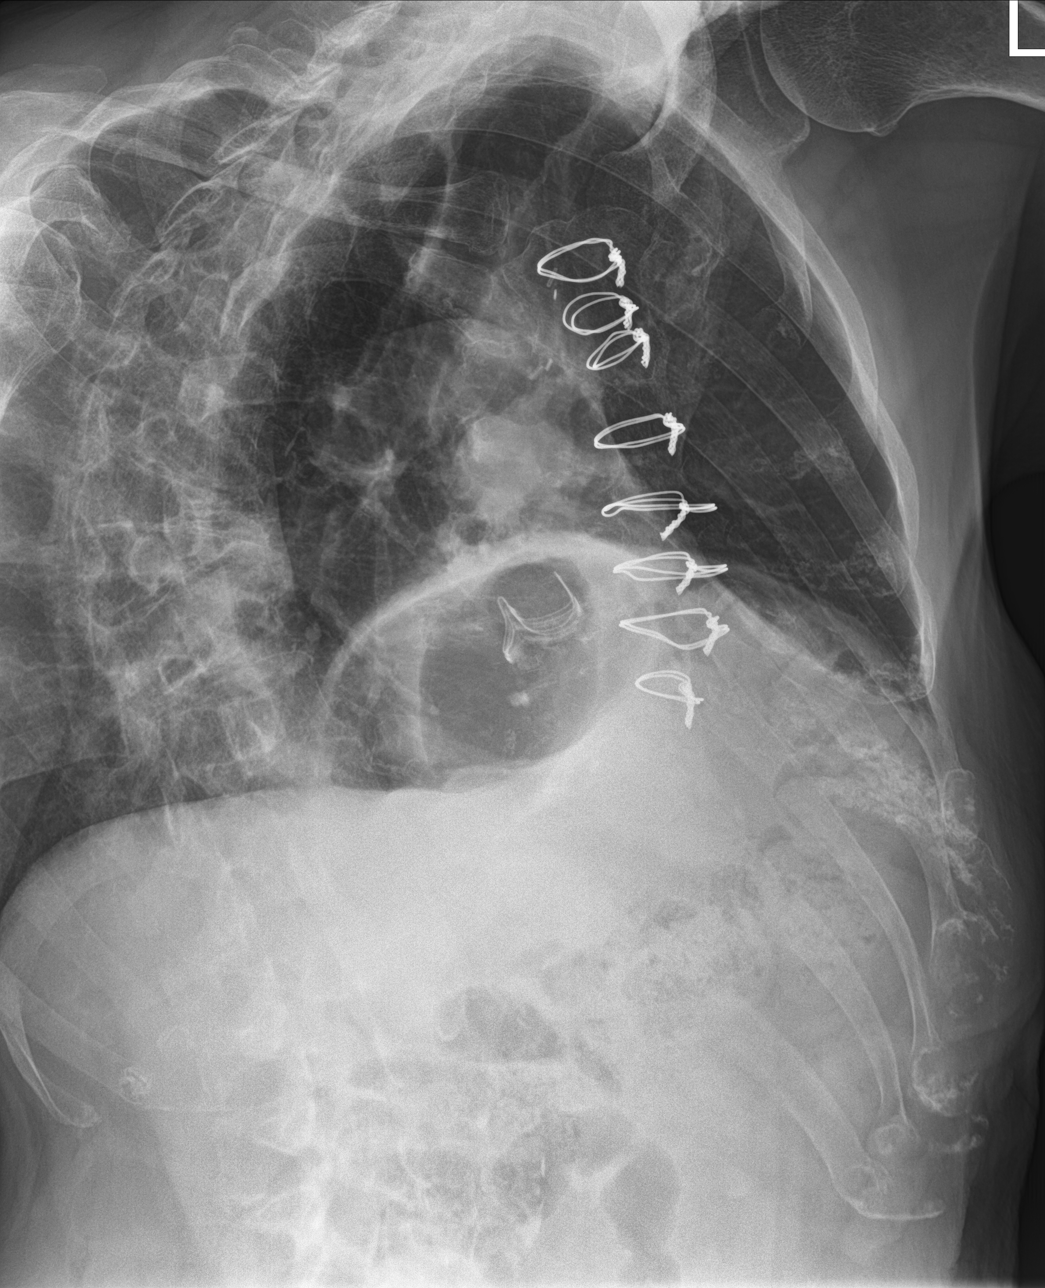

[rib obl (2 of 2)]
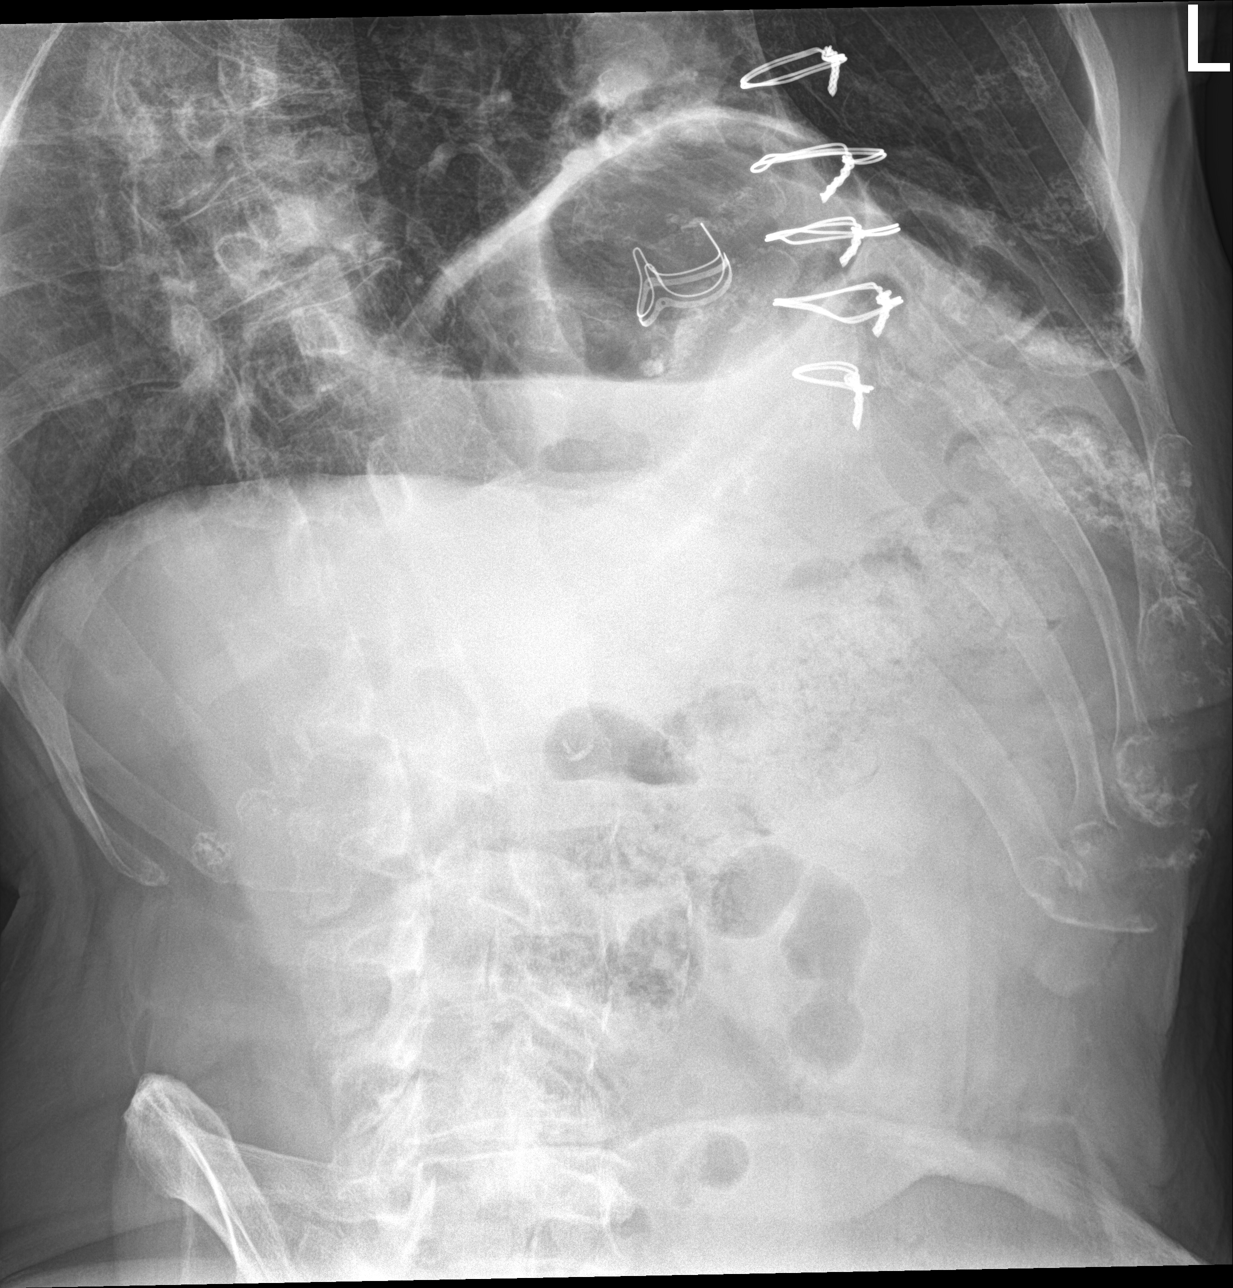

[3 of 3 positions shown; findings below may reference images not displayed]

FINDINGS: No fracture or other bone lesions are seen involving the ribs. The
patient has a chronically elevated left hemidiaphragm. Given that
the left lung base is not well aerated due to the chronic elevated
hemidiaphragm, visibility of the inferior left ribs is somewhat
compromised.

There is no evidence of pneumothorax or pleural effusion. Both lungs
are clear. Heart size and mediastinal contours are within normal
limits. Aortic valve replacement noted. Prior median sternotomy.
Cardiomediastinal silhouette appears unchanged.
IMPRESSION: No acute left rib fracture identified.

Chronic elevation of the left hemidiaphragm.

No acute findings in the chest.

## 2017-03-18 DIAGNOSIS — H524 Presbyopia: Secondary | ICD-10-CM | POA: Diagnosis not present

## 2017-03-18 DIAGNOSIS — H401133 Primary open-angle glaucoma, bilateral, severe stage: Secondary | ICD-10-CM | POA: Diagnosis not present

## 2017-03-18 DIAGNOSIS — H04123 Dry eye syndrome of bilateral lacrimal glands: Secondary | ICD-10-CM | POA: Diagnosis not present

## 2017-03-22 ENCOUNTER — Encounter: Payer: Self-pay | Admitting: Family Medicine

## 2017-03-22 ENCOUNTER — Ambulatory Visit (INDEPENDENT_AMBULATORY_CARE_PROVIDER_SITE_OTHER): Payer: Medicare Other | Admitting: Family Medicine

## 2017-03-22 VITALS — BP 148/65 | HR 62 | Temp 97.1°F | Ht 64.0 in | Wt 137.6 lb

## 2017-03-22 DIAGNOSIS — E039 Hypothyroidism, unspecified: Secondary | ICD-10-CM

## 2017-03-22 DIAGNOSIS — D696 Thrombocytopenia, unspecified: Secondary | ICD-10-CM | POA: Diagnosis not present

## 2017-03-22 DIAGNOSIS — I10 Essential (primary) hypertension: Secondary | ICD-10-CM

## 2017-03-22 NOTE — Patient Instructions (Signed)
Great to see you!  Lets follow up in 4 months unless you need us sooner.    

## 2017-03-22 NOTE — Progress Notes (Signed)
   HPI  Patient presents today with medical conditions.  Thrombocytopenia Mild, no bleeding.  Hypertension No chest pain, shortness of breath, headaches. Patient did not take his medication this morning.  He did mistakenly take 2 Synthroid this morning. Hypothyroidism- No symptoms, doing well  PMH: Smoking status noted ROS: Per HPI  Objective: BP (!) 148/65   Pulse 62   Temp (!) 97.1 F (36.2 C) (Oral)   Ht 5' 4" (1.626 m)   Wt 137 lb 9.6 oz (62.4 kg)   BMI 23.62 kg/m  Gen: NAD, alert, cooperative with exam HEENT: NCAT CV: RRR, good S1/S2, no murmur Resp: CTABL, no wheezes, non-labored Abd: SNTND, BS present, no guarding or organomegaly Ext: No edema, warm Neuro: Alert and oriented, No gross deficits  Assessment and plan:  # Hypertension Elevated today, however previously. Well-controlled No changes Patient has not taken medication today, also he accidentally took an extra Synthroid today. Labs  # Thrombocytopenia Asymptomatic, continue to monitor, repeat labs  # Hypothyroidism Previously TSH was borderline, repeat labs today Asymptomatic    Orders Placed This Encounter  Procedures  . CMP14+EGFR  . CBC with Differential/Platelet  . TSH     Sam Bradshaw, MD [facility] 03/22/2017, 10:35 AM     

## 2017-03-23 LAB — CBC WITH DIFFERENTIAL/PLATELET
BASOS ABS: 0 10*3/uL (ref 0.0–0.2)
Basos: 0 %
EOS (ABSOLUTE): 0.2 10*3/uL (ref 0.0–0.4)
Eos: 3 %
Hematocrit: 39.8 % (ref 37.5–51.0)
Hemoglobin: 14 g/dL (ref 13.0–17.7)
IMMATURE GRANULOCYTES: 0 %
Immature Grans (Abs): 0 10*3/uL (ref 0.0–0.1)
LYMPHS ABS: 1.7 10*3/uL (ref 0.7–3.1)
Lymphs: 32 %
MCH: 34 pg — AB (ref 26.6–33.0)
MCHC: 35.2 g/dL (ref 31.5–35.7)
MCV: 97 fL (ref 79–97)
MONOS ABS: 0.4 10*3/uL (ref 0.1–0.9)
Monocytes: 8 %
NEUTROS PCT: 57 %
Neutrophils Absolute: 3 10*3/uL (ref 1.4–7.0)
PLATELETS: 101 10*3/uL — AB (ref 150–379)
RBC: 4.12 x10E6/uL — AB (ref 4.14–5.80)
RDW: 13.3 % (ref 12.3–15.4)
WBC: 5.3 10*3/uL (ref 3.4–10.8)

## 2017-03-23 LAB — CMP14+EGFR
A/G RATIO: 2.3 — AB (ref 1.2–2.2)
ALK PHOS: 41 IU/L (ref 39–117)
ALT: 15 IU/L (ref 0–44)
AST: 22 IU/L (ref 0–40)
Albumin: 4.4 g/dL (ref 3.5–4.7)
BILIRUBIN TOTAL: 0.8 mg/dL (ref 0.0–1.2)
BUN / CREAT RATIO: 26 — AB (ref 10–24)
BUN: 29 mg/dL — ABNORMAL HIGH (ref 8–27)
CHLORIDE: 105 mmol/L (ref 96–106)
CO2: 25 mmol/L (ref 20–29)
CREATININE: 1.11 mg/dL (ref 0.76–1.27)
Calcium: 9 mg/dL (ref 8.6–10.2)
GFR calc Af Amer: 70 mL/min/{1.73_m2} (ref >59)
GFR calc non Af Amer: 60 mL/min/{1.73_m2} (ref >59)
GLOBULIN, TOTAL: 1.9 g/dL (ref 1.5–4.5)
Glucose: 102 mg/dL — ABNORMAL HIGH (ref 65–99)
POTASSIUM: 4.4 mmol/L (ref 3.5–5.2)
SODIUM: 141 mmol/L (ref 134–144)
Total Protein: 6.3 g/dL (ref 6.0–8.5)

## 2017-03-23 LAB — TSH: TSH: 4.34 u[IU]/mL (ref 0.450–4.500)

## 2017-04-15 ENCOUNTER — Ambulatory Visit (INDEPENDENT_AMBULATORY_CARE_PROVIDER_SITE_OTHER): Payer: Medicare Other

## 2017-04-15 DIAGNOSIS — Z23 Encounter for immunization: Secondary | ICD-10-CM

## 2017-06-30 ENCOUNTER — Other Ambulatory Visit: Payer: Self-pay | Admitting: Cardiology

## 2017-07-13 NOTE — Progress Notes (Signed)
HPI: FU AVR (h/o AS); hx of carotid stenosis s/p CEA, HTN, HL. He is intol to statins. Echo 1/15 with normal LV function and severe AS (mean 52). LHC demonstrated non-obstructive CAD. Had AVR 3/15. Postoperative course was complicated by atrial fibrillation. He converted to NSR on amiodarone.  Studies: - LHC (07/26/13): Left Main calcified, proximal mid and distal LAD 50, circumflex and RCA patent, EF 60%.   - Carotid US (1/18): 1-39 bilateral ICA stenosis.   - Echocardiogram postoperatively in April of 2015 showed normal LV function, grade 1 diastolic dysfunction, bioprosthetic aortic valve with a mean gradient of 9 mm of mercury. There was mild mitral regurgitation and mild left atrial enlargement.   Since last seen, patient denies dyspnea, chest pain, palpitations or syncope.  Current Outpatient Medications  Medication Sig Dispense Refill  . aspirin 81 MG tablet Take 81 mg by mouth daily.    . dorzolamide (TRUSOPT) 2 % ophthalmic solution Place 1 drop into the left eye 3 (three) times daily.   3  . ezetimibe (ZETIA) 10 MG tablet Take 1 tablet (10 mg total) by mouth daily. TAKE 1 TABLET (10 MG TOTAL) BY MOUTH DAILY. 30 tablet 11  . levothyroxine (SYNTHROID, LEVOTHROID) 50 MCG tablet TAKE ONE TABLET BY MOUTH ONCE DAILY 90 tablet 3  . metoprolol tartrate (LOPRESSOR) 50 MG tablet TAKE ONE-HALF TABLET BY MOUTH TWICE DAILY 90 tablet 2  . Multiple Vitamins-Minerals (CENTRUM SILVER PO) Take by mouth.    . Niacin CR 1000 MG TBCR TAKE ONE TABLET BY MOUTH ONCE DAILY 90 tablet 3  . ramipril (ALTACE) 5 MG capsule Take 1 capsule (5 mg total) by mouth daily. 90 capsule 5  . TIMOLOL MALEATE OP Place 1 drop into the left eye 2 (two) times daily. LEFT EYE     . Travoprost, BAK Free, (TRAVATAN) 0.004 % SOLN ophthalmic solution Place 1 drop into the left eye at bedtime.      No current facility-administered medications for this visit.      Past Medical History:  Diagnosis Date  . Aortic  stenosis    a. s/p tissue AVR 08/2013;  b. Echo (09/2013):  Mild LVH, EF 55-60%, no RWMA, Gr 1 DD, AVR ok (mean gradient 9 mmHg), mild MR, mild LAE, mildly reduced RVSF, mild TR, PASP 32 mmHg.  Marland Kitchen CAD (coronary artery disease)   . Cerebrovascular disease, unspecified   . Glaucoma    Bilateral eyes  . HLD (hyperlipidemia)   . HOH (hard of hearing)    wears bilateral hearing aids  . HTN (hypertension)   . Pneumonia July 23, 2013  . S/P aortic valve replacement with bioprosthetic valve 09/13/2013   25 mm [facility] Ease bovine bioprosthetic tissue valve  . Skin cancer of face    removed from nose    Past Surgical History:  Procedure Laterality Date  . AORTIC VALVE REPLACEMENT N/A 09/13/2013   Procedure: AORTIC VALVE REPLACEMENT (AVR);  Surgeon: Rexene Alberts, MD;  Location: [place];  Service: Open Heart Surgery;  Laterality: N/A;  . CARDIAC CATHETERIZATION     no PCI  . CAROTID ENDARTERECTOMY Right   . CATARACT EXTRACTION Bilateral   . COLONOSCOPY W/ POLYPECTOMY    . EYE SURGERY    . INTRAOPERATIVE TRANSESOPHAGEAL ECHOCARDIOGRAM N/A 09/13/2013   Procedure: INTRAOPERATIVE TRANSESOPHAGEAL ECHOCARDIOGRAM;  Surgeon: Rexene Alberts, MD;  Location: [place];  Service: Open Heart Surgery;  Laterality: N/A;  . LEFT AND RIGHT HEART CATHETERIZATION WITH  CORONARY ANGIOGRAM N/A 07/26/2013   Procedure: LEFT AND RIGHT HEART CATHETERIZATION WITH CORONARY ANGIOGRAM;  Surgeon: Sinclair Grooms, MD;  Location: [facility] CATH LAB;  Service: Cardiovascular;  Laterality: N/A;  . RETINAL DETACHMENT SURGERY      Social History   Socioeconomic History  . Marital status: Married    Spouse name: Not on file  . Number of children: Not on file  . Years of education: Not on file  . Highest education level: Not on file  Social Needs  . Financial resource strain: Not on file  . Food insecurity - worry: Not on file  . Food insecurity - inability: Not on file  . Transportation needs - medical: Not on file  .  Transportation needs - non-medical: Not on file  Occupational History  . Not on file  Tobacco Use  . Smoking status: Never Smoker  . Smokeless tobacco: Never Used  . Tobacco comment: tobacco use - no  Substance and Sexual Activity  . Alcohol use: No    Alcohol/week: 0.0 oz  . Drug use: No  . Sexual activity: No  Other Topics Concern  . Not on file  Social History Narrative   Married.     Family History  Problem Relation Age of Onset  . Colon cancer Father   . Heart failure Mother        CHF   . Birth defects Sister     ROS: decreased vision and hearing but no fevers or chills, productive cough, hemoptysis, dysphasia, odynophagia, melena, hematochezia, dysuria, hematuria, rash, seizure activity, orthopnea, PND, pedal edema, claudication. Remaining systems are negative.  Physical Exam: Well-developed well-nourished in no acute distress.  Skin is warm and dry.  HEENT is normal.  Neck is supple.  Chest is clear to auscultation with normal expansion.  Cardiovascular exam is regular rate and rhythm.  Abdominal exam nontender or distended. No masses palpated. Extremities show no edema. neuro grossly intact  ECG- sinus rhythm at a rate of 54. Incomplete left bundle branch block. personally reviewed  A/P  1 coronary artery disease-patient is intolerant to statins. Continue aspirin.  2 prior aortic valve replacement-no new symptoms. Continue SBE prophylaxis.  3 carotid artery disease-schedule follow-up carotid Dopplers.  4 hypertension-what pressure is controlled. Continue present medications.  5 hyperlipidemia-intolerant to statins. Continue Zetia and diet. Lipids and liver monitored by primary care.  Kirk Ruths, MD

## 2017-07-16 ENCOUNTER — Encounter: Payer: Self-pay | Admitting: Cardiology

## 2017-07-16 ENCOUNTER — Ambulatory Visit: Payer: Medicare Other | Admitting: Cardiology

## 2017-07-16 VITALS — BP 124/68 | HR 54 | Ht 66.0 in | Wt 140.0 lb

## 2017-07-16 DIAGNOSIS — I359 Nonrheumatic aortic valve disorder, unspecified: Secondary | ICD-10-CM

## 2017-07-16 DIAGNOSIS — E7800 Pure hypercholesterolemia, unspecified: Secondary | ICD-10-CM | POA: Diagnosis not present

## 2017-07-16 DIAGNOSIS — I679 Cerebrovascular disease, unspecified: Secondary | ICD-10-CM | POA: Diagnosis not present

## 2017-07-16 DIAGNOSIS — I2510 Atherosclerotic heart disease of native coronary artery without angina pectoris: Secondary | ICD-10-CM

## 2017-07-16 MED ORDER — NIACIN ER 1000 MG PO TBCR: 1.0000 | EXTENDED_RELEASE_TABLET | Freq: Every day | ORAL | 3 refills | Status: DC

## 2017-07-16 MED ORDER — EZETIMIBE 10 MG PO TABS: 10.0000 mg | ORAL_TABLET | Freq: Every day | ORAL | 3 refills | Status: DC

## 2017-07-16 NOTE — Patient Instructions (Signed)
Medication Instructions:   NO CHANGE  Testing/Procedures:  Your physician has requested that you have a carotid duplex. This test is an ultrasound of the carotid arteries in your neck. It looks at blood flow through these arteries that supply the brain with blood. Allow one hour for this exam. There are no restrictions or special instructions.    Follow-Up:  Your physician wants you to follow-up in: ONE YEAR WITH DR CRENSHAW You will receive a reminder letter in the mail two months in advance. If you don't receive a letter, please call our office to schedule the follow-up appointment.   If you need a refill on your cardiac medications before your next appointment, please call your pharmacy.    

## 2017-07-22 ENCOUNTER — Ambulatory Visit (HOSPITAL_COMMUNITY)
Admission: RE | Admit: 2017-07-22 | Payer: Medicare Other | Source: Ambulatory Visit | Attending: Cardiology | Admitting: Cardiology

## 2017-08-05 ENCOUNTER — Ambulatory Visit (HOSPITAL_COMMUNITY)
Admission: RE | Admit: 2017-08-05 | Payer: Medicare Other | Source: Ambulatory Visit | Attending: Cardiology | Admitting: Cardiology

## 2017-08-13 ENCOUNTER — Encounter: Payer: Self-pay | Admitting: Family Medicine

## 2017-08-13 ENCOUNTER — Ambulatory Visit: Payer: Medicare Other | Admitting: Family Medicine

## 2017-08-13 VITALS — BP 126/69 | HR 74 | Temp 97.1°F | Ht 66.0 in | Wt 141.0 lb

## 2017-08-13 DIAGNOSIS — I771 Stricture of artery: Secondary | ICD-10-CM | POA: Diagnosis not present

## 2017-08-13 DIAGNOSIS — L89151 Pressure ulcer of sacral region, stage 1: Secondary | ICD-10-CM

## 2017-08-13 DIAGNOSIS — I739 Peripheral vascular disease, unspecified: Secondary | ICD-10-CM

## 2017-08-13 DIAGNOSIS — L98499 Non-pressure chronic ulcer of skin of other sites with unspecified severity: Secondary | ICD-10-CM

## 2017-08-13 DIAGNOSIS — R600 Localized edema: Secondary | ICD-10-CM | POA: Diagnosis not present

## 2017-08-13 MED ORDER — ZINC OXIDE 40 % EX OINT: 1.0000 "application " | TOPICAL_OINTMENT | CUTANEOUS | 0 refills | Status: DC | PRN

## 2017-08-13 MED ORDER — ZINC OXIDE 40 % EX OINT
1.0000 | TOPICAL_OINTMENT | CUTANEOUS | 0 refills | Status: DC | PRN
Start: 2017-08-13 — End: 2017-08-13

## 2017-08-13 NOTE — Progress Notes (Signed)
BP 126/69   Pulse 74   Temp (!) 97.1 F (36.2 C) (Oral)   Ht 5\' 6"  (1.676 m)   Wt 141 lb (64 kg)   BMI 22.76 kg/m    Subjective:    Patient ID: Pervis Hocking, male    DOB: [birth year], 84 y.o.   MRN: [id number]  HPI: NIKOLA MARONE is a 84 y.o. male presenting on 08/13/2017 for Edema in feet and ankles (had a fall on 07/22/17, since then he has been sore and has been doing a lot of sitting around and has began to have edema)   HPI  Pt had fall 3 weeks ago where he fell on his buttocks on the cement. He presents today with bilateral swelling in feet and ankles. C/o also of a "blister" on the inside of his left lateral malleolus that was just noticed last evening the blister has burst open last evening and has been draining clear fluid.  Swelling of ankles has become worse over the last few days, as he has been sitting around more frequently and sleeping sititing up in a recliner, he has been trying to keep his feet up but his wife does say that he frequently brings him down because he says it hurts his knees.  His legs have been sore because of all the swelling to touch.  Patient feels like the swelling has worsened and has had to wear his wife shoes.  Also c/o recent buttocks skin thinning and redness and soreness on lower back r/t fall. He has no SOB, no chest pain.  Has not started any new medications recently.  Patient does also say that his toes have been more purple and discolored over the past few weeks and they will normally are as well.  He does admit that he is been told he has blood flow problems to his legs previously.  Relevant past medical, surgical, family and social history reviewed and updated as indicated. Interim medical history since our last visit reviewed. Allergies and medications reviewed and updated.  Review of Systems  Respiratory: Negative for cough, chest tightness, shortness of breath and wheezing.   Cardiovascular: Positive for leg swelling. Negative for chest pain.    Musculoskeletal: Positive for back pain and myalgias.  Skin: Positive for color change and wound.  Neurological: Negative for dizziness, weakness, light-headedness and numbness.  Psychiatric/Behavioral: Positive for confusion (The patient has known confusion and memory issues and his wife says he is at baseline).    Per HPI unless specifically indicated above   Allergies as of 08/13/2017      Reactions   Levaquin [levofloxacin]    RASH      Medication List        Accurate as of 08/13/17  5:12 PM. Always use your most recent med list.          aspirin 81 MG tablet Take 81 mg by mouth daily.   CENTRUM SILVER PO Take by mouth.   dorzolamide 2 % ophthalmic solution Commonly known as:  TRUSOPT Place 1 drop into the left eye 3 (three) times daily.   ezetimibe 10 MG tablet Commonly known as:  ZETIA Take 1 tablet (10 mg total) by mouth daily. TAKE 1 TABLET (10 MG TOTAL) BY MOUTH DAILY.   levothyroxine 50 MCG tablet Commonly known as:  SYNTHROID, LEVOTHROID TAKE ONE TABLET BY MOUTH ONCE DAILY   liver oil-zinc oxide 40 % ointment Commonly known as:  BOUDREAUXS BUTT PASTE Apply 1 application topically  as needed for irritation.   metoprolol tartrate 50 MG tablet Commonly known as:  LOPRESSOR TAKE ONE-HALF TABLET BY MOUTH TWICE DAILY   Niacin CR 1000 MG Tbcr Take 1 tablet (1,000 mg total) by mouth daily.   ramipril 5 MG capsule Commonly known as:  ALTACE Take 1 capsule (5 mg total) by mouth daily.   TIMOLOL MALEATE OP Place 1 drop into the left eye 2 (two) times daily. LEFT EYE   Travoprost (BAK Free) 0.004 % Soln ophthalmic solution Commonly known as:  TRAVATAN Place 1 drop into the left eye at bedtime.          Objective:    BP 126/69   Pulse 74   Temp (!) 97.1 F (36.2 C) (Oral)   Ht 5\' 6"  (1.676 m)   Wt 141 lb (64 kg)   BMI 22.76 kg/m   Wt Readings from Last 3 Encounters:  08/13/17 141 lb (64 kg)  07/16/17 140 lb (63.5 kg)  03/22/17 137 lb 9.6 oz  (62.4 kg)    Physical Exam  Constitutional: He is oriented to person, place, and time. He appears well-developed and well-nourished. No distress.  HENT:  Right Ear: Decreased hearing is noted.  Eyes: Conjunctivae are normal. No scleral icterus.  Cardiovascular: Normal rate, regular rhythm, normal heart sounds and intact distal pulses. Exam reveals decreased pulses.  No murmur heard. Distal pulses present with doppler US.   Pulmonary/Chest: Effort normal and breath sounds normal. No respiratory distress. He has no wheezes. He has no rhonchi. He has no rales.  Musculoskeletal: Normal range of motion. He exhibits edema (3+ pitting edema).  Bilateral toes purple on appearance. No hair growth. Pitting edema 3+. Arterial ulcer on left lateral malleolus.  Neurological: He is alert and oriented to person, place, and time. Coordination normal.  Skin: Skin is warm and dry. No rash noted. He is not diaphoretic.  Stage 1 pressure ulcer on buttocks.  Psychiatric: He has a normal mood and affect. His behavior is normal.  Nursing note and vitals reviewed.     Assessment & Plan:   Problem List Items Addressed This Visit    None    Visit Diagnoses    Bilateral lower extremity edema    -  Primary   Relevant Orders   US Venous Img Lower Bilateral   PAD (peripheral artery disease) (HCC)       Able to obtain pulses with hand Doppler   Arterial insufficiency with ischemic ulcer (HCC)       Stage I pressure ulcer of sacral region       Relevant Medications   liver oil-zinc oxide (BOUDREAUXS BUTT PASTE) 40 % ointment    Patient seen and examined with Chaney Malling PA student. Unable to obtain ultrasound today but will have patient go on Monday and given the warning signs of what to watch for for PE if it should arise.  For now keep his feet elevated but if ultrasound for DVT is negative then we will do compression stockings.  Instructed patient to do wet to dry dressing using Xeroform gauze on  ulcer of his left ankle.  Instructed patient to use Butt paste on his buttocks daily until healed  Caryl Pina, MD [place] 08/13/2017, 6:47 PM     Follow up plan: Return if symptoms worsen or fail to improve.  Counseling provided for all of the vaccine components Orders Placed This Encounter  Procedures  . US Venous Img Lower Bilateral

## 2017-08-18 ENCOUNTER — Ambulatory Visit (HOSPITAL_COMMUNITY)
Admission: RE | Admit: 2017-08-18 | Discharge: 2017-08-18 | Disposition: A | Discharge status: Home / Self Care | Payer: Medicare Other | Source: Ambulatory Visit | Attending: Family Medicine | Admitting: Family Medicine

## 2017-08-18 DIAGNOSIS — R600 Localized edema: Secondary | ICD-10-CM | POA: Diagnosis not present

## 2017-08-19 ENCOUNTER — Telehealth: Payer: Self-pay | Admitting: Family Medicine

## 2017-08-20 ENCOUNTER — Ambulatory Visit: Payer: Medicare Other | Admitting: Physician Assistant

## 2017-08-20 ENCOUNTER — Encounter: Payer: Self-pay | Admitting: Physician Assistant

## 2017-08-20 VITALS — BP 125/60 | HR 69 | Temp 98.5°F | Ht 66.0 in | Wt 144.0 lb

## 2017-08-20 DIAGNOSIS — L03119 Cellulitis of unspecified part of limb: Secondary | ICD-10-CM

## 2017-08-20 DIAGNOSIS — R609 Edema, unspecified: Secondary | ICD-10-CM

## 2017-08-20 MED ORDER — CEPHALEXIN 500 MG PO CAPS: 500.0000 mg | ORAL_CAPSULE | Freq: Three times a day (TID) | ORAL | 0 refills | Status: DC

## 2017-08-20 MED ORDER — HYDROCHLOROTHIAZIDE 25 MG PO TABS: 25.0000 mg | ORAL_TABLET | Freq: Every day | ORAL | 3 refills | Status: DC

## 2017-08-20 NOTE — Telephone Encounter (Signed)
Patient and his family requesting compression stocking prescription.  This is likely perfectly fine, however PAD has been noted in a recent note, reviewed his chart thoroughly and I do not see any evidence or evaluation that would prove PAD.  He has had coronary artery stenosis, aortic valve replacement, and CAD but no specific peripheral arterial disease evaluated.  15-20 mmHg knee-high stockings  Laroy Apple, MD [place] 08/20/2017, 11:29 AM

## 2017-08-20 NOTE — Telephone Encounter (Signed)
Wife aware

## 2017-08-23 NOTE — Progress Notes (Signed)
BP 125/60 (BP Location: Left Arm, Patient Position: Sitting, Cuff Size: Normal)   Pulse 69   Temp 98.5 F (36.9 C) (Oral)   Ht 5\' 6"  (1.676 m)   Wt 144 lb (65.3 kg)   BMI 23.24 kg/m    Subjective:    Patient ID: [name], male    DOB: 07-19-1929, 84 y.o.   MRN: [id number]  HPI: [name] is a 84 y.o. male presenting on 08/20/2017 for Ankle Pain (left ankle pain and abrasion after fall 4 weeks ago)  Patient comes in with increased swelling in his lower ankles.  He had a little bit of a fall and had a scrape on his leg.  It has begun to be more swollen and red.  He denies any fever or chills at this time.  His edema has been more pronounced.  He does have some mild compression stockings that he had not been wearing.  We discussed putting a Telfa over his lesion and to use the compression stocking.  Past Medical History:  Diagnosis Date  . Aortic stenosis    a. s/p tissue AVR 08/2013;  b. Echo (09/2013):  Mild LVH, EF 55-60%, no RWMA, Gr 1 DD, AVR ok (mean gradient 9 mmHg), mild MR, mild LAE, mildly reduced RVSF, mild TR, PASP 32 mmHg.  Marland Kitchen CAD (coronary artery disease)   . Cerebrovascular disease, unspecified   . Glaucoma    Bilateral eyes  . HLD (hyperlipidemia)   . HOH (hard of hearing)    wears bilateral hearing aids  . HTN (hypertension)   . Pneumonia July 23, 2013  . S/P aortic valve replacement with bioprosthetic valve 09/13/2013   25 mm [facility] Ease bovine bioprosthetic tissue valve  . Skin cancer of face    removed from nose   Relevant past medical, surgical, family and social history reviewed and updated as indicated. Interim medical history since our last visit reviewed. Allergies and medications reviewed and updated. DATA REVIEWED: CHART IN EPIC  Family History reviewed for pertinent findings.  Review of Systems  Constitutional: Negative.  Negative for appetite change and fatigue.  Eyes: Negative for pain and visual disturbance.  Respiratory: Negative.   Negative for cough, chest tightness, shortness of breath and wheezing.   Cardiovascular: Positive for leg swelling. Negative for chest pain and palpitations.  Gastrointestinal: Negative.  Negative for abdominal pain, diarrhea, nausea and vomiting.  Genitourinary: Negative.   Skin: Positive for color change and rash.  Neurological: Negative.  Negative for weakness, numbness and headaches.  Psychiatric/Behavioral: Negative.     Allergies as of 08/20/2017      Reactions   Levaquin [levofloxacin]    RASH      Medication List        Accurate as of 08/20/17 11:59 PM. Always use your most recent med list.          aspirin 81 MG tablet Take 81 mg by mouth daily.   CENTRUM SILVER PO Take by mouth.   cephALEXin 500 MG capsule Commonly known as:  KEFLEX Take 1 capsule (500 mg total) by mouth 3 (three) times daily.   dorzolamide 2 % ophthalmic solution Commonly known as:  TRUSOPT Place 1 drop into the left eye 3 (three) times daily.   ezetimibe 10 MG tablet Commonly known as:  ZETIA Take 1 tablet (10 mg total) by mouth daily. TAKE 1 TABLET (10 MG TOTAL) BY MOUTH DAILY.   hydrochlorothiazide 25 MG tablet Commonly known as:  HYDRODIURIL Take 1 tablet (25 mg total) by mouth daily.   levothyroxine 50 MCG tablet Commonly known as:  SYNTHROID, LEVOTHROID TAKE ONE TABLET BY MOUTH ONCE DAILY   liver oil-zinc oxide 40 % ointment Commonly known as:  BOUDREAUXS BUTT PASTE Apply 1 application topically as needed for irritation.   metoprolol tartrate 50 MG tablet Commonly known as:  LOPRESSOR TAKE ONE-HALF TABLET BY MOUTH TWICE DAILY   Niacin CR 1000 MG Tbcr Take 1 tablet (1,000 mg total) by mouth daily.   ramipril 5 MG capsule Commonly known as:  ALTACE Take 1 capsule (5 mg total) by mouth daily.   TIMOLOL MALEATE OP Place 1 drop into the left eye 2 (two) times daily. LEFT EYE   Travoprost (BAK Free) 0.004 % Soln ophthalmic solution Commonly known as:  TRAVATAN Place 1 drop  into the left eye at bedtime.          Objective:    BP 125/60 (BP Location: Left Arm, Patient Position: Sitting, Cuff Size: Normal)   Pulse 69   Temp 98.5 F (36.9 C) (Oral)   Ht 5\' 6"  (1.676 m)   Wt 144 lb (65.3 kg)   BMI 23.24 kg/m   Allergies  Allergen Reactions  . Levaquin [Levofloxacin]     RASH    Wt Readings from Last 3 Encounters:  08/20/17 144 lb (65.3 kg)  08/13/17 141 lb (64 kg)  07/16/17 140 lb (63.5 kg)    Physical Exam  Constitutional: He appears well-developed and well-nourished. No distress.  HENT:  Head: Normocephalic and atraumatic.  Eyes: Conjunctivae and EOM are normal. Pupils are equal, round, and reactive to light.  Cardiovascular: Normal rate, regular rhythm and normal heart sounds.  Pulmonary/Chest: Effort normal and breath sounds normal. No respiratory distress.  Skin: Skin is warm and dry. There is erythema.     Psychiatric: He has a normal mood and affect. His behavior is normal.  Nursing note and vitals reviewed.       Assessment & Plan:   1. Cellulitis of lower extremity, unspecified laterality - cephALEXin (KEFLEX) 500 MG capsule; Take 1 capsule (500 mg total) by mouth 3 (three) times daily.  Dispense: 30 capsule; Refill: 0  2. Edema, unspecified type - hydrochlorothiazide (HYDRODIURIL) 25 MG tablet; Take 1 tablet (25 mg total) by mouth daily.  Dispense: 30 tablet; Refill: 3   Continue all other maintenance medications as listed above.  Follow up plan: Return in about 1 week (around 08/27/2017) for bradshaw.  Educational handout given for [place] PA-C [place] [address]  [place], [place] [address] [phone]   08/23/2017, 10:36 PM

## 2017-08-27 ENCOUNTER — Ambulatory Visit: Payer: Medicare Other | Admitting: Family Medicine

## 2017-08-27 ENCOUNTER — Encounter: Payer: Self-pay | Admitting: Family Medicine

## 2017-08-27 VITALS — BP 121/69 | HR 59 | Temp 97.0°F | Ht 66.0 in | Wt 139.4 lb

## 2017-08-27 DIAGNOSIS — R609 Edema, unspecified: Secondary | ICD-10-CM

## 2017-08-27 DIAGNOSIS — S81802D Unspecified open wound, left lower leg, subsequent encounter: Secondary | ICD-10-CM | POA: Diagnosis not present

## 2017-08-27 MED ORDER — HYDROCHLOROTHIAZIDE 25 MG PO TABS: 25.0000 mg | ORAL_TABLET | Freq: Every day | ORAL | 3 refills | Status: DC

## 2017-08-27 NOTE — Progress Notes (Signed)
   HPI  Patient presents today here for follow-up of cellulitis.  Patient has a wound on his left lower leg/posterior ankle on the medial side.  He states that he fell on concrete around January 22 and that "he does not know how this happened".  I believe that he is meaning that he injured it during the fall.  Patient states that he has less pain in the sore now. His swelling seems about the same, he is tried a compression stocking on the unaffected leg but could not tolerated on the affected leg.  His wife states that he has been sleeping in the recliner recently, he will try to sleep again with his feet elevated  PMH: Smoking status noted ROS: Per HPI  Objective: BP 121/69   Pulse (!) 59   Temp (!) 97 F (36.1 C) (Oral)   Ht 5\' 6"  (1.676 m)   Wt 139 lb 6.4 oz (63.2 kg)   BMI 22.50 kg/m  Gen: NAD, alert, cooperative with exam HEENT: NCAT, EOMI, PERRL CV: RRR, good S1/S2, no murmur Resp: CTABL, no wheezes, non-labored Ext: Plus pitting edema bilaterally, 1+ symmetric dorsalis pedis pulses bilaterally, brisk cap refill Neuro: Alert and oriented, No gross deficits  Skin Left medial ankle with 1.9 x 1.6 sharply marginated lesion with heme crusting picture below, no significant erythema or tenderness to palpation.       Assessment and plan:  #Left lower extremity wound No signs of infection Follow-up 2 weeks Supportive care only for now.   #Edema Likely venous stasis Elevate feet, continue HCTZ We will push harder for compression stockings when his lesion is healed     Meds ordered this encounter  Medications  . hydrochlorothiazide (HYDRODIURIL) 25 MG tablet    Sig: Take 1 tablet (25 mg total) by mouth daily.    Dispense:  30 tablet    Refill:  Randall, MD [place] 08/27/2017, 11:52 AM

## 2017-08-27 NOTE — Patient Instructions (Signed)
Great to see you!  Come back in 2 weeks to check on the wound

## 2017-09-10 ENCOUNTER — Ambulatory Visit: Payer: Medicare Other | Admitting: Family Medicine

## 2017-09-10 ENCOUNTER — Encounter: Payer: Self-pay | Admitting: Family Medicine

## 2017-09-10 VITALS — BP 117/62 | HR 55 | Temp 96.8°F | Ht 66.0 in | Wt 137.6 lb

## 2017-09-10 DIAGNOSIS — S81802D Unspecified open wound, left lower leg, subsequent encounter: Secondary | ICD-10-CM

## 2017-09-10 DIAGNOSIS — R609 Edema, unspecified: Secondary | ICD-10-CM

## 2017-09-10 MED ORDER — HYDROCHLOROTHIAZIDE 25 MG PO TABS: 25.0000 mg | ORAL_TABLET | ORAL | 3 refills | Status: DC

## 2017-09-10 NOTE — Progress Notes (Signed)
   HPI  Patient presents today here today for wound follow up.   PT wants tio change to every other day HCTZ,  The wound has healed, He tolerated keflex well, but took BID.   Stockings are difficult to get on.   Edema is improved.    PMH: Smoking status noted ROS: Per HPI  Objective: BP 117/62   Pulse (!) 55   Temp (!) 96.8 F (36 C) (Oral)   Ht 5\' 6"  (1.676 m)   Wt 137 lb 9.6 oz (62.4 kg)   BMI 22.21 kg/m  Gen: NAD, alert, cooperative with exam HEENT: NCAT CV: RRR, good S1/S2, no murmur Resp: CTABL, no wheezes, non-labored Ext: no edema, Cool non painful blue discolored LE to mid foot, maintains 1+ DP pulse and less than 3 sec cap refill.  Neuro: Alert and oriented, No gross deficits  Assessment and plan:  # LLE wound- resolved  # Edema Ok to decrease HCTZ to every other day Follow up in 2-3 months Compression stockings if tolerated.   Gregory Apple, MD [facility] 09/10/2017, 10:58 AM

## 2017-09-10 NOTE — Patient Instructions (Signed)
Great to see you!  You can be finished with antibiotics  You can try every other day fluid pill ( hydrochlorothiazide)  Try compression stockings if he can tolerate them.

## 2017-09-13 ENCOUNTER — Telehealth: Payer: Self-pay | Admitting: Family Medicine

## 2017-09-13 MED ORDER — LEVOTHYROXINE SODIUM 50 MCG PO TABS: 50.0000 ug | ORAL_TABLET | Freq: Every day | ORAL | 1 refills | Status: DC

## 2017-09-13 NOTE — Telephone Encounter (Signed)
Refill sent to [facility] 

## 2017-09-20 ENCOUNTER — Ambulatory Visit: Payer: Medicare Other | Admitting: Family Medicine

## 2017-09-24 ENCOUNTER — Other Ambulatory Visit: Payer: Self-pay | Admitting: Cardiology

## 2017-09-24 DIAGNOSIS — I10 Essential (primary) hypertension: Secondary | ICD-10-CM

## 2017-10-25 ENCOUNTER — Ambulatory Visit (HOSPITAL_COMMUNITY)
Admission: RE | Admit: 2017-10-25 | Discharge: 2017-10-25 | Disposition: A | Discharge status: Home / Self Care | Payer: Medicare Other | Source: Ambulatory Visit | Attending: Cardiovascular Disease | Admitting: Cardiovascular Disease

## 2017-10-25 DIAGNOSIS — I679 Cerebrovascular disease, unspecified: Secondary | ICD-10-CM | POA: Diagnosis not present

## 2017-11-04 ENCOUNTER — Other Ambulatory Visit: Payer: Self-pay | Admitting: Cardiology

## 2017-11-04 DIAGNOSIS — I2510 Atherosclerotic heart disease of native coronary artery without angina pectoris: Secondary | ICD-10-CM

## 2017-11-04 DIAGNOSIS — Z953 Presence of xenogenic heart valve: Secondary | ICD-10-CM

## 2017-11-04 DIAGNOSIS — I2583 Coronary atherosclerosis due to lipid rich plaque: Secondary | ICD-10-CM

## 2017-11-04 DIAGNOSIS — I679 Cerebrovascular disease, unspecified: Secondary | ICD-10-CM

## 2017-11-04 DIAGNOSIS — I10 Essential (primary) hypertension: Secondary | ICD-10-CM

## 2017-11-04 NOTE — Telephone Encounter (Signed)
Rx request sent to pharmacy.  

## 2017-12-03 ENCOUNTER — Other Ambulatory Visit: Payer: Self-pay | Admitting: *Deleted

## 2017-12-03 DIAGNOSIS — I679 Cerebrovascular disease, unspecified: Secondary | ICD-10-CM

## 2017-12-13 ENCOUNTER — Encounter: Payer: Self-pay | Admitting: Family Medicine

## 2017-12-13 ENCOUNTER — Ambulatory Visit (INDEPENDENT_AMBULATORY_CARE_PROVIDER_SITE_OTHER): Payer: Medicare Other | Admitting: Family Medicine

## 2017-12-13 VITALS — BP 105/60 | HR 55 | Temp 98.6°F | Ht 66.0 in | Wt 139.0 lb

## 2017-12-13 DIAGNOSIS — E039 Hypothyroidism, unspecified: Secondary | ICD-10-CM

## 2017-12-13 DIAGNOSIS — I10 Essential (primary) hypertension: Secondary | ICD-10-CM

## 2017-12-13 DIAGNOSIS — E785 Hyperlipidemia, unspecified: Secondary | ICD-10-CM

## 2017-12-13 NOTE — Patient Instructions (Signed)
Great to see you!  Come back to see Dr. Livia Snellen in 4 months

## 2017-12-13 NOTE — Progress Notes (Signed)
   HPI  Patient presents today for follow-up chronic medical conditions.  He has slowly stopped taking HCTZ, he has done well without it. He denies headache or chest pain.  No leg swelling.  Patient reports a good appetite.   PMH: Smoking status noted ROS: Per HPI  Objective: BP 105/60 (BP Location: Right Arm, Patient Position: Sitting, Cuff Size: Normal)   Pulse (!) 55   Temp 98.6 F (37 C) (Oral)   Ht '5\' 6"'$  (1.676 m)   Wt 139 lb (63 kg)   BMI 22.44 kg/m  Gen: NAD, alert, cooperative with exam HEENT: NCAT, oropharynx moist and clear CV: Slightly bradycardic, regular rhythm Resp: CTABL, no wheezes, non-labored Abd: SNTND, BS present, no guarding or organomegaly Ext: No edema, warm Neuro: Alert and oriented, No gross deficits  Assessment and plan:  #Hypertension Well controlled Continue ACE inhibitor plus beta-blocker, formally discontinue HCTZ as he is already done this HR slightly low - likely due to BB Labs today  #Hypothyroidism Labs today, a symptom medic Continue Synthroid at low dose  #Hyperlipidemia With history of CAD, LDL goal less than 70 Previous LDL was 56 Continue Zetia   Orders Placed This Encounter  Procedures  . CMP14+EGFR  . CBC with Differential/Platelet  . Lipid panel  . TSH     Laroy Apple, MD [facility] 12/13/2017, 11:08 AM

## 2017-12-14 ENCOUNTER — Other Ambulatory Visit: Payer: Self-pay | Admitting: Family Medicine

## 2017-12-14 ENCOUNTER — Other Ambulatory Visit: Payer: Self-pay | Admitting: *Deleted

## 2017-12-14 LAB — CMP14+EGFR
A/G RATIO: 2 (ref 1.2–2.2)
ALK PHOS: 43 IU/L (ref 39–117)
ALT: 11 IU/L (ref 0–44)
AST: 19 IU/L (ref 0–40)
Albumin: 4.1 g/dL (ref 3.5–4.7)
BILIRUBIN TOTAL: 0.7 mg/dL (ref 0.0–1.2)
BUN/Creatinine Ratio: 26 — ABNORMAL HIGH (ref 10–24)
BUN: 28 mg/dL — ABNORMAL HIGH (ref 8–27)
CHLORIDE: 104 mmol/L (ref 96–106)
CO2: 26 mmol/L (ref 20–29)
Calcium: 9.1 mg/dL (ref 8.6–10.2)
Creatinine, Ser: 1.08 mg/dL (ref 0.76–1.27)
GFR calc Af Amer: 71 mL/min/{1.73_m2} (ref >59)
GFR calc non Af Amer: 62 mL/min/{1.73_m2} (ref >59)
GLOBULIN, TOTAL: 2.1 g/dL (ref 1.5–4.5)
Glucose: 91 mg/dL (ref 65–99)
POTASSIUM: 4.6 mmol/L (ref 3.5–5.2)
SODIUM: 142 mmol/L (ref 134–144)
Total Protein: 6.2 g/dL (ref 6.0–8.5)

## 2017-12-14 LAB — LIPID PANEL
CHOLESTEROL TOTAL: 135 mg/dL (ref 100–199)
Chol/HDL Ratio: 2.2 ratio (ref 0.0–5.0)
HDL: 61 mg/dL (ref >39)
LDL Calculated: 62 mg/dL (ref 0–99)
Triglycerides: 61 mg/dL (ref 0–149)
VLDL CHOLESTEROL CAL: 12 mg/dL (ref 5–40)

## 2017-12-14 LAB — CBC WITH DIFFERENTIAL/PLATELET
BASOS ABS: 0 10*3/uL (ref 0.0–0.2)
BASOS: 0 %
EOS (ABSOLUTE): 0.2 10*3/uL (ref 0.0–0.4)
Eos: 3 %
Hematocrit: 41.5 % (ref 37.5–51.0)
Hemoglobin: 13.4 g/dL (ref 13.0–17.7)
IMMATURE GRANS (ABS): 0 10*3/uL (ref 0.0–0.1)
IMMATURE GRANULOCYTES: 0 %
Lymphocytes Absolute: 1.5 10*3/uL (ref 0.7–3.1)
Lymphs: 29 %
MCH: 31.5 pg (ref 26.6–33.0)
MCHC: 32.3 g/dL (ref 31.5–35.7)
MCV: 98 fL — AB (ref 79–97)
MONOS ABS: 0.4 10*3/uL (ref 0.1–0.9)
Monocytes: 8 %
Neutrophils Absolute: 3.1 10*3/uL (ref 1.4–7.0)
Neutrophils: 60 %
PLATELETS: 106 10*3/uL — AB (ref 150–450)
RBC: 4.25 x10E6/uL (ref 4.14–5.80)
RDW: 13.7 % (ref 12.3–15.4)
WBC: 5.1 10*3/uL (ref 3.4–10.8)

## 2017-12-14 LAB — TSH: TSH: 5.79 u[IU]/mL — ABNORMAL HIGH (ref 0.450–4.500)

## 2017-12-14 MED ORDER — LEVOTHYROXINE SODIUM 75 MCG PO TABS: 75.0000 ug | ORAL_TABLET | Freq: Every day | ORAL | 1 refills | Status: DC

## 2017-12-23 DIAGNOSIS — H401133 Primary open-angle glaucoma, bilateral, severe stage: Secondary | ICD-10-CM | POA: Diagnosis not present

## 2017-12-23 DIAGNOSIS — H524 Presbyopia: Secondary | ICD-10-CM | POA: Diagnosis not present

## 2017-12-23 DIAGNOSIS — H04123 Dry eye syndrome of bilateral lacrimal glands: Secondary | ICD-10-CM | POA: Diagnosis not present

## 2018-02-03 ENCOUNTER — Encounter: Payer: Self-pay | Admitting: *Deleted

## 2018-02-03 ENCOUNTER — Ambulatory Visit: Payer: Medicare Other | Admitting: *Deleted

## 2018-02-03 ENCOUNTER — Ambulatory Visit (INDEPENDENT_AMBULATORY_CARE_PROVIDER_SITE_OTHER): Payer: Medicare Other | Admitting: *Deleted

## 2018-02-03 VITALS — BP 99/59 | HR 60 | Ht 62.0 in | Wt 138.0 lb

## 2018-02-03 DIAGNOSIS — Z0000 Encounter for general adult medical examination without abnormal findings: Secondary | ICD-10-CM | POA: Diagnosis not present

## 2018-02-03 NOTE — Progress Notes (Addendum)
Subjective:   [name] is a 84 y.o. male who presents for a Medicare Annual Wellness Visit. [name] lives at home with his wife of 64 years. They do not have any children. He enjoys working puzzles, playing the piano, and gardening. He has very poor eyesight and vision. His wife is able to help with anything he can't do due to this.    Review of Systems    Patient reports that his overall health is unchanged compared to last year.  Cardiac Risk Factors include: advanced age (>8men, >47 women);dyslipidemia;male gender;sedentary lifestyle;hypertension   All other systems negative       Current Medications (verified) Outpatient Encounter Medications as of 02/03/2018  Medication Sig  . aspirin 81 MG tablet Take 81 mg by mouth daily.  . dorzolamide (TRUSOPT) 2 % ophthalmic solution Place 1 drop into the left eye 3 (three) times daily.   Marland Kitchen ezetimibe (ZETIA) 10 MG tablet Take 1 tablet (10 mg total) by mouth daily. TAKE 1 TABLET (10 MG TOTAL) BY MOUTH DAILY.  Marland Kitchen levothyroxine (SYNTHROID, LEVOTHROID) 75 MCG tablet Take 1 tablet (75 mcg total) by mouth daily.  . metoprolol tartrate (LOPRESSOR) 50 MG tablet Take 0.5 tablets (25 mg total) by mouth 2 (two) times daily.  . Multiple Vitamins-Minerals (CENTRUM SILVER PO) Take by mouth.  . Niacin CR 1000 MG TBCR Take 1 tablet (1,000 mg total) by mouth daily.  . ramipril (ALTACE) 5 MG capsule TAKE ONE CAPSULE BY MOUTH ONCE DAILY  . TIMOLOL MALEATE OP Place 1 drop into the left eye 2 (two) times daily. LEFT EYE   . Travoprost, BAK Free, (TRAVATAN) 0.004 % SOLN ophthalmic solution Place 1 drop into the left eye at bedtime.   Marland Kitchen liver oil-zinc oxide (BOUDREAUXS BUTT PASTE) 40 % ointment Apply 1 application topically as needed for irritation.   No facility-administered encounter medications on file as of 02/03/2018.     Allergies (verified) Levaquin [levofloxacin]   History: Past Medical History:  Diagnosis Date  . Aortic stenosis    a.  s/p tissue AVR 08/2013;  b. Echo (09/2013):  Mild LVH, EF 55-60%, no RWMA, Gr 1 DD, AVR ok (mean gradient 9 mmHg), mild MR, mild LAE, mildly reduced RVSF, mild TR, PASP 32 mmHg.  Marland Kitchen CAD (coronary artery disease)   . Cerebrovascular disease, unspecified   . Glaucoma    Bilateral eyes  . HLD (hyperlipidemia)   . HOH (hard of hearing)    wears bilateral hearing aids  . HTN (hypertension)   . Pneumonia July 23, 2013  . S/P aortic valve replacement with bioprosthetic valve 09/13/2013   25 mm [facility] Ease bovine bioprosthetic tissue valve  . Skin cancer of face    removed from nose   Past Surgical History:  Procedure Laterality Date  . AORTIC VALVE REPLACEMENT N/A 09/13/2013   Procedure: AORTIC VALVE REPLACEMENT (AVR);  Surgeon: Rexene Alberts, MD;  Location: [place];  Service: Open Heart Surgery;  Laterality: N/A;  . CARDIAC CATHETERIZATION     no PCI  . CAROTID ENDARTERECTOMY Right   . CATARACT EXTRACTION Bilateral   . COLONOSCOPY W/ POLYPECTOMY    . EYE SURGERY    . INTRAOPERATIVE TRANSESOPHAGEAL ECHOCARDIOGRAM N/A 09/13/2013   Procedure: INTRAOPERATIVE TRANSESOPHAGEAL ECHOCARDIOGRAM;  Surgeon: Rexene Alberts, MD;  Location: [place];  Service: Open Heart Surgery;  Laterality: N/A;  . LEFT AND RIGHT HEART CATHETERIZATION WITH CORONARY ANGIOGRAM N/A 07/26/2013   Procedure: LEFT AND  RIGHT HEART CATHETERIZATION WITH CORONARY ANGIOGRAM;  Surgeon: Sinclair Grooms, MD;  Location: [facility] CATH LAB;  Service: Cardiovascular;  Laterality: N/A;  . RETINAL DETACHMENT SURGERY     Family History  Problem Relation Age of Onset  . Colon cancer Father   . Heart failure Mother        CHF   . Birth defects Sister    Social History   Socioeconomic History  . Marital status: Married    Spouse name: Not on file  . Number of children: 0  . Years of education: 21  . Highest education level: 12th grade  Occupational History  . Occupation: Retired  Scientific laboratory technician  . Financial resource strain: Not hard  at all  . Food insecurity:    Worry: Never true    Inability: Never true  . Transportation needs:    Medical: No    Non-medical: No  Tobacco Use  . Smoking status: Never Smoker  . Smokeless tobacco: Never Used  . Tobacco comment: tobacco use - no  Substance and Sexual Activity  . Alcohol use: No    Alcohol/week: 0.0 standard drinks  . Drug use: No  . Sexual activity: Not Currently  Lifestyle  . Physical activity:    Days per week: 3 days    Minutes per session: 30 min  . Stress: Not at all  Relationships  . Social connections:    Talks on phone: More than three times a week    Gets together: Not on file    Attends religious service: Never    Active member of club or organization: No    Attends meetings of clubs or organizations: Never    Relationship status: Married  Other Topics Concern  . Not on file  Social History Narrative   Married.     Tobacco Use No.  Clinical Intake:  Pre-visit preparation completed: No  Pain : No/denies pain     Nutritional Status: BMI 25 -29 Overweight Diabetes: No  How often do you need to have someone help you when you read instructions, pamphlets, or other written materials from your doctor or pharmacy?: 1 - Never What is the last grade level you completed in school?: 12     Information entered by :: Chong Sicilian, RN  Activities of Daily Living In your present state of health, do you have any difficulty performing the following activities: 02/03/2018  Hearing? Y  Vision? Y  Difficulty concentrating or making decisions? Y  Comment some difficulty with memory. No worse than last year.   Walking or climbing stairs? N  Dressing or bathing? N  Doing errands, shopping? N  Preparing Food and eating ? N  Using the Toilet? N  In the past six months, have you accidently leaked urine? N  Do you have problems with loss of bowel control? N  Managing your Medications? N  Managing your Finances? N  Housekeeping or managing your  Housekeeping? N  Some recent data might be hidden     Diet 3 meals a day and some snacks. Mostly homecooked meals.  Drinks water. His wife makes sure he drinks water.   Exercise Current Exercise Habits: The patient does not participate in regular exercise at present, Exercise limited by: Other - see comments;orthopedic condition(s)(advanced age)    Depression Screen PHQ 2/9 Scores 02/03/2018 12/13/2017 09/10/2017 08/13/2017  PHQ - 2 Score 0 0 0 0     Fall Risk Fall Risk  02/03/2018 12/13/2017 09/10/2017 08/13/2017 03/22/2017  Falls in the past year? Yes No Yes Yes Yes  Comment slipped on step on in the rain - - - -  Number falls in past yr: 1 - 1 1 1   Injury with Fall? No - No No No  Risk for fall due to : History of fall(s) - - - -  Follow up Falls prevention discussed - - - -    Safety Is the patient's home free of loose throw rugs in walkways, pet beds, electrical cords, etc?   yes      Grab bars in the bathroom? no      Walkin shower? yes      Shower Seat? no      Handrails on the stairs?   yes      Adequate lighting?   yes  Patient Care Team: Timmothy Euler, MD as PCP - General (Family Medicine) Stanford Breed Denice Bors, MD as PCP - Cardiology (Cardiology) Dorann Ou, MD as Consulting Physician (Ophthalmology) Calvert Cantor, MD as Consulting Physician (Ophthalmology)  Hospitalizations, surgeries, and ER visits in previous 12 months No hospitalizations, ER visits, or surgeries this past year Objective:    Today's Vitals   02/03/18 1518  BP: (!) 99/59  Pulse: 60  Weight: 138 lb (62.6 kg)  Height: 5\' 2"  (1.575 m)   Body mass index is 25.24 kg/m.  Advanced Directives 01/25/2017 06/12/2016 09/13/2013 09/11/2013 07/25/2013  Does Patient Have a Medical Advance Directive? No No Patient does not have advance directive;Patient would not like information Patient does not have advance directive;Patient would not like information Patient does not have advance directive  Would patient  like information on creating a medical advance directive? Yes (MAU/Ambulatory/Procedural Areas - Information given) Yes (MAU/Ambulatory/Procedural Areas - Information given) - - -  Pre-existing out of facility DNR order (yellow form or pink MOST form) - - - - No    Hearing/Vision  Significant hearing deficit noted. Reports vision loss as well although this was not observed during the visit.    Cognitive Function: MMSE - Mini Mental State Exam 02/03/2018 01/25/2017  Orientation to time 2 3  Orientation to Place 5 5  Registration 3 3  Attention/ Calculation 3 5  Recall 1 1  Language- name 2 objects 2 2  Language- repeat 1 1  Language- follow 3 step command 3 3  Language- read & follow direction 1 1  Write a sentence 1 1  Copy design 1 1  Total score 23 26       Normal Cognitive Function Screening: No: Patient was not oriented to time but was to place and he had difficulty with recall. Has noticed some difficulty with recall in everyday life as well. His wife keeps up with appointments for him and therefore dates don't mean a whole lot to him.     Immunizations and Health Maintenance Immunization History  Administered Date(s) Administered  . Influenza, High Dose Seasonal PF 04/15/2017  . Influenza,inj,Quad PF,6+ Mos 05/03/2013, 04/16/2014, 04/10/2015, 04/14/2016  . Pneumococcal Conjugate-13 01/23/2015  . Pneumococcal Polysaccharide-23 07/27/2013   Health Maintenance Due  Topic Date Due  . INFLUENZA VACCINE  01/27/2018   Health Maintenance  Topic Date Due  . INFLUENZA VACCINE  01/27/2018  . TETANUS/TDAP  02/04/2019 (Originally 09/20/1948)  . PNA vac Low Risk Adult  Completed        Assessment:   This is a routine wellness examination for [name].      Plan:    Goals    . Exercise  3x per week (30 min per time)     Continue to stay active. Try to walk for several times per week as the weather permits.         Health Maintenance Recommendations: Td vaccine if  injured. Check cost of Tdap at next visit.   Additional Screening Recommendations: Lung: Low Dose CT Chest recommended if Age 19-80 years, 30 pack-year currently smoking OR have quit w/in 15years. Patient does not qualify. Hepatitis C Screening recommended: no  Today's Orders No orders of the defined types were placed in this encounter.   Keep appt with Dr Livia Snellen in October. F/u sooner if necessary.  Continue current medications Move carefully to avoid falls. Use assistive devices like a cane or walker if needed. Aim for at least 150 minutes of moderate activity a week. This can be chair exercises if necessary. Reading or puzzles are a good way to exercise your brain Stay connected with friends and family. Social connections are beneficial to your emotional and mental health.   I have personally reviewed and noted the following in the patient's chart:   . Medical and social history . Use of alcohol, tobacco or illicit drugs  . Current medications and supplements . Functional ability and status . Nutritional status . Physical activity . Advanced directives . List of other physicians . Hospitalizations, surgeries, and ER visits in previous 12 months . Vitals . Screenings to include cognitive, depression, and falls . Referrals and appointments  In addition, I have reviewed and discussed with patient certain preventive protocols, quality metrics, and best practice recommendations. A written personalized care plan for preventive services as well as general preventive health recommendations were provided to patient.     Chong Sicilian, RN   02/03/2018    I have reviewed and agree with the above AWV documentation.  [name], M.D.

## 2018-02-03 NOTE — Patient Instructions (Signed)
  [name] , Thank you for taking time to come for your Medicare Wellness Visit. I appreciate your ongoing commitment to your health goals. Please review the following plan we discussed and let me know if I can assist you in the future.   These are the goals we discussed: Goals    . Exercise 3x per week (30 min per time)     Continue to stay active. Try to walk for several times per week as the weather permits.        This is a list of the screening recommended for you and due dates:  Health Maintenance  Topic Date Due  . Tetanus Vaccine  09/20/1948  . Flu Shot  01/27/2018  . Pneumonia vaccines  Completed

## 2018-04-18 ENCOUNTER — Encounter: Payer: Self-pay | Admitting: Family Medicine

## 2018-04-18 ENCOUNTER — Ambulatory Visit: Payer: Medicare Other | Admitting: Family Medicine

## 2018-04-18 VITALS — BP 115/67 | HR 63 | Temp 97.4°F | Ht 62.0 in | Wt 137.4 lb

## 2018-04-18 DIAGNOSIS — H903 Sensorineural hearing loss, bilateral: Secondary | ICD-10-CM

## 2018-04-18 DIAGNOSIS — I10 Essential (primary) hypertension: Secondary | ICD-10-CM

## 2018-04-18 DIAGNOSIS — Z23 Encounter for immunization: Secondary | ICD-10-CM | POA: Diagnosis not present

## 2018-04-18 DIAGNOSIS — E782 Mixed hyperlipidemia: Secondary | ICD-10-CM

## 2018-04-18 DIAGNOSIS — E039 Hypothyroidism, unspecified: Secondary | ICD-10-CM | POA: Diagnosis not present

## 2018-04-18 DIAGNOSIS — I2510 Atherosclerotic heart disease of native coronary artery without angina pectoris: Secondary | ICD-10-CM

## 2018-04-18 MED ORDER — LEVOTHYROXINE SODIUM 75 MCG PO TABS: 75.0000 ug | ORAL_TABLET | Freq: Every day | ORAL | 1 refills | Status: DC

## 2018-04-18 NOTE — Progress Notes (Signed)
Subjective:  Patient ID: [name],  male    DOB: 1930/04/19  Age: 84 y.o.    CC: Medical Management of Chronic Issues   HPI [name] presents for  follow-up of hypertension. Patient has no history of headache chest pain or shortness of breath or recent cough. Patient also denies symptoms of TIA such as numbness weakness lateralizing. Patient denies side effects from medication. States taking it regularly.  Patient also  in for follow-up of elevated cholesterol. Doing well without complaints on current medication. Denies side effects  including myalgia and arthralgia and nausea. Also in today for liver function testing. Currently no chest pain, shortness of breath or other cardiovascular related symptoms noted.  Patient presents for follow-up on  thyroid. The patient has a history of hypothyroidism for many years. It has been stable recently. Pt. denies any change in  voice, loss of hair, heat or cold intolerance. Energy level has been adequate to good. Patient denies constipation and diarrhea. No myxedema. Medication is as noted below. Verified that pt is taking it daily on an empty stomach. Well tolerated.  History [name] has a past medical history of Aortic stenosis, CAD (coronary artery disease), Cerebrovascular disease, unspecified, Glaucoma, HLD (hyperlipidemia), HOH (hard of hearing), HTN (hypertension), Pneumonia (July 23, 2013), S/P aortic valve replacement with bioprosthetic valve (09/13/2013), and Skin cancer of face.   He has a past surgical history that includes Carotid endarterectomy (Right); Cardiac catheterization; Colonoscopy w/ polypectomy; Cataract extraction (Bilateral); Retinal detachment surgery; Eye surgery; Aortic valve replacement (N/A, 09/13/2013); Intraoprative transesophageal echocardiogram (N/A, 09/13/2013); and left and right heart catheterization with coronary angiogram (N/A, 07/26/2013).   His family history includes Birth defects in his sister; Colon cancer  in his father; Heart failure in his mother.He reports that he has never smoked. He has never used smokeless tobacco. He reports that he does not drink alcohol or use drugs.  Current Outpatient Medications on File Prior to Visit  Medication Sig Dispense Refill  . aspirin 81 MG tablet Take 81 mg by mouth daily.    . dorzolamide (TRUSOPT) 2 % ophthalmic solution Place 1 drop into the left eye 3 (three) times daily.   3  . ezetimibe (ZETIA) 10 MG tablet Take 1 tablet (10 mg total) by mouth daily. TAKE 1 TABLET (10 MG TOTAL) BY MOUTH DAILY. 90 tablet 3  . metoprolol tartrate (LOPRESSOR) 50 MG tablet Take 0.5 tablets (25 mg total) by mouth 2 (two) times daily. 90 tablet 2  . Multiple Vitamins-Minerals (CENTRUM SILVER PO) Take by mouth.    . Niacin CR 1000 MG TBCR Take 1 tablet (1,000 mg total) by mouth daily. 90 tablet 3  . ramipril (ALTACE) 5 MG capsule TAKE ONE CAPSULE BY MOUTH ONCE DAILY 90 capsule 3  . TIMOLOL MALEATE OP Place 1 drop into the left eye 2 (two) times daily. LEFT EYE     . Travoprost, BAK Free, (TRAVATAN) 0.004 % SOLN ophthalmic solution Place 1 drop into the left eye at bedtime.      No current facility-administered medications on file prior to visit.     ROS Review of Systems  Constitutional: Negative.   HENT: Positive for hearing loss (hearing aids help a little).   Eyes: Negative for visual disturbance.  Respiratory: Negative for cough and shortness of breath.   Cardiovascular: Negative for chest pain and leg swelling.  Gastrointestinal: Negative for abdominal pain, diarrhea, nausea and vomiting.  Genitourinary: Negative for difficulty urinating.  Musculoskeletal: Negative  for arthralgias and myalgias.  Skin: Negative for rash.  Neurological: Negative for headaches.  Psychiatric/Behavioral: Negative for sleep disturbance.    Objective:  BP 115/67   Pulse 63   Temp (!) 97.4 F (36.3 C) (Oral)   Ht 5' 2"  (1.575 m)   Wt 137 lb 6.4 oz (62.3 kg)   BMI 25.13 kg/m    BP Readings from Last 3 Encounters:  04/18/18 115/67  02/03/18 (!) 99/59  12/13/17 105/60    Wt Readings from Last 3 Encounters:  04/18/18 137 lb 6.4 oz (62.3 kg)  02/03/18 138 lb (62.6 kg)  12/13/17 139 lb (63 kg)     Physical Exam  Constitutional: He is oriented to person, place, and time. He appears well-developed and well-nourished. No distress.  HENT:  Head: Normocephalic and atraumatic.  Right Ear: External ear normal.  Left Ear: External ear normal.  Nose: Nose normal.  Mouth/Throat: Oropharynx is clear and moist.  Eyes: Pupils are equal, round, and reactive to light. Conjunctivae and EOM are normal.  Neck: Normal range of motion. Neck supple.  Cardiovascular: Normal rate, regular rhythm and normal heart sounds.  No murmur heard. Pulmonary/Chest: Effort normal and breath sounds normal. No respiratory distress. He has no wheezes. He has no rales.  Abdominal: Soft. There is no tenderness.  Musculoskeletal: Normal range of motion.  Neurological: He is alert and oriented to person, place, and time. He has normal reflexes.  Skin: Skin is warm and dry.  Psychiatric: He has a normal mood and affect. His behavior is normal. Judgment and thought content normal.        Assessment & Plan:   [name] was seen today for medical management of chronic issues.  Diagnoses and all orders for this visit:  Mixed hyperlipidemia -     CBC with Differential/Platelet -     CMP14+EGFR -     Lipid panel  Coronary artery disease involving native coronary artery of native heart without angina pectoris -     CBC with Differential/Platelet -     CMP14+EGFR -     Lipid panel  Essential hypertension -     CBC with Differential/Platelet -     CMP14+EGFR -     Lipid panel  Hypothyroidism, unspecified type -     CBC with Differential/Platelet -     CMP14+EGFR -     Lipid panel -     Thyroid Panel With TSH  Sensorineural hearing loss (SNHL) of both ears  Other orders -      levothyroxine (SYNTHROID, LEVOTHROID) 75 MCG tablet; Take 1 tablet (75 mcg total) by mouth daily.   I have discontinued [name]'s liver oil-zinc oxide. I am also having him maintain his Travoprost (BAK Free), dorzolamide, Multiple Vitamins-Minerals (CENTRUM SILVER PO), aspirin, TIMOLOL MALEATE OP, ezetimibe, Niacin CR, ramipril, metoprolol tartrate, and levothyroxine.  Meds ordered this encounter  Medications  . levothyroxine (SYNTHROID, LEVOTHROID) 75 MCG tablet    Sig: Take 1 tablet (75 mcg total) by mouth daily.    Dispense:  90 tablet    Refill:  1     Follow-up: Return in about 6 months (around 10/18/2018).  [name], M.D.

## 2018-04-19 LAB — CBC WITH DIFFERENTIAL/PLATELET
BASOS ABS: 0 10*3/uL (ref 0.0–0.2)
Basos: 0 %
EOS (ABSOLUTE): 0.1 10*3/uL (ref 0.0–0.4)
Eos: 2 %
HEMOGLOBIN: 14.2 g/dL (ref 13.0–17.7)
Hematocrit: 42 % (ref 37.5–51.0)
Immature Grans (Abs): 0 10*3/uL (ref 0.0–0.1)
Immature Granulocytes: 0 %
Lymphocytes Absolute: 1.8 10*3/uL (ref 0.7–3.1)
Lymphs: 34 %
MCH: 32.1 pg (ref 26.6–33.0)
MCHC: 33.8 g/dL (ref 31.5–35.7)
MCV: 95 fL (ref 79–97)
MONOCYTES: 9 %
Monocytes Absolute: 0.5 10*3/uL (ref 0.1–0.9)
NEUTROS PCT: 55 %
Neutrophils Absolute: 2.8 10*3/uL (ref 1.4–7.0)
Platelets: 107 10*3/uL — ABNORMAL LOW (ref 150–450)
RBC: 4.43 x10E6/uL (ref 4.14–5.80)
RDW: 11.5 % — AB (ref 12.3–15.4)
WBC: 5.2 10*3/uL (ref 3.4–10.8)

## 2018-04-19 LAB — CMP14+EGFR
ALBUMIN: 4.4 g/dL (ref 3.5–4.7)
ALK PHOS: 38 IU/L — AB (ref 39–117)
ALT: 15 IU/L (ref 0–44)
AST: 22 IU/L (ref 0–40)
Albumin/Globulin Ratio: 2.2 (ref 1.2–2.2)
BUN/Creatinine Ratio: 23 (ref 10–24)
BUN: 27 mg/dL (ref 8–27)
Bilirubin Total: 0.7 mg/dL (ref 0.0–1.2)
CO2: 25 mmol/L (ref 20–29)
CREATININE: 1.2 mg/dL (ref 0.76–1.27)
Calcium: 9.5 mg/dL (ref 8.6–10.2)
Chloride: 103 mmol/L (ref 96–106)
GFR calc Af Amer: 63 mL/min/{1.73_m2} (ref >59)
GFR calc non Af Amer: 54 mL/min/{1.73_m2} — ABNORMAL LOW (ref >59)
GLUCOSE: 104 mg/dL — AB (ref 65–99)
Globulin, Total: 2 g/dL (ref 1.5–4.5)
Potassium: 4.1 mmol/L (ref 3.5–5.2)
Sodium: 141 mmol/L (ref 134–144)
Total Protein: 6.4 g/dL (ref 6.0–8.5)

## 2018-04-19 LAB — LIPID PANEL
Chol/HDL Ratio: 2.2 ratio (ref 0.0–5.0)
Cholesterol, Total: 141 mg/dL (ref 100–199)
HDL: 65 mg/dL (ref >39)
LDL Calculated: 66 mg/dL (ref 0–99)
TRIGLYCERIDES: 49 mg/dL (ref 0–149)
VLDL CHOLESTEROL CAL: 10 mg/dL (ref 5–40)

## 2018-04-19 LAB — THYROID PANEL WITH TSH
Free Thyroxine Index: 2.2 (ref 1.2–4.9)
T3 Uptake Ratio: 31 % (ref 24–39)
T4, Total: 7 ug/dL (ref 4.5–12.0)
TSH: 1.5 u[IU]/mL (ref 0.450–4.500)

## 2018-04-19 NOTE — Progress Notes (Signed)
Hello [name],  Your lab result is normal.Some minor variations that are not significant are commonly marked abnormal, but do not represent any medical problem for you.  Best regards, [name], M.D.

## 2018-07-07 DIAGNOSIS — H0288B Meibomian gland dysfunction left eye, upper and lower eyelids: Secondary | ICD-10-CM | POA: Diagnosis not present

## 2018-07-07 DIAGNOSIS — H401133 Primary open-angle glaucoma, bilateral, severe stage: Secondary | ICD-10-CM | POA: Diagnosis not present

## 2018-07-07 DIAGNOSIS — H04123 Dry eye syndrome of bilateral lacrimal glands: Secondary | ICD-10-CM | POA: Diagnosis not present

## 2018-07-07 DIAGNOSIS — H0288A Meibomian gland dysfunction right eye, upper and lower eyelids: Secondary | ICD-10-CM | POA: Diagnosis not present

## 2018-07-12 NOTE — Progress Notes (Signed)
HPI: FU AVR (h/o AS); hx of carotid stenosis s/p CEA, HTN, HL. He is intol to statins. Echo 1/15 with normal LV function and severe AS (mean 52). LHC demonstrated non-obstructive CAD. Had AVR 3/15. Postoperative course was complicated by atrial fibrillation. He converted to NSR on amiodarone.  Studies: - LHC (07/26/13): Left Main calcified, proximal mid and distal LAD 50, circumflex and RCA patent, EF 60%.   - Carotid US (4/19):1-39 bilateralICA stenosis.   - Echocardiogram postoperatively in April of 2015 showed normal LV function, grade 1 diastolic dysfunction, bioprosthetic aortic valve with a mean gradient of 9 mm of mercury. There was mild mitral regurgitation and mild left atrial enlargement.   Since last seen,he denies dyspnea, chest pain, palpitations or syncope.  Current Outpatient Medications  Medication Sig Dispense Refill  . aspirin 81 MG tablet Take 81 mg by mouth daily.    . dorzolamide (TRUSOPT) 2 % ophthalmic solution Place 1 drop into the left eye 3 (three) times daily.   3  . ezetimibe (ZETIA) 10 MG tablet Take 1 tablet (10 mg total) by mouth daily. TAKE 1 TABLET (10 MG TOTAL) BY MOUTH DAILY. 90 tablet 3  . levothyroxine (SYNTHROID, LEVOTHROID) 75 MCG tablet Take 1 tablet (75 mcg total) by mouth daily. 90 tablet 1  . metoprolol tartrate (LOPRESSOR) 50 MG tablet Take 0.5 tablets (25 mg total) by mouth 2 (two) times daily. 90 tablet 2  . Multiple Vitamins-Minerals (CENTRUM SILVER PO) Take by mouth.    . Niacin CR 1000 MG TBCR Take 1 tablet (1,000 mg total) by mouth daily. 90 tablet 3  . ramipril (ALTACE) 5 MG capsule TAKE ONE CAPSULE BY MOUTH ONCE DAILY 90 capsule 3  . TIMOLOL MALEATE OP Place 1 drop into the left eye 2 (two) times daily. LEFT EYE     . Travoprost, BAK Free, (TRAVATAN) 0.004 % SOLN ophthalmic solution Place 1 drop into the left eye at bedtime.      No current facility-administered medications for this visit.      Past Medical History:    Diagnosis Date  . Aortic stenosis    a. s/p tissue AVR 08/2013;  b. Echo (09/2013):  Mild LVH, EF 55-60%, no RWMA, Gr 1 DD, AVR ok (mean gradient 9 mmHg), mild MR, mild LAE, mildly reduced RVSF, mild TR, PASP 32 mmHg.  Marland Kitchen CAD (coronary artery disease)   . Cerebrovascular disease, unspecified   . Glaucoma    Bilateral eyes  . HLD (hyperlipidemia)   . HOH (hard of hearing)    wears bilateral hearing aids  . HTN (hypertension)   . Pneumonia July 23, 2013  . S/P aortic valve replacement with bioprosthetic valve 09/13/2013   25 mm [facility] Ease bovine bioprosthetic tissue valve  . Skin cancer of face    removed from nose    Past Surgical History:  Procedure Laterality Date  . AORTIC VALVE REPLACEMENT N/A 09/13/2013   Procedure: AORTIC VALVE REPLACEMENT (AVR);  Surgeon: Rexene Alberts, MD;  Location: [facility];  Service: Open Heart Surgery;  Laterality: N/A;  . CARDIAC CATHETERIZATION     no PCI  . CAROTID ENDARTERECTOMY Right   . CATARACT EXTRACTION Bilateral   . COLONOSCOPY W/ POLYPECTOMY    . EYE SURGERY    . INTRAOPERATIVE TRANSESOPHAGEAL ECHOCARDIOGRAM N/A 09/13/2013   Procedure: INTRAOPERATIVE TRANSESOPHAGEAL ECHOCARDIOGRAM;  Surgeon: Rexene Alberts, MD;  Location: [facility];  Service: Open Heart Surgery;  Laterality: N/A;  . LEFT  AND RIGHT HEART CATHETERIZATION WITH CORONARY ANGIOGRAM N/A 07/26/2013   Procedure: LEFT AND RIGHT HEART CATHETERIZATION WITH CORONARY ANGIOGRAM;  Surgeon: Sinclair Grooms, MD;  Location: [facility] CATH LAB;  Service: Cardiovascular;  Laterality: N/A;  . RETINAL DETACHMENT SURGERY      Social History   Socioeconomic History  . Marital status: Married    Spouse name: Not on file  . Number of children: 0  . Years of education: 7  . Highest education level: 12th grade  Occupational History  . Occupation: Retired  Scientific laboratory technician  . Financial resource strain: Not hard at all  . Food insecurity:    Worry: Never true    Inability: Never true  .  Transportation needs:    Medical: No    Non-medical: No  Tobacco Use  . Smoking status: Never Smoker  . Smokeless tobacco: Never Used  . Tobacco comment: tobacco use - no  Substance and Sexual Activity  . Alcohol use: No    Alcohol/week: 0.0 standard drinks  . Drug use: No  . Sexual activity: Not Currently  Lifestyle  . Physical activity:    Days per week: 3 days    Minutes per session: 30 min  . Stress: Not at all  Relationships  . Social connections:    Talks on phone: More than three times a week    Gets together: Not on file    Attends religious service: Never    Active member of club or organization: No    Attends meetings of clubs or organizations: Never    Relationship status: Married  . Intimate partner violence:    Fear of current or ex partner: No    Emotionally abused: No    Physically abused: No    Forced sexual activity: No  Other Topics Concern  . Not on file  Social History Narrative   Married.     Family History  Problem Relation Age of Onset  . Colon cancer Father   . Heart failure Mother        CHF   . Birth defects Sister     ROS: no fevers or chills, productive cough, hemoptysis, dysphasia, odynophagia, melena, hematochezia, dysuria, hematuria, rash, seizure activity, orthopnea, PND, pedal edema, claudication. Remaining systems are negative.  Physical Exam: Well-developed frail in no acute distress.  Skin is warm and dry.  HEENT is normal.  Neck is supple.  Chest is clear to auscultation with normal expansion.  Cardiovascular exam is regular rate and rhythm.  Abdominal exam nontender or distended. No masses palpated. Extremities show no edema. neuro grossly intact  ECG-sinus rhythm at a rate of 55, left bundle branch block.  Personally reviewed  A/P  1 prior aortic valve replacement-continue SBE prophylaxis.  2 coronary artery disease-patient denies chest pain.  Continue aspirin.  He is intolerant to statins.  3  hypertension-patient's blood pressure is controlled.  Continue present medications.  4 hyperlipidemia-he is intolerant to statins.  Will continue present dose of Zetia.  Lipids and liver are monitored by primary care.  5 carotid artery disease-mild on most recent Dopplers.  Repeat carotid Dopplers April 2020.  Continue aspirin.  Kirk Ruths, MD

## 2018-07-13 ENCOUNTER — Other Ambulatory Visit: Payer: Self-pay

## 2018-07-13 MED ORDER — LEVOTHYROXINE SODIUM 75 MCG PO TABS: 75.0000 ug | ORAL_TABLET | Freq: Every day | ORAL | 1 refills | Status: DC

## 2018-07-18 ENCOUNTER — Ambulatory Visit (INDEPENDENT_AMBULATORY_CARE_PROVIDER_SITE_OTHER): Payer: Medicare Other | Admitting: Cardiology

## 2018-07-18 ENCOUNTER — Encounter: Payer: Self-pay | Admitting: Cardiology

## 2018-07-18 VITALS — BP 142/60 | HR 55 | Ht 62.0 in | Wt 139.8 lb

## 2018-07-18 DIAGNOSIS — E7800 Pure hypercholesterolemia, unspecified: Secondary | ICD-10-CM | POA: Diagnosis not present

## 2018-07-18 DIAGNOSIS — I359 Nonrheumatic aortic valve disorder, unspecified: Secondary | ICD-10-CM | POA: Diagnosis not present

## 2018-07-18 DIAGNOSIS — I10 Essential (primary) hypertension: Secondary | ICD-10-CM | POA: Diagnosis not present

## 2018-07-18 DIAGNOSIS — I2510 Atherosclerotic heart disease of native coronary artery without angina pectoris: Secondary | ICD-10-CM | POA: Diagnosis not present

## 2018-07-18 MED ORDER — EZETIMIBE 10 MG PO TABS: 10.0000 mg | ORAL_TABLET | Freq: Every day | ORAL | 3 refills | Status: DC

## 2018-07-18 MED ORDER — NIACIN ER 1000 MG PO TBCR: 1.0000 | EXTENDED_RELEASE_TABLET | Freq: Every day | ORAL | 3 refills | Status: DC

## 2018-07-18 NOTE — Patient Instructions (Signed)
Medication Instructions:  NO CHANGE If you need a refill on your cardiac medications before your next appointment, please call your pharmacy.   Lab work: If you have labs (blood work) drawn today and your tests are completely normal, you will receive your results only by: Marland Kitchen MyChart Message (if you have MyChart) OR . A paper copy in the mail If you have any lab test that is abnormal or we need to change your treatment, we will call you to review the results.  Follow-Up: At [facility], you and your health needs are our priority.  As part of our continuing mission to provide you with exceptional heart care, we have created designated Provider Care Teams.  These Care Teams include your primary Cardiologist (physician) and Advanced Practice Providers (APPs -  Physician Assistants and Nurse Practitioners) who all work together to provide you with the care you need, when you need it. You will need a follow up appointment in 12 months.  Please call our office 2 months in advance to schedule this appointment.  You may see Kirk Ruths, MD or one of the following Advanced Practice Providers on your designated Care Team:   Kerin Ransom, PA-C Roby Lofts, [place] . Sande Rives, [place]  CALL IN November TO SCHEDULE APPOINTMENT IN January 2021

## 2018-09-29 ENCOUNTER — Other Ambulatory Visit: Payer: Self-pay | Admitting: Cardiology

## 2018-09-29 DIAGNOSIS — Z953 Presence of xenogenic heart valve: Secondary | ICD-10-CM

## 2018-09-29 DIAGNOSIS — I2583 Coronary atherosclerosis due to lipid rich plaque: Secondary | ICD-10-CM

## 2018-09-29 DIAGNOSIS — I2510 Atherosclerotic heart disease of native coronary artery without angina pectoris: Secondary | ICD-10-CM

## 2018-09-29 DIAGNOSIS — I679 Cerebrovascular disease, unspecified: Secondary | ICD-10-CM

## 2018-09-29 DIAGNOSIS — I10 Essential (primary) hypertension: Secondary | ICD-10-CM

## 2018-10-18 ENCOUNTER — Other Ambulatory Visit: Payer: Self-pay

## 2018-10-18 ENCOUNTER — Encounter: Payer: Self-pay | Admitting: Family Medicine

## 2018-10-18 ENCOUNTER — Ambulatory Visit (INDEPENDENT_AMBULATORY_CARE_PROVIDER_SITE_OTHER): Payer: Medicare Other | Admitting: Family Medicine

## 2018-10-18 DIAGNOSIS — E039 Hypothyroidism, unspecified: Secondary | ICD-10-CM

## 2018-10-18 DIAGNOSIS — I10 Essential (primary) hypertension: Secondary | ICD-10-CM | POA: Diagnosis not present

## 2018-10-18 DIAGNOSIS — I679 Cerebrovascular disease, unspecified: Secondary | ICD-10-CM | POA: Diagnosis not present

## 2018-10-18 DIAGNOSIS — Z953 Presence of xenogenic heart valve: Secondary | ICD-10-CM

## 2018-10-18 DIAGNOSIS — I2583 Coronary atherosclerosis due to lipid rich plaque: Secondary | ICD-10-CM

## 2018-10-18 DIAGNOSIS — I2510 Atherosclerotic heart disease of native coronary artery without angina pectoris: Secondary | ICD-10-CM

## 2018-10-18 MED ORDER — LEVOTHYROXINE SODIUM 75 MCG PO TABS: 75.0000 ug | ORAL_TABLET | Freq: Every day | ORAL | 1 refills | Status: DC

## 2018-10-18 MED ORDER — METOPROLOL TARTRATE 50 MG PO TABS: ORAL_TABLET | ORAL | 1 refills | Status: DC

## 2018-10-18 MED ORDER — RAMIPRIL 5 MG PO CAPS: 5.0000 mg | ORAL_CAPSULE | Freq: Every day | ORAL | 3 refills | Status: DC

## 2018-10-18 NOTE — Progress Notes (Signed)
Subjective:    Patient ID: [name], male    DOB: [birth year], 85 y.o.   MRN: [id number]  CC: Hypertension; Hypothyroidism; and Hyperlipidemia   HPI: [name] is a 85 y.o. male presenting for Hypertension; Hypothyroidism; and Hyperlipidemia   Follow-up of hypertension. Patient has no history of headache chest pain or shortness of breath or recent cough. Patient also denies symptoms of TIA such as numbness weakness lateralizing. Patient checks  blood pressure at home and has not had any elevated readings recently. Patient denies side effects from his medication. States taking it regularly. Blood pressure reading 119/68, P 57 this AM at home.  This is about the usual for him.  Patient presents for follow-up on  thyroid. The patient has a history of hypothyroidism for many years. It has been stable recently. Pt. denies any change in  voice, loss of hair, heat or cold intolerance. Energy level has been adequate to good. Patient denies constipation and diarrhea. No myxedema. Medication is as noted below. Verified that pt is taking it daily on an empty stomach. Well tolerated.  Depression screen [facility] 2/9 04/18/2018 02/03/2018 12/13/2017 09/10/2017 08/13/2017  Decreased Interest 0 0 0 0 0  Down, Depressed, Hopeless 0 0 0 0 0  PHQ - 2 Score 0 0 0 0 0     Relevant past medical, surgical, family and social history reviewed and updated as indicated.  Interim medical history since our last visit reviewed. Allergies and medications reviewed and updated.  ROS:  Review of Systems  Constitutional: Negative.   HENT: Positive for hearing loss.   Eyes: Negative for visual disturbance.  Respiratory: Negative for cough and shortness of breath.   Cardiovascular: Negative for chest pain and leg swelling.  Gastrointestinal: Negative for abdominal pain, diarrhea, nausea and vomiting.  Genitourinary: Negative for difficulty urinating.  Musculoskeletal: Positive for back pain ("normal for an old person").  Negative for arthralgias and myalgias.  Skin: Negative for rash.  Neurological: Negative for headaches.  Psychiatric/Behavioral: Negative for sleep disturbance.     Social History   Tobacco Use  Smoking Status Never Smoker  Smokeless Tobacco Never Used  Tobacco Comment   tobacco use - no       Objective:     Wt Readings from Last 3 Encounters:  07/18/18 139 lb 12.8 oz (63.4 kg)  04/18/18 137 lb 6.4 oz (62.3 kg)  02/03/18 138 lb (62.6 kg)     Exam deferred. Pt. Harboring due to COVID 19. Phone visit performed.   Assessment & Plan:   1. Hypothyroidism, unspecified type   2. Essential hypertension   3. S/P aortic valve replacement with bioprosthetic valve   4. Cerebrovascular disease   5. Coronary artery disease due to lipid rich plaque     Meds ordered this encounter  Medications  . ramipril (ALTACE) 5 MG capsule    Sig: Take 1 capsule (5 mg total) by mouth daily.    Dispense:  90 capsule    Refill:  3  . levothyroxine (SYNTHROID) 75 MCG tablet    Sig: Take 1 tablet (75 mcg total) by mouth daily.    Dispense:  90 tablet    Refill:  1  . metoprolol tartrate (LOPRESSOR) 50 MG tablet    Sig: Take 1/2 (one-half) tablet by mouth twice daily    Dispense:  90 tablet    Refill:  1    No orders of the defined types were placed in this encounter.  Issam was seen today for hypertension, hypothyroidism and hyperlipidemia.  Diagnoses and all orders for this visit:  Hypothyroidism, unspecified type  Essential hypertension -     ramipril (ALTACE) 5 MG capsule; Take 1 capsule (5 mg total) by mouth daily. -     metoprolol tartrate (LOPRESSOR) 50 MG tablet; Take 1/2 (one-half) tablet by mouth twice daily  S/P aortic valve replacement with bioprosthetic valve -     metoprolol tartrate (LOPRESSOR) 50 MG tablet; Take 1/2 (one-half) tablet by mouth twice daily  Cerebrovascular disease -     metoprolol tartrate (LOPRESSOR) 50 MG tablet; Take 1/2 (one-half) tablet by  mouth twice daily  Coronary artery disease due to lipid rich plaque -     metoprolol tartrate (LOPRESSOR) 50 MG tablet; Take 1/2 (one-half) tablet by mouth twice daily  Other orders -     levothyroxine (SYNTHROID) 75 MCG tablet; Take 1 tablet (75 mcg total) by mouth daily.    Virtual Visit via telephone Note  I discussed the limitations, risks, security and privacy concerns of performing an evaluation and management service by telephone and the availability of in person appointments. The patient was identified with two identifiers. Pt.expressed understanding and agreed to proceed. Pt. Is at home. Dr. Livia Snellen is in his home office.  Follow Up Instructions:   I discussed the assessment and treatment plan with the patient. The patient was provided an opportunity to ask questions and all were answered. The patient agreed with the plan and demonstrated an understanding of the instructions.   The patient was advised to call back or seek an in-person evaluation if the symptoms worsen or if the condition fails to improve as anticipated.  Visit started: 9:40 Call ended:  9:51 Total minutes including chart review and phone contact time: 17   Follow up plan: Return in about 6 months (around 04/19/2019).  [name], MD [facility] 10/18/2018, 2:34 PM

## 2018-10-19 ENCOUNTER — Ambulatory Visit: Payer: Medicare Other | Admitting: Family Medicine

## 2018-12-19 ENCOUNTER — Other Ambulatory Visit: Payer: Self-pay | Admitting: Cardiology

## 2018-12-19 DIAGNOSIS — I10 Essential (primary) hypertension: Secondary | ICD-10-CM

## 2018-12-19 DIAGNOSIS — I679 Cerebrovascular disease, unspecified: Secondary | ICD-10-CM

## 2018-12-19 DIAGNOSIS — Z953 Presence of xenogenic heart valve: Secondary | ICD-10-CM

## 2018-12-19 DIAGNOSIS — I2510 Atherosclerotic heart disease of native coronary artery without angina pectoris: Secondary | ICD-10-CM

## 2018-12-29 DIAGNOSIS — H04123 Dry eye syndrome of bilateral lacrimal glands: Secondary | ICD-10-CM | POA: Diagnosis not present

## 2018-12-29 DIAGNOSIS — H401133 Primary open-angle glaucoma, bilateral, severe stage: Secondary | ICD-10-CM | POA: Diagnosis not present

## 2019-02-13 ENCOUNTER — Other Ambulatory Visit: Payer: Self-pay | Admitting: Family Medicine

## 2019-04-06 ENCOUNTER — Ambulatory Visit (INDEPENDENT_AMBULATORY_CARE_PROVIDER_SITE_OTHER): Payer: Medicare Other | Admitting: *Deleted

## 2019-04-06 DIAGNOSIS — Z0000 Encounter for general adult medical examination without abnormal findings: Secondary | ICD-10-CM

## 2019-04-06 NOTE — Progress Notes (Addendum)
MEDICARE ANNUAL WELLNESS VISIT  04/06/2019  Telephone Visit Disclaimer This Medicare AWV was conducted by telephone due to national recommendations for restrictions regarding the COVID-19 Pandemic (e.g. social distancing).  I verified, using two identifiers, that I am speaking with [name] or their authorized healthcare agent. I discussed the limitations, risks, security, and privacy concerns of performing an evaluation and management service by telephone and the potential availability of an in-person appointment in the future. The patient expressed understanding and agreed to proceed.   Subjective:  [name] is a 85 y.o. male patient of Stacks, Cletus Gash, MD who had a Medicare Annual Wellness Visit today via telephone. His wife [name] was on the phone with him today to help if he had trouble hearing. [name] is Retired and lives with their spouse. he has 0 children. he reports that he is socially active and does interact with friends/family regularly. he is moderately physically active and enjoys doing jigsaw puzzles and walking around his garden.  Patient Care Team: [name], MD as PCP - General (Family Medicine) Stanford Breed Denice Bors, MD as PCP - Cardiology (Cardiology) Dorann Ou, MD as Consulting Physician (Ophthalmology) Calvert Cantor, MD as Consulting Physician (Ophthalmology)  Advanced Directives 04/06/2019 01/25/2017 06/12/2016 09/13/2013 09/11/2013 07/25/2013  Does Patient Have a Medical Advance Directive? No No No Patient does not have advance directive;Patient would not like information Patient does not have advance directive;Patient would not like information Patient does not have advance directive  Would patient like information on creating a medical advance directive? No - Patient declined Yes (MAU/Ambulatory/Procedural Areas - Information given) Yes (MAU/Ambulatory/Procedural Areas - Information given) - - -  Pre-existing out of facility DNR order (yellow form or pink MOST form)  - - - - - No    Hospital Utilization Over the Past 12 Months: # of hospitalizations or ER visits: 0 # of surgeries: 0  Review of Systems    Patient reports that his overall health is unchanged compared to last year.  History obtained from chart review  Patient Reported Readings (BP, Pulse, CBG, Weight, etc) Wt-140lbs  BP-141/70  P-59   Temp-97.2   Pain Assessment Pain : No/denies pain     Current Medications & Allergies (verified) Allergies as of 04/06/2019      Reactions   Levaquin [levofloxacin]    RASH      Medication List       Accurate as of April 06, 2019 10:56 AM. If you have any questions, ask your nurse or doctor.        aspirin 81 MG tablet Take 81 mg by mouth daily.   CENTRUM SILVER PO Take by mouth.   dorzolamide 2 % ophthalmic solution Commonly known as: TRUSOPT Place 1 drop into the left eye 3 (three) times daily.   ezetimibe 10 MG tablet Commonly known as: Zetia Take 1 tablet (10 mg total) by mouth daily. TAKE 1 TABLET (10 MG TOTAL) BY MOUTH DAILY.   levothyroxine 75 MCG tablet Commonly known as: Euthyrox Take 1 tablet (75 mcg total) by mouth daily. (Please make your OCt appt)   metoprolol tartrate 50 MG tablet Commonly known as: LOPRESSOR Take 1/2 (one-half) tablet by mouth twice daily   Niacin CR 1000 MG Tbcr Take 1 tablet (1,000 mg total) by mouth daily.   ramipril 5 MG capsule Commonly known as: ALTACE Take 1 capsule by mouth once daily   TIMOLOL MALEATE OP Place 1 drop into the left eye 2 (two) times  daily. LEFT EYE   Travoprost (BAK Free) 0.004 % Soln ophthalmic solution Commonly known as: TRAVATAN Place 1 drop into the left eye at bedtime.       History (reviewed): Past Medical History:  Diagnosis Date  . Aortic stenosis    a. s/p tissue AVR 08/2013;  b. Echo (09/2013):  Mild LVH, EF 55-60%, no RWMA, Gr 1 DD, AVR ok (mean gradient 9 mmHg), mild MR, mild LAE, mildly reduced RVSF, mild TR, PASP 32 mmHg.  Marland Kitchen CAD (coronary  artery disease)   . Cerebrovascular disease, unspecified   . Glaucoma    Bilateral eyes  . HLD (hyperlipidemia)   . HOH (hard of hearing)    wears bilateral hearing aids  . HTN (hypertension)   . Pneumonia July 23, 2013  . S/P aortic valve replacement with bioprosthetic valve 09/13/2013   25 mm [facility] Ease bovine bioprosthetic tissue valve  . Skin cancer of face    removed from nose   Past Surgical History:  Procedure Laterality Date  . AORTIC VALVE REPLACEMENT N/A 09/13/2013   Procedure: AORTIC VALVE REPLACEMENT (AVR);  Surgeon: Rexene Alberts, MD;  Location: [place];  Service: Open Heart Surgery;  Laterality: N/A;  . CARDIAC CATHETERIZATION     no PCI  . CAROTID ENDARTERECTOMY Right   . CATARACT EXTRACTION Bilateral   . COLONOSCOPY W/ POLYPECTOMY    . EYE SURGERY    . INTRAOPERATIVE TRANSESOPHAGEAL ECHOCARDIOGRAM N/A 09/13/2013   Procedure: INTRAOPERATIVE TRANSESOPHAGEAL ECHOCARDIOGRAM;  Surgeon: Rexene Alberts, MD;  Location: [place];  Service: Open Heart Surgery;  Laterality: N/A;  . LEFT AND RIGHT HEART CATHETERIZATION WITH CORONARY ANGIOGRAM N/A 07/26/2013   Procedure: LEFT AND RIGHT HEART CATHETERIZATION WITH CORONARY ANGIOGRAM;  Surgeon: Sinclair Grooms, MD;  Location[facility] C[facility] LAB;  Service: Cardiovascular;  Laterality: N/A;  . RETINAL DETACHMENT SURGERY     Family History  Problem Relation Age of Onset  . Colon cancer Father   . Heart failure Mother        CHF   . Birth defects Sister    Social History   Socioeconomic History  . Marital status: Married    Spouse name: [name]  . Number of children: 0  . Years of education: 13  . Highest education level: 12th grade  Occupational History  . Occupation: Retired  Scientific laboratory technician  . Financial resource strain: Not hard at all  . Food insecurity    Worry: Never true    Inability: Never true  . Transportation needs    Medical: No    Non-medical: No  Tobacco Use  . Smoking status: Never Smoker  . Smokeless  tobacco: Never Used  . Tobacco comment: tobacco use - no  Substance and Sexual Activity  . Alcohol use: No    Alcohol/week: 0.0 standard drinks  . Drug use: No  . Sexual activity: Not Currently  Lifestyle  . Physical activity    Days per week: 5 days    Minutes per session: 30 min  . Stress: Not at all  Relationships  . Social connections    Talks on phone: More than three times a week    Gets together: More than three times a week    Attends religious service: Never    Active member of club or organization: No    Attends meetings of clubs or organizations: Never    Relationship status: Married  Other Topics Concern  . Not on file  Social History  Narrative   Married.     Activities of Daily Living In your present state of health, do you have any difficulty performing the following activities: 04/06/2019  Hearing? Y  Comment is supposed to wear hearing aids but he doesn't wear them like he is supposed to  Vision? Y  Comment glaucoma -follows up with Dr Bing Plume in [place]  Difficulty concentrating or making decisions? Y  Comment wife states he "can't remember anything"  Walking or climbing stairs? Y  Comment they don't have any stairs and pt uses a cane when he is walking on uneven ground outside  Dressing or bathing? N  Doing errands, shopping? Y  Comment wife has to drive him to all appointments and to run all errands  Preparing Food and eating ? Y  Comment wife prepares all of his meals but he can feed himself  Using the Toilet? N  In the past six months, have you accidently leaked urine? Y  Comment dribbles at times but not enough where he needs to wear a depend  Do you have problems with loss of bowel control? N  Managing your Medications? Y  Comment wife gives him all of his medications  Managing your Finances? Y  Comment wife takes care of all the finances  Housekeeping or managing your Housekeeping? Y  Comment wife does most of the housekeeping but pt does do  dishes and uses the swiffer to clean the floors  Some recent data might be hidden    Patient Education/ Literacy How often do you need to have someone help you when you read instructions, pamphlets, or other written materials from your doctor or pharmacy?: 1 - Never What is the last grade level you completed in school?: 12th grade  Exercise Current Exercise Habits: Home exercise routine, Type of exercise: walking, Time (Minutes): 30, Frequency (Times/Week): 5, Weekly Exercise (Minutes/Week): 150, Intensity: Moderate, Exercise limited by: None identified  Diet Patient reports consuming 3 meals a day and 0 snack(s) a day Patient reports that his primary diet is: Regular Patient reports that she does have regular access to food.   Depression Screen PHQ 2/9 Scores 04/06/2019 04/18/2018 02/03/2018 12/13/2017 09/10/2017 08/13/2017 03/22/2017  PHQ - 2 Score 0 0 0 0 0 0 0     Fall Risk Fall Risk  04/06/2019 04/18/2018 02/03/2018 12/13/2017 09/10/2017  Falls in the past year? 0 No Yes No Yes  Comment - - slipped on step on in the rain - -  Number falls in past yr: 0 - 1 - 1  Injury with Fall? 0 - No - No  Risk for fall due to : History of fall(s);Impaired vision - History of fall(s) - -  Follow up Falls prevention discussed - Falls prevention discussed - -  Comment Get rid of all throw rugs in the house, adequate lighting in the walkways and grab bars in the bathroom - - - -     Objective:  [name] seemed alert and oriented and he participated appropriately during our telephone visit.  Blood Pressure Weight BMI  BP Readings from Last 3 Encounters:  07/18/18 (!) 142/60  04/18/18 115/67  02/03/18 (!) 99/59   Wt Readings from Last 3 Encounters:  07/18/18 139 lb 12.8 oz (63.4 kg)  04/18/18 137 lb 6.4 oz (62.3 kg)  02/03/18 138 lb (62.6 kg)   BMI Readings from Last 1 Encounters:  07/18/18 25.57 kg/m    *Unable to obtain current vital signs, weight, and BMI due to  telephone visit type   Hearing/Vision  . [name] did not seem to have difficulty with hearing/understanding during the telephone conversation . Reports that he has had a formal eye exam by an eye care professional within the past year . Reports that he has not had a formal hearing evaluation within the past year *Unable to fully assess hearing and vision during telephone visit type  Cognitive Function: 6CIT Screen 04/06/2019  What Year? 0 points  What month? 3 points  What time? 0 points  Count back from 20 0 points  Months in reverse 2 points  Repeat phrase 4 points  Total Score 9   (Normal:0-7, Significant for Dysfunction: >8)  Normal Cognitive Function Screening: No: this was expected as it was abnormal last year and pt's wife states his memory has gotten worse. She takes care of everything that he is unable to do and helps him.   Immunization & Health Maintenance Record Immunization History  Administered Date(s) Administered  . Influenza, High Dose Seasonal PF 04/15/2017, 04/18/2018  . Influenza,inj,Quad PF,6+ Mos 05/03/2013, 04/16/2014, 04/10/2015, 04/14/2016  . Pneumococcal Conjugate-13 01/23/2015  . Pneumococcal Polysaccharide-23 07/27/2013    Health Maintenance  Topic Date Due  . TETANUS/TDAP  09/20/1948  . INFLUENZA VACCINE  01/28/2019  . PNA vac Low Risk Adult  Completed       Assessment  This is a routine wellness examination for [name].  Health Maintenance: Due or Overdue Health Maintenance Due  Topic Date Due  . TETANUS/TDAP  09/20/1948  . INFLUENZA VACCINE  01/28/2019    [name] does not need a referral for Community Assistance: Care Management:   no Social Work:    no Prescription Assistance:  no Nutrition/Diabetes Education:  no   Plan:  Personalized Goals Goals Addressed            This Visit's Progress   . DIET - INCREASE WATER INTAKE       Try to drink 6-8 glasses of water daily.      Personalized Health Maintenance & Screening Recommendations   Influenza vaccine Td vaccine Shingles vaccine  Lung Cancer Screening Recommended: no (Low Dose CT Chest recommended if Age 33-80 years, 30 pack-year currently smoking OR have quit w/in past 15 years) Hepatitis C Screening recommended: no HIV Screening recommended: no  Advanced Directives: Written information was not prepared per patient's request.  Referrals & Orders No orders of the defined types were placed in this encounter.   Follow-up Plan . Follow-up with [name], MD as planned . Consider Flu, Shingles and TDAP vaccines at your next visit with your PCP   I have personally reviewed and noted the following in the patient's chart:   . Medical and social history . Use of alcohol, tobacco or illicit drugs  . Current medications and supplements . Functional ability and status . Nutritional status . Physical activity . Advanced directives . List of other physicians . Hospitalizations, surgeries, and ER visits in previous 12 months . Vitals . Screenings to include cognitive, depression, and falls . Referrals and appointments  In addition, I have reviewed and discussed with [name] certain preventive protocols, quality metrics, and best practice recommendations. A written personalized care plan for preventive services as well as general preventive health recommendations is available and can be mailed to the patient at his request.      Gregory Hock, LPN  [id number]  I have reviewed and agree with the above AWV documentation.   Gregory  Hassell [name], [place]

## 2019-04-06 NOTE — Patient Instructions (Signed)
Preventive Care 83 Years and Older, Male Preventive care refers to lifestyle choices and visits with your health care provider that can promote health and wellness. This includes:  A yearly physical exam. This is also called an annual well check.  Regular dental and eye exams.  Immunizations.  Screening for certain conditions.  Healthy lifestyle choices, such as diet and exercise. What can I expect for my preventive care visit? Physical exam Your health care provider will check:  Height and weight. These may be used to calculate body mass index (BMI), which is a measurement that tells if you are at a healthy weight.  Heart rate and blood pressure.  Your skin for abnormal spots. Counseling Your health care provider may ask you questions about:  Alcohol, tobacco, and drug use.  Emotional well-being.  Home and relationship well-being.  Sexual activity.  Eating habits.  History of falls.  Memory and ability to understand (cognition).  Work and work Statistician. What immunizations do I need?  Influenza (flu) vaccine  This is recommended every year. Tetanus, diphtheria, and pertussis (Tdap) vaccine  You may need a Td booster every 10 years. Varicella (chickenpox) vaccine  You may need this vaccine if you have not already been vaccinated. Zoster (shingles) vaccine  You may need this after age 50. Pneumococcal conjugate (PCV13) vaccine  One dose is recommended after age 24. Pneumococcal polysaccharide (PPSV23) vaccine  One dose is recommended after age 33. Measles, mumps, and rubella (MMR) vaccine  You may need at least one dose of MMR if you were born in 1957 or later. You may also need a second dose. Meningococcal conjugate (MenACWY) vaccine  You may need this if you have certain conditions. Hepatitis A vaccine  You may need this if you have certain conditions or if you travel or work in places where you may be exposed to hepatitis A. Hepatitis B vaccine   You may need this if you have certain conditions or if you travel or work in places where you may be exposed to hepatitis B. Haemophilus influenzae type b (Hib) vaccine  You may need this if you have certain conditions. You may receive vaccines as individual doses or as more than one vaccine together in one shot (combination vaccines). Talk with your health care provider about the risks and benefits of combination vaccines. What tests do I need? Blood tests  Lipid and cholesterol levels. These may be checked every 5 years, or more frequently depending on your overall health.  Hepatitis C test.  Hepatitis B test. Screening  Lung cancer screening. You may have this screening every year starting at age 74 if you have a 30-pack-year history of smoking and currently smoke or have quit within the past 15 years.  Colorectal cancer screening. All adults should have this screening starting at age 57 and continuing until age 54. Your health care provider may recommend screening at age 47 if you are at increased risk. You will have tests every 1-10 years, depending on your results and the type of screening test.  Prostate cancer screening. Recommendations will vary depending on your family history and other risks.  Diabetes screening. This is done by checking your blood sugar (glucose) after you have not eaten for a while (fasting). You may have this done every 1-3 years.  Abdominal aortic aneurysm (AAA) screening. You may need this if you are a current or former smoker.  Sexually transmitted disease (STD) testing. Follow these instructions at home: Eating and drinking  Eat  a diet that includes fresh fruits and vegetables, whole grains, lean protein, and low-fat dairy products. Limit your intake of foods with high amounts of sugar, saturated fats, and salt.  Take vitamin and mineral supplements as recommended by your health care provider.  Do not drink alcohol if your health care provider  tells you not to drink.  If you drink alcohol: ? Limit how much you have to 0-2 drinks a day. ? Be aware of how much alcohol is in your drink. In the [place] one drink equals one 12 oz bottle of beer (355 mL), one 5 oz glass of wine (148 mL), or one 1 oz glass of hard liquor (44 mL). Lifestyle  Take daily care of your teeth and gums.  Stay active. Exercise for at least 30 minutes on 5 or more days each week.  Do not use any products that contain nicotine or tobacco, such as cigarettes, e-cigarettes, and chewing tobacco. If you need help quitting, ask your health care provider.  If you are sexually active, practice safe sex. Use a condom or other form of protection to prevent STIs (sexually transmitted infections).  Talk with your health care provider about taking a low-dose aspirin or statin. What's next?  Visit your health care provider once a year for a well check visit.  Ask your health care provider how often you should have your eyes and teeth checked.  Stay up to date on all vaccines. This information is not intended to replace advice given to you by your health care provider. Make sure you discuss any questions you have with your health care provider. Document Released: 07/12/2015 Document Revised: 06/09/2018 Document Reviewed: 06/09/2018 Elsevier Patient Education  2020 Elsevier Inc.  

## 2019-04-13 ENCOUNTER — Other Ambulatory Visit: Payer: Self-pay

## 2019-04-14 ENCOUNTER — Ambulatory Visit (INDEPENDENT_AMBULATORY_CARE_PROVIDER_SITE_OTHER): Payer: Medicare Other | Admitting: *Deleted

## 2019-04-14 DIAGNOSIS — Z23 Encounter for immunization: Secondary | ICD-10-CM

## 2019-05-04 ENCOUNTER — Ambulatory Visit (INDEPENDENT_AMBULATORY_CARE_PROVIDER_SITE_OTHER): Payer: Medicare Other | Admitting: Family Medicine

## 2019-05-04 DIAGNOSIS — E039 Hypothyroidism, unspecified: Secondary | ICD-10-CM

## 2019-05-04 DIAGNOSIS — I10 Essential (primary) hypertension: Secondary | ICD-10-CM | POA: Diagnosis not present

## 2019-05-04 DIAGNOSIS — Z953 Presence of xenogenic heart valve: Secondary | ICD-10-CM

## 2019-05-04 MED ORDER — EZETIMIBE 10 MG PO TABS: 10.0000 mg | ORAL_TABLET | Freq: Every day | ORAL | 3 refills | Status: DC

## 2019-05-04 MED ORDER — LEVOTHYROXINE SODIUM 75 MCG PO TABS: 75.0000 ug | ORAL_TABLET | Freq: Every day | ORAL | 1 refills | Status: DC

## 2019-05-04 NOTE — Progress Notes (Signed)
Subjective:    Patient ID: [name], male    DOB: 08/01/1929, 85 y.o.   MRN: [id number]   HPI: [name] is a 85 y.o. male presenting for  presents for  follow-up of hypertension. Patient has no history of headache chest pain or shortness of breath or recent cough. Patient also denies symptoms of TIA such as focal numbness or weakness. Patient denies side effects from medication. States taking it regularly. follow-up on  thyroid. The patient has a history of hypothyroidism for many years. It has been stable recently. Pt. denies any change in  voice, loss of hair, heat or cold intolerance. Energy level has been adequate to good. Patient denies constipation and diarrhea. No myxedema. Medication is as noted below. Verified that pt is taking it daily on an empty stomach. Well tolerated.   132/68 HR 63. Weight is 140  Depression screen [facility] 2/9 04/06/2019 04/18/2018 02/03/2018 12/13/2017 09/10/2017  Decreased Interest 0 0 0 0 0  Down, Depressed, Hopeless 0 0 0 0 0  PHQ - 2 Score 0 0 0 0 0     Relevant past medical, surgical, family and social history reviewed and updated as indicated.  Interim medical history since our last visit reviewed. Allergies and medications reviewed and updated.  ROS:  Review of Systems  Constitutional: Negative.   HENT: Negative.   Eyes: Negative for visual disturbance.  Respiratory: Negative for cough and shortness of breath.   Cardiovascular: Negative for chest pain and leg swelling.  Gastrointestinal: Negative for abdominal pain, diarrhea, nausea and vomiting.  Genitourinary: Negative for difficulty urinating.  Musculoskeletal: Negative for arthralgias and myalgias.  Skin: Negative for rash.  Neurological: Negative for headaches.  Psychiatric/Behavioral: Negative for sleep disturbance.     Social History   Tobacco Use  Smoking Status Never Smoker  Smokeless Tobacco Never Used  Tobacco Comment   tobacco use - no       Objective:     Wt  Readings from Last 3 Encounters:  07/18/18 139 lb 12.8 oz (63.4 kg)  04/18/18 137 lb 6.4 oz (62.3 kg)  02/03/18 138 lb (62.6 kg)     Exam deferred. Pt. Harboring due to COVID 19. Phone visit performed.   Assessment & Plan:   1. Essential hypertension   2. S/P aortic valve replacement with bioprosthetic valve   3. Hypothyroidism, unspecified type     Meds ordered this encounter  Medications  . levothyroxine (EUTHYROX) 75 MCG tablet    Sig: Take 1 tablet (75 mcg total) by mouth daily. (Please make your OCt appt)    Dispense:  90 tablet    Refill:  1  . ezetimibe (ZETIA) 10 MG tablet    Sig: Take 1 tablet (10 mg total) by mouth daily. TAKE 1 TABLET (10 MG TOTAL) BY MOUTH DAILY.    Dispense:  90 tablet    Refill:  3    No orders of the defined types were placed in this encounter.     Diagnoses and all orders for this visit:  Essential hypertension  S/P aortic valve replacement with bioprosthetic valve  Hypothyroidism, unspecified type  Other orders -     levothyroxine (EUTHYROX) 75 MCG tablet; Take 1 tablet (75 mcg total) by mouth daily. (Please make your OCt appt) -     ezetimibe (ZETIA) 10 MG tablet; Take 1 tablet (10 mg total) by mouth daily. TAKE 1 TABLET (10 MG TOTAL) BY MOUTH DAILY.    Virtual Visit  via telephone Note  I discussed the limitations, risks, security and privacy concerns of performing an evaluation and management service by telephone and the availability of in person appointments. The patient was identified with two identifiers. Pt.expressed understanding and agreed to proceed. Pt. Is at home. He is hard of hearing so his wife spoke to him for me and gave me his answers. Dr. Livia Snellen is in his office.  Follow Up Instructions:   I discussed the assessment and treatment plan with the patient. The patient was provided an opportunity to ask questions and all were answered. The patient agreed with the plan and demonstrated an understanding of the  instructions.   The patient was advised to call back or seek an in-person evaluation if the symptoms worsen or if the condition fails to improve as anticipated.   Total minutes including chart review and phone contact time: 18   Follow up plan: No follow-ups on file.  [name], MD [place]

## 2019-05-07 ENCOUNTER — Encounter: Payer: Self-pay | Admitting: Family Medicine

## 2019-06-01 DIAGNOSIS — H0288B Meibomian gland dysfunction left eye, upper and lower eyelids: Secondary | ICD-10-CM | POA: Diagnosis not present

## 2019-06-01 DIAGNOSIS — H0288A Meibomian gland dysfunction right eye, upper and lower eyelids: Secondary | ICD-10-CM | POA: Diagnosis not present

## 2019-06-01 DIAGNOSIS — H401133 Primary open-angle glaucoma, bilateral, severe stage: Secondary | ICD-10-CM | POA: Diagnosis not present

## 2019-06-01 DIAGNOSIS — H04123 Dry eye syndrome of bilateral lacrimal glands: Secondary | ICD-10-CM | POA: Diagnosis not present

## 2019-06-21 ENCOUNTER — Other Ambulatory Visit: Payer: Self-pay | Admitting: Cardiology

## 2019-07-19 ENCOUNTER — Telehealth: Payer: Self-pay | Admitting: Cardiology

## 2019-07-19 NOTE — Telephone Encounter (Signed)
New Message    pts wife is calling and says she will need to assist him to his appt because he can hardly hear or see     Please call back

## 2019-07-19 NOTE — Telephone Encounter (Signed)
Pts wife advised okay to assist him to his appt with Dr. Stanford Breed 07/24/19.

## 2019-07-19 NOTE — Progress Notes (Signed)
HPI: FU AVR (h/o AS); hx of carotid stenosis s/p CEA, HTN, HL. He is intol to statins. Echo 1/15 with normal LV function and severe AS (mean 52). LHC demonstrated non-obstructive CAD. Had AVR 3/15. Postoperative course was complicated by atrial fibrillation. He converted to NSR on amiodarone.  Studies: - LHC (07/26/13): Left Main calcified, proximal mid and distal LAD 50, circumflex and RCA patent, EF 60%.   - Carotid US (4/19):1-39 bilateralICA stenosis.   -Echocardiogram postoperatively in April of 2015 showed normal LV function, grade 1 diastolic dysfunction, bioprosthetic aortic valve with a mean gradient of 9 mm of mercury. There was mild mitral regurgitation and mild left atrial enlargement.   Since last seen,patient denies dyspnea, chest pain, palpitations or syncope.  Current Outpatient Medications  Medication Sig Dispense Refill  . aspirin 81 MG tablet Take 81 mg by mouth daily.    . dorzolamide (TRUSOPT) 2 % ophthalmic solution Place 1 drop into the left eye 3 (three) times daily.   3  . ezetimibe (ZETIA) 10 MG tablet Take 1 tablet (10 mg total) by mouth daily. TAKE 1 TABLET (10 MG TOTAL) BY MOUTH DAILY. 90 tablet 3  . levothyroxine (EUTHYROX) 75 MCG tablet Take 1 tablet (75 mcg total) by mouth daily. (Please make your OCt appt) 90 tablet 1  . metoprolol tartrate (LOPRESSOR) 50 MG tablet Take 1/2 (one-half) tablet by mouth twice daily 90 tablet 2  . Multiple Vitamins-Minerals (CENTRUM SILVER PO) Take by mouth.    . Niacin CR 1000 MG TBCR Take 1 tablet by mouth once daily 90 tablet 0  . ramipril (ALTACE) 5 MG capsule Take 1 capsule by mouth once daily 90 capsule 2  . TIMOLOL MALEATE OP Place 1 drop into the left eye 2 (two) times daily. LEFT EYE     . Travoprost, BAK Free, (TRAVATAN) 0.004 % SOLN ophthalmic solution Place 1 drop into the left eye at bedtime.      No current facility-administered medications for this visit.     Past Medical History:  Diagnosis  Date  . Aortic stenosis    a. s/p tissue AVR 08/2013;  b. Echo (09/2013):  Mild LVH, EF 55-60%, no RWMA, Gr 1 DD, AVR ok (mean gradient 9 mmHg), mild MR, mild LAE, mildly reduced RVSF, mild TR, PASP 32 mmHg.  Marland Kitchen CAD (coronary artery disease)   . Cerebrovascular disease, unspecified   . Glaucoma    Bilateral eyes  . HLD (hyperlipidemia)   . HOH (hard of hearing)    wears bilateral hearing aids  . HTN (hypertension)   . Pneumonia July 23, 2013  . S/P aortic valve replacement with bioprosthetic valve 09/13/2013   25 mm [facility] Ease bovine bioprosthetic tissue valve  . Skin cancer of face    removed from nose    Past Surgical History:  Procedure Laterality Date  . AORTIC VALVE REPLACEMENT N/A 09/13/2013   Procedure: AORTIC VALVE REPLACEMENT (AVR);  Surgeon: Rexene Alberts, MD;  Location: [place];  Service: Open Heart Surgery;  Laterality: N/A;  . CARDIAC CATHETERIZATION     no PCI  . CAROTID ENDARTERECTOMY Right   . CATARACT EXTRACTION Bilateral   . COLONOSCOPY W/ POLYPECTOMY    . EYE SURGERY    . INTRAOPERATIVE TRANSESOPHAGEAL ECHOCARDIOGRAM N/A 09/13/2013   Procedure: INTRAOPERATIVE TRANSESOPHAGEAL ECHOCARDIOGRAM;  Surgeon: Rexene Alberts, MD;  Location: [place];  Service: Open Heart Surgery;  Laterality: N/A;  . LEFT AND RIGHT HEART CATHETERIZATION WITH  CORONARY ANGIOGRAM N/A 07/26/2013   Procedure: LEFT AND RIGHT HEART CATHETERIZATION WITH CORONARY ANGIOGRAM;  Surgeon: Sinclair Grooms, MD;  Location: [facility] CATH LAB;  Service: Cardiovascular;  Laterality: N/A;  . RETINAL DETACHMENT SURGERY      Social History   Socioeconomic History  . Marital status: Married    Spouse name: Mary  . Number of children: 0  . Years of education: 86  . Highest education level: 12th grade  Occupational History  . Occupation: Retired  Tobacco Use  . Smoking status: Never Smoker  . Smokeless tobacco: Never Used  . Tobacco comment: tobacco use - no  Substance and Sexual Activity  . Alcohol  use: No    Alcohol/week: 0.0 standard drinks  . Drug use: No  . Sexual activity: Not Currently  Other Topics Concern  . Not on file  Social History Narrative   Married.    Social Determinants of Health   Financial Resource Strain:   . Difficulty of Paying Living Expenses: Not on file  Food Insecurity:   . Worried About Charity fundraiser in the Last Year: Not on file  . Ran Out of Food in the Last Year: Not on file  Transportation Needs:   . Lack of Transportation (Medical): Not on file  . Lack of Transportation (Non-Medical): Not on file  Physical Activity: Unknown  . Days of Exercise per Week: 5 days  . Minutes of Exercise per Session: Not on file  Stress:   . Feeling of Stress : Not on file  Social Connections: Unknown  . Frequency of Communication with Friends and Family: Not on file  . Frequency of Social Gatherings with Friends and Family: More than three times a week  . Attends Religious Services: Not on file  . Active Member of Clubs or Organizations: Not on file  . Attends Archivist Meetings: Not on file  . Marital Status: Not on file  Intimate Partner Violence:   . Fear of Current or Ex-Partner: Not on file  . Emotionally Abused: Not on file  . Physically Abused: Not on file  . Sexually Abused: Not on file    Family History  Problem Relation Age of Onset  . Colon cancer Father   . Heart failure Mother        CHF   . Birth defects Sister     ROS: Problems with hearing, vision and overall weakness but no fevers or chills, productive cough, hemoptysis, dysphasia, odynophagia, melena, hematochezia, dysuria, hematuria, rash, seizure activity, orthopnea, PND, pedal edema, claudication. Remaining systems are negative.  Physical Exam: Well-developed frail in no acute distress.  Skin is warm and dry.  HEENT is normal.  Neck is supple.  Chest is clear to auscultation with normal expansion.  Cardiovascular exam is regular rate and rhythm.  Abdominal  exam nontender or distended. No masses palpated. Extremities show no edema. neuro grossly intact  ECG-sinus rhythm with PACs, left bundle branch block.  Personally reviewed  A/P  1 aortic valve replacement-patient doing well.  Continue SBE prophylaxis.  Repeat echocardiogram.  2 Coronary artery disease-no chest pain.  Continue aspirin.  He is intolerant to statins.  3 hypertension-blood pressure reasonable for age.  Continue present medications and follow.  Check potassium and renal function.  4 hyperlipidemia-intolerant to statins.  Continue Zetia.  Check lipids and liver.  5 carotid artery disease-mild on most recent Dopplers.  We will arrange follow-up study.  Kirk Ruths, MD

## 2019-07-24 ENCOUNTER — Ambulatory Visit: Payer: Medicare Other | Admitting: Cardiology

## 2019-07-24 ENCOUNTER — Encounter: Payer: Self-pay | Admitting: Cardiology

## 2019-07-24 ENCOUNTER — Other Ambulatory Visit: Payer: Self-pay

## 2019-07-24 VITALS — BP 148/79 | HR 81 | Ht 68.0 in | Wt 139.6 lb

## 2019-07-24 DIAGNOSIS — I2510 Atherosclerotic heart disease of native coronary artery without angina pectoris: Secondary | ICD-10-CM | POA: Diagnosis not present

## 2019-07-24 DIAGNOSIS — E7800 Pure hypercholesterolemia, unspecified: Secondary | ICD-10-CM

## 2019-07-24 DIAGNOSIS — I359 Nonrheumatic aortic valve disorder, unspecified: Secondary | ICD-10-CM

## 2019-07-24 DIAGNOSIS — I10 Essential (primary) hypertension: Secondary | ICD-10-CM

## 2019-07-24 NOTE — Patient Instructions (Addendum)
Medication Instructions:  NO CHANGES *If you need a refill on your cardiac medications before your next appointment, please call your pharmacy*  Lab Work: LIPIDS, LIVER, BMET TODAY If you have labs (blood work) drawn today and your tests are completely normal, you will receive your results only by: Marland Kitchen MyChart Message (if you have MyChart) OR . A paper copy in the mail If you have any lab test that is abnormal or we need to change your treatment, we will call you to review the results.  Testing/Procedures: Your physician has requested that you have an echocardiogram. Echocardiography is a painless test that uses sound waves to create images of your heart. It provides your doctor with information about the size and shape of your heart and how well your heart's chambers and valves are working. This procedure takes approximately one hour. There are no restrictions for this procedure.  [place] has requested that you have a carotid duplex. This test is an ultrasound of the carotid arteries in your neck. It looks at blood flow through these arteries that supply the brain with blood. Allow one hour for this exam. There are no restrictions or special instructions.  THESE ARE DONE AT DR. CRENSHAW'S OFFICE  Follow-Up: At [facility], you and your health needs are our priority.  As part of our continuing mission to provide you with exceptional heart care, we have created designated Provider Care Teams.  These Care Teams include your primary Cardiologist (physician) and Advanced Practice Providers (APPs -  Physician Assistants and Nurse Practitioners) who all work together to provide you with the care you need, when you need it.  Your next appointment:   12 month(s)  The format for your next appointment:   In Person  Provider:   Kirk Ruths, MD

## 2019-07-25 ENCOUNTER — Encounter: Payer: Self-pay | Admitting: *Deleted

## 2019-07-25 LAB — HEPATIC FUNCTION PANEL
ALT: 14 IU/L (ref 0–44)
AST: 21 IU/L (ref 0–40)
Albumin: 4.5 g/dL (ref 3.6–4.6)
Alkaline Phosphatase: 45 IU/L (ref 39–117)
Bilirubin Total: 0.6 mg/dL (ref 0.0–1.2)
Bilirubin, Direct: 0.19 mg/dL (ref 0.00–0.40)
Total Protein: 6.8 g/dL (ref 6.0–8.5)

## 2019-07-25 LAB — BASIC METABOLIC PANEL
BUN/Creatinine Ratio: 25 — ABNORMAL HIGH (ref 10–24)
BUN: 28 mg/dL — ABNORMAL HIGH (ref 8–27)
CO2: 25 mmol/L (ref 20–29)
Calcium: 9.4 mg/dL (ref 8.6–10.2)
Chloride: 104 mmol/L (ref 96–106)
Creatinine, Ser: 1.13 mg/dL (ref 0.76–1.27)
GFR calc Af Amer: 67 mL/min/{1.73_m2} (ref >59)
GFR calc non Af Amer: 58 mL/min/{1.73_m2} — ABNORMAL LOW (ref >59)
Glucose: 99 mg/dL (ref 65–99)
Potassium: 4.4 mmol/L (ref 3.5–5.2)
Sodium: 145 mmol/L — ABNORMAL HIGH (ref 134–144)

## 2019-07-25 LAB — LIPID PANEL
Chol/HDL Ratio: 2.5 ratio (ref 0.0–5.0)
Cholesterol, Total: 150 mg/dL (ref 100–199)
HDL: 61 mg/dL (ref >39)
LDL Chol Calc (NIH): 74 mg/dL (ref 0–99)
Triglycerides: 80 mg/dL (ref 0–149)
VLDL Cholesterol Cal: 15 mg/dL (ref 5–40)

## 2019-07-27 ENCOUNTER — Other Ambulatory Visit: Payer: Self-pay

## 2019-07-27 ENCOUNTER — Ambulatory Visit (HOSPITAL_COMMUNITY)
Admission: RE | Admit: 2019-07-27 | Discharge: 2019-07-27 | Disposition: A | Discharge status: Home / Self Care | Payer: Medicare Other | Source: Ambulatory Visit | Attending: Cardiology | Admitting: Cardiology

## 2019-07-27 DIAGNOSIS — I359 Nonrheumatic aortic valve disorder, unspecified: Secondary | ICD-10-CM

## 2019-07-27 DIAGNOSIS — I2510 Atherosclerotic heart disease of native coronary artery without angina pectoris: Secondary | ICD-10-CM

## 2019-07-27 NOTE — Progress Notes (Signed)
*  PRELIMINARY RESULTS* Echocardiogram 2D Echocardiogram has been performed.  [name] 07/27/2019, 4:07 PM

## 2019-08-01 ENCOUNTER — Other Ambulatory Visit (HOSPITAL_COMMUNITY): Payer: Self-pay | Admitting: Cardiology

## 2019-08-01 ENCOUNTER — Other Ambulatory Visit: Payer: Self-pay

## 2019-08-01 ENCOUNTER — Other Ambulatory Visit: Payer: Self-pay | Admitting: *Deleted

## 2019-08-01 ENCOUNTER — Ambulatory Visit (HOSPITAL_COMMUNITY)
Admission: RE | Admit: 2019-08-01 | Discharge: 2019-08-01 | Disposition: A | Discharge status: Home / Self Care | Payer: Medicare Other | Source: Ambulatory Visit | Attending: Internal Medicine | Admitting: Internal Medicine

## 2019-08-01 ENCOUNTER — Encounter: Payer: Self-pay | Admitting: *Deleted

## 2019-08-01 DIAGNOSIS — Z9889 Other specified postprocedural states: Secondary | ICD-10-CM

## 2019-08-01 DIAGNOSIS — I359 Nonrheumatic aortic valve disorder, unspecified: Secondary | ICD-10-CM | POA: Insufficient documentation

## 2019-08-01 DIAGNOSIS — I2510 Atherosclerotic heart disease of native coronary artery without angina pectoris: Secondary | ICD-10-CM

## 2019-08-01 DIAGNOSIS — I679 Cerebrovascular disease, unspecified: Secondary | ICD-10-CM

## 2019-08-31 DIAGNOSIS — H0288B Meibomian gland dysfunction left eye, upper and lower eyelids: Secondary | ICD-10-CM | POA: Diagnosis not present

## 2019-08-31 DIAGNOSIS — H0288A Meibomian gland dysfunction right eye, upper and lower eyelids: Secondary | ICD-10-CM | POA: Diagnosis not present

## 2019-08-31 DIAGNOSIS — H401133 Primary open-angle glaucoma, bilateral, severe stage: Secondary | ICD-10-CM | POA: Diagnosis not present

## 2019-08-31 DIAGNOSIS — H04123 Dry eye syndrome of bilateral lacrimal glands: Secondary | ICD-10-CM | POA: Diagnosis not present

## 2019-10-12 ENCOUNTER — Other Ambulatory Visit: Payer: Self-pay | Admitting: Cardiology

## 2019-10-12 DIAGNOSIS — I10 Essential (primary) hypertension: Secondary | ICD-10-CM

## 2019-10-12 DIAGNOSIS — I2510 Atherosclerotic heart disease of native coronary artery without angina pectoris: Secondary | ICD-10-CM

## 2019-10-12 DIAGNOSIS — I679 Cerebrovascular disease, unspecified: Secondary | ICD-10-CM

## 2019-10-12 DIAGNOSIS — Z953 Presence of xenogenic heart valve: Secondary | ICD-10-CM

## 2019-10-17 ENCOUNTER — Other Ambulatory Visit: Payer: Self-pay | Admitting: Cardiology

## 2019-10-19 NOTE — Telephone Encounter (Signed)
Rx(s) sent to pharmacy electronically.  

## 2019-11-30 ENCOUNTER — Ambulatory Visit: Payer: Medicare Other | Admitting: Family Medicine

## 2020-03-21 ENCOUNTER — Other Ambulatory Visit: Payer: Self-pay | Admitting: Family Medicine

## 2020-04-08 ENCOUNTER — Ambulatory Visit (INDEPENDENT_AMBULATORY_CARE_PROVIDER_SITE_OTHER): Payer: Medicare Other

## 2020-04-08 DIAGNOSIS — Z0000 Encounter for general adult medical examination without abnormal findings: Secondary | ICD-10-CM

## 2020-04-08 NOTE — Progress Notes (Signed)
MEDICARE ANNUAL WELLNESS VISIT  04/08/2020  Telephone Visit Disclaimer This Medicare AWV was conducted by telephone due to national recommendations for restrictions regarding the COVID-19 Pandemic (e.g. social distancing).  I verified, using two identifiers, that I am speaking with [name] or their authorized healthcare agent. I discussed the limitations, risks, security, and privacy concerns of performing an evaluation and management service by telephone and the potential availability of an in-person appointment in the future. The patient expressed understanding and agreed to proceed.  Location of Patient: Home Location of Provider (nurse):  [facility]  Subjective:    [name] is a 86 y.o. male patient of Stacks, Cletus Gash, MD who had a Medicare Annual Wellness Visit today via telephone. Juluis is Retired and lives with their spouse. he has no children. he reports that he is socially active and does interact with friends/family regularly. he is minimally physically active and enjoys word puzzels.  Patient Care Team: [name], MD as PCP - General (Family Medicine) Stanford Breed Denice Bors, MD as PCP - Cardiology (Cardiology) Dorann Ou, MD as Consulting Physician (Ophthalmology) Calvert Cantor, MD as Consulting Physician (Ophthalmology)  Advanced Directives 04/08/2020 04/06/2019 01/25/2017 06/12/2016 09/13/2013 09/11/2013 07/25/2013  Does Patient Have a Medical Advance Directive? Yes No No No Patient does not have advance directive;Patient would not like information Patient does not have advance directive;Patient would not like information Patient does not have advance directive  Type of Advance Directive [place];Living will - - - - - -  Does patient want to make changes to medical advance directive? No - Patient declined - - - - - -  Would patient like information on creating a medical advance directive? - No - Patient declined Yes  (MAU/Ambulatory/Procedural Areas - Information given) Yes (MAU/Ambulatory/Procedural Areas - Information given) - - -  Pre-existing out of facility DNR order (yellow form or pink MOST form) - - - - - - No    Hospital Utilization Over the Past 12 Months: # of hospitalizations or ER visits: 0 # of surgeries: 0  Review of Systems    Patient reports that his overall health is unchanged compared to last year.  Negative except Mr. Montanari wife manages the home and finances. She also manages patient medications and drives him to appointments. The patient is hard of hearing and does not see that well per wife. Mr. Pernell can still bathe,dress and feed himself.  Patient Reported Readings (BP, Pulse, CBG, Weight, etc) none  Pain Assessment Pain : No/denies pain     Current Medications & Allergies (verified) Allergies as of 04/08/2020      Reactions   Levaquin [levofloxacin]    RASH      Medication List       Accurate as of April 08, 2020 10:35 AM. If you have any questions, ask your nurse or doctor.        aspirin 81 MG tablet Take 81 mg by mouth daily.   CENTRUM SILVER PO Take by mouth.   dorzolamide 2 % ophthalmic solution Commonly known as: TRUSOPT Place 1 drop into the left eye 3 (three) times daily.   ezetimibe 10 MG tablet Commonly known as: ZETIA Take 1 tablet by mouth once daily   levothyroxine 75 MCG tablet Commonly known as: SYNTHROID Take 1 tablet (75 mcg total) by mouth daily. (Please make your OCt appt)   metoprolol tartrate 50 MG tablet Commonly known as: LOPRESSOR Take 1/2 (one-half) tablet by  mouth twice daily   Niacin CR 1000 MG Tbcr Take 1 tablet by mouth once daily   ramipril 5 MG capsule Commonly known as: ALTACE Take 1 capsule by mouth once daily   TIMOLOL MALEATE OP Place 1 drop into the left eye 2 (two) times daily. LEFT EYE   Travoprost (BAK Free) 0.004 % Soln ophthalmic solution Commonly known as: TRAVATAN Place 1 drop into the left  eye at bedtime.       History (reviewed): Past Medical History:  Diagnosis Date  . Aortic stenosis    a. s/p tissue AVR 08/2013;  b. Echo (09/2013):  Mild LVH, EF 55-60%, no RWMA, Gr 1 DD, AVR ok (mean gradient 9 mmHg), mild MR, mild LAE, mildly reduced RVSF, mild TR, PASP 32 mmHg.  Marland Kitchen CAD (coronary artery disease)   . Cerebrovascular disease, unspecified   . Glaucoma    Bilateral eyes  . HLD (hyperlipidemia)   . HOH (hard of hearing)    wears bilateral hearing aids  . HTN (hypertension)   . Pneumonia July 23, 2013  . S/P aortic valve replacement with bioprosthetic valve 09/13/2013   25 mm [facility] Ease bovine bioprosthetic tissue valve  . Skin cancer of face    removed from nose   Past Surgical History:  Procedure Laterality Date  . AORTIC VALVE REPLACEMENT N/A 09/13/2013   Procedure: AORTIC VALVE REPLACEMENT (AVR);  Surgeon: Rexene Alberts, MD;  Location: [place];  Service: Open Heart Surgery;  Laterality: N/A;  . CARDIAC CATHETERIZATION     no PCI  . CAROTID ENDARTERECTOMY Right   . CATARACT EXTRACTION Bilateral   . COLONOSCOPY W/ POLYPECTOMY    . EYE SURGERY    . INTRAOPERATIVE TRANSESOPHAGEAL ECHOCARDIOGRAM N/A 09/13/2013   Procedure: INTRAOPERATIVE TRANSESOPHAGEAL ECHOCARDIOGRAM;  Surgeon: Rexene Alberts, MD;  Location: [place];  Service: Open Heart Surgery;  Laterality: N/A;  . LEFT AND RIGHT HEART CATHETERIZATION WITH CORONARY ANGIOGRAM N/A 07/26/2013   Procedure: LEFT AND RIGHT HEART CATHETERIZATION WITH CORONARY ANGIOGRAM;  Surgeon: Sinclair Grooms, MD;  Location: [facility] CATH LAB;  Service: Cardiovascular;  Laterality: N/A;  . RETINAL DETACHMENT SURGERY     Family History  Problem Relation Age of Onset  . Colon cancer Father   . Heart failure Mother        CHF   . Birth defects Sister    Social History   Socioeconomic History  . Marital status: Married    Spouse name: Mary  . Number of children: 0  . Years of education: 32  . Highest education level: 12th  grade  Occupational History  . Occupation: Retired  Tobacco Use  . Smoking status: Never Smoker  . Smokeless tobacco: Never Used  . Tobacco comment: tobacco use - no  Vaping Use  . Vaping Use: Never used  Substance and Sexual Activity  . Alcohol use: No    Alcohol/week: 0.0 standard drinks  . Drug use: No  . Sexual activity: Not Currently  Other Topics Concern  . Not on file  Social History Narrative   Married.    Social Determinants of Health   Financial Resource Strain:   . Difficulty of Paying Living Expenses: Not on file  Food Insecurity:   . Worried About Charity fundraiser in the Last Year: Not on file  . Ran Out of Food in the Last Year: Not on file  Transportation Needs:   . Lack of Transportation (Medical): Not on file  .  Lack of Transportation (Non-Medical): Not on file  Physical Activity:   . Days of Exercise per Week: Not on file  . Minutes of Exercise per Session: Not on file  Stress:   . Feeling of Stress : Not on file  Social Connections:   . Frequency of Communication with Friends and Family: Not on file  . Frequency of Social Gatherings with Friends and Family: Not on file  . Attends Religious Services: Not on file  . Active Member of Clubs or Organizations: Not on file  . Attends Archivist Meetings: Not on file  . Marital Status: Not on file    Activities of Daily Living In your present state of health, do you have any difficulty performing the following activities: 04/08/2020  Hearing? Y  Vision? Y  Difficulty concentrating or making decisions? Y  Walking or climbing stairs? N  Dressing or bathing? N  Doing errands, shopping? Y  Comment Wife drives patient to appointments and outings  Preparing Food and eating ? N  Using the Toilet? Y  In the past six months, have you accidently leaked urine? Y  Do you have problems with loss of bowel control? N  Managing your Medications? Y  Comment Wife manages  Managing your Finances? Y    Housekeeping or managing your Housekeeping? Y  Some recent data might be hidden    Patient Education/ Literacy How often do you need to have someone help you when you read instructions, pamphlets, or other written materials from your doctor or pharmacy?: 3 - Sometimes What is the last grade level you completed in school?: 12th grade  Exercise Current Exercise Habits: Home exercise routine, Type of exercise: walking, Time (Minutes): 15, Frequency (Times/Week): 5, Weekly Exercise (Minutes/Week): 75, Intensity: Mild  Diet Patient reports consuming 3 meals a day and 0 snack(s) a day Patient reports that his primary diet is: Regular Patient reports that she does have regular access to food.   Depression Screen PHQ 2/9 Scores 04/08/2020 04/06/2019 04/18/2018 02/03/2018 12/13/2017 09/10/2017 08/13/2017  PHQ - 2 Score 0 0 0 0 0 0 0     Fall Risk Fall Risk  04/08/2020 04/06/2019 04/18/2018 02/03/2018 12/13/2017  Falls in the past year? 0 0 No Yes No  Comment - - - slipped on step on in the rain -  Number falls in past yr: - 0 - 1 -  Injury with Fall? - 0 - No -  Risk for fall due to : - History of fall(s);Impaired vision - History of fall(s) -  Follow up - Falls prevention discussed - Falls prevention discussed -  Comment - Get rid of all throw rugs in the house, adequate lighting in the walkways and grab bars in the bathroom - - -     Objective:  [name] seemed alert and oriented and he participated appropriately during our telephone visit.  Blood Pressure Weight BMI  BP Readings from Last 3 Encounters:  07/24/19 (!) 148/79  07/18/18 (!) 142/60  04/18/18 115/67   Wt Readings from Last 3 Encounters:  07/24/19 139 lb 9.6 oz (63.3 kg)  07/18/18 139 lb 12.8 oz (63.4 kg)  04/18/18 137 lb 6.4 oz (62.3 kg)   BMI Readings from Last 1 Encounters:  07/24/19 21.23 kg/m    *Unable to obtain current vital signs, weight, and BMI due to telephone visit type  Hearing/Vision  . Daril did  seem  to have difficulty with hearing/understanding during the telephone conversation . Reports that  he has not had a formal eye exam by an eye care professional within the past year . Reports that he has not had a formal hearing evaluation within the past year *Unable to fully assess hearing and vision during telephone visit type  Cognitive Function: 6CIT Screen 04/08/2020 04/06/2019  What Year? 4 points 0 points  What month? 3 points 3 points  What time? 3 points 0 points  Count back from 20 4 points 0 points  Months in reverse 4 points 2 points  Repeat phrase 10 points 4 points  Total Score 28 9   (Normal:0-7, Significant for Dysfunction: >8)  Normal Cognitive Function Screening: No: 28. Wife asked Mr.Ruacho the cognitive questions. Patient kept saying he could not think and tried to look at calendar. He wold not repeat or say anything we asked him to say.  Immunization & Health Maintenance Record Immunization History  Administered Date(s) Administered  . Fluad Quad(high Dose 65+) 04/14/2019  . Influenza, High Dose Seasonal PF 04/15/2017, 04/18/2018  . Influenza,inj,Quad PF,6+ Mos 05/03/2013, 04/16/2014, 04/10/2015, 04/14/2016  . Pneumococcal Conjugate-13 01/23/2015  . Pneumococcal Polysaccharide-23 07/27/2013    Health Maintenance  Topic Date Due  . COVID-19 Vaccine (1) Never done  . TETANUS/TDAP  Never done  . INFLUENZA VACCINE  01/28/2020  . PNA vac Low Risk Adult  Completed       Assessment  This is a routine wellness examination for RAWLEY HARJU.  Health Maintenance: Due or Overdue Health Maintenance Due  Topic Date Due  . COVID-19 Vaccine (1) Never done  . TETANUS/TDAP  Never done  . INFLUENZA VACCINE  01/28/2020    [name] does not need a referral for Community Assistance: Care Management:   no Social Work:    no Prescription Assistance:  no Nutrition/Diabetes Education:  no   Plan:  Personalized Goals Goals Addressed            This Visit's Progress    . Exercise 3x per week (30 min per time)   On track    Continue to stay active. Try to walk for several times per week as the weather permits.       Personalized Health Maintenance & Screening Recommendations    Lung Cancer Screening Recommended: no (Low Dose CT Chest recommended if Age 9-80 years, 30 pack-year currently smoking OR have quit w/in past 15 years) Hepatitis C Screening recommended: no HIV Screening recommended: no  Advanced Directives: Written information was not prepared per patient's request.  Referrals & Orders No orders of the defined types were placed in this encounter.   Follow-up Plan . Follow-up with [name], MD as planned . Schedule 05/09/2020   I have personally reviewed and noted the following in the patient's chart:   . Medical and social history . Use of alcohol, tobacco or illicit drugs  . Current medications and supplements . Functional ability and status . Nutritional status . Physical activity . Advanced directives . List of other physicians . Hospitalizations, surgeries, and ER visits in previous 12 months . Vitals . Screenings to include cognitive, depression, and falls . Referrals and appointments  In addition, I have reviewed and discussed with [name] certain preventive protocols, quality metrics, and best practice recommendations. A written personalized care plan for preventive services as well as general preventive health recommendations is available and can be mailed to the patient at his request.      Maud Deed [facility]  [phone]

## 2020-04-08 NOTE — Patient Instructions (Signed)
  [place] Maintenance Summary and Written Plan of Care  Mr. Milbourne ,  Thank you for allowing me to perform your Medicare Annual Wellness Visit and for your ongoing commitment to your health.   Health Maintenance & Immunization History Health Maintenance  Topic Date Due  . COVID-19 Vaccine (1) Never done  . TETANUS/TDAP  Never done  . INFLUENZA VACCINE  01/28/2020  . PNA vac Low Risk Adult  Completed   Immunization History  Administered Date(s) Administered  . Fluad Quad(high Dose 65+) 04/14/2019  . Influenza, High Dose Seasonal PF 04/15/2017, 04/18/2018  . Influenza,inj,Quad PF,6+ Mos 05/03/2013, 04/16/2014, 04/10/2015, 04/14/2016  . Pneumococcal Conjugate-13 01/23/2015  . Pneumococcal Polysaccharide-23 07/27/2013    These are the patient goals that we discussed: Goals Addressed            This Visit's Progress   . Exercise 3x per week (30 min per time)   On track    Continue to stay active. Try to walk for several times per week as the weather permits.         This is a list of Health Maintenance Items that are overdue or due now: Health Maintenance Due  Topic Date Due  . COVID-19 Vaccine (1) Never done  . TETANUS/TDAP  Never done  . INFLUENZA VACCINE  01/28/2020     Orders/Referrals Placed Today: No orders of the defined types were placed in this encounter.  (Contact our referral department at [phone] if you have not spoken with someone about your referral appointment within the next 5 days)    Follow-up Plan  Scheduled with Dr. [name] on 05/09/2020 at 10:45am.

## 2020-04-25 ENCOUNTER — Ambulatory Visit (INDEPENDENT_AMBULATORY_CARE_PROVIDER_SITE_OTHER): Payer: Medicare Other

## 2020-04-25 ENCOUNTER — Other Ambulatory Visit: Payer: Self-pay

## 2020-04-25 DIAGNOSIS — H04123 Dry eye syndrome of bilateral lacrimal glands: Secondary | ICD-10-CM | POA: Diagnosis not present

## 2020-04-25 DIAGNOSIS — H0288A Meibomian gland dysfunction right eye, upper and lower eyelids: Secondary | ICD-10-CM | POA: Diagnosis not present

## 2020-04-25 DIAGNOSIS — H0288B Meibomian gland dysfunction left eye, upper and lower eyelids: Secondary | ICD-10-CM | POA: Diagnosis not present

## 2020-04-25 DIAGNOSIS — Z23 Encounter for immunization: Secondary | ICD-10-CM

## 2020-04-25 DIAGNOSIS — H401133 Primary open-angle glaucoma, bilateral, severe stage: Secondary | ICD-10-CM | POA: Diagnosis not present

## 2020-05-09 ENCOUNTER — Ambulatory Visit: Payer: Medicare Other | Admitting: Family Medicine

## 2020-07-29 NOTE — Progress Notes (Signed)
HPI: FU AVR (h/o AS); hx of carotid stenosis s/p CEA, HTN, HL. He is intol to statins. Echo 1/15 with normal LV function and severe AS (mean 52). LHC demonstrated non-obstructive CAD. Had AVR 3/15. Postoperative course was complicated by atrial fibrillation. He converted to NSR on amiodarone.   Studies: - LHC (07/26/13): Left Main calcified, proximal mid and distal LAD 50, circumflex and RCA patent, EF 60%.   - Carotid US (2/21):1-39 bilateralICA stenosis.   -Echocardiogram January 2021 showed normal LV function, mild left ventricular hypertrophy, grade 2 diastolic dysfunction, mild right ventricular enlargement, mild to moderate left atrial enlargement, mild tricuspid regurgitation, status post aortic valve replacement with no aortic insufficiency and mean gradient 6 mmHg.  Since last seen,patient denies dyspnea, chest pain, palpitations or syncope.  Current Outpatient Medications  Medication Sig Dispense Refill  . aspirin 81 MG tablet Take 81 mg by mouth daily.    . cholecalciferol (VITAMIN D3) 25 MCG (1000 UNIT) tablet Take 1,000 Units by mouth daily.    . dorzolamide (TRUSOPT) 2 % ophthalmic solution Place 1 drop into the left eye 3 (three) times daily.   3  . ezetimibe (ZETIA) 10 MG tablet Take 1 tablet by mouth once daily 90 tablet 2  . levothyroxine (SYNTHROID) 75 MCG tablet Take 1 tablet (75 mcg total) by mouth daily. (Please make your OCt appt) 90 tablet 0  . metoprolol tartrate (LOPRESSOR) 50 MG tablet Take 1/2 (one-half) tablet by mouth twice daily 90 tablet 2  . Multiple Vitamins-Minerals (CENTRUM SILVER PO) Take by mouth.    . Niacin CR 1000 MG TBCR Take 1 tablet by mouth once daily 90 tablet 2  . ramipril (ALTACE) 5 MG capsule Take 1 capsule by mouth once daily 90 capsule 2  . TIMOLOL MALEATE OP Place 1 drop into the left eye 2 (two) times daily. LEFT EYE    . Travoprost, BAK Free, (TRAVATAN) 0.004 % SOLN ophthalmic solution Place 1 drop into the left eye at  bedtime.      No current facility-administered medications for this visit.     Past Medical History:  Diagnosis Date  . Aortic stenosis    a. s/p tissue AVR 08/2013;  b. Echo (09/2013):  Mild LVH, EF 55-60%, no RWMA, Gr 1 DD, AVR ok (mean gradient 9 mmHg), mild MR, mild LAE, mildly reduced RVSF, mild TR, PASP 32 mmHg.  Marland Kitchen CAD (coronary artery disease)   . Cerebrovascular disease, unspecified   . Glaucoma    Bilateral eyes  . HLD (hyperlipidemia)   . HOH (hard of hearing)    wears bilateral hearing aids  . HTN (hypertension)   . Pneumonia July 23, 2013  . S/P aortic valve replacement with bioprosthetic valve 09/13/2013   25 mm [facility] Ease bovine bioprosthetic tissue valve  . Skin cancer of face    removed from nose    Past Surgical History:  Procedure Laterality Date  . AORTIC VALVE REPLACEMENT N/A 09/13/2013   Procedure: AORTIC VALVE REPLACEMENT (AVR);  Surgeon: Rexene Alberts, MD;  Location: [place];  Service: Open Heart Surgery;  Laterality: N/A;  . CARDIAC CATHETERIZATION     no PCI  . CAROTID ENDARTERECTOMY Right   . CATARACT EXTRACTION Bilateral   . COLONOSCOPY W/ POLYPECTOMY    . EYE SURGERY    . INTRAOPERATIVE TRANSESOPHAGEAL ECHOCARDIOGRAM N/A 09/13/2013   Procedure: INTRAOPERATIVE TRANSESOPHAGEAL ECHOCARDIOGRAM;  Surgeon: Rexene Alberts, MD;  Location: [place];  Service: Open Heart  Surgery;  Laterality: N/A;  . LEFT AND RIGHT HEART CATHETERIZATION WITH CORONARY ANGIOGRAM N/A 07/26/2013   Procedure: LEFT AND RIGHT HEART CATHETERIZATION WITH CORONARY ANGIOGRAM;  Surgeon: Sinclair Grooms, MD;  Location: [facility] C[facility];  Service: Cardiovascular;  Laterality: N/A;  . RETINAL DETACHMENT SURGERY      Social History   Socioeconomic History  . Marital status: Married    Spouse name: Mary  . Number of children: 0  . Years of education: 56  . Highest education level: 12th grade  Occupational History  . Occupation: Retired  Tobacco Use  . Smoking status: Never Smoker   . Smokeless tobacco: Never Used  . Tobacco comment: tobacco use - no  Vaping Use  . Vaping Use: Never used  Substance and Sexual Activity  . Alcohol use: No    Alcohol/week: 0.0 standard drinks  . Drug use: No  . Sexual activity: Not Currently  Other Topics Concern  . Not on file  Social History Narrative   Married.    Social Determinants of Health   Financial Resource Strain: Not on file  Food Insecurity: Not on file  Transportation Needs: Not on file  Physical Activity: Not on file  Stress: Not on file  Social Connections: Not on file  Intimate Partner Violence: Not on file    Family History  Problem Relation Age of Onset  . Colon cancer Father   . Heart failure Mother        CHF   . Birth defects Sister     ROS: no fevers or chills, productive cough, hemoptysis, dysphasia, odynophagia, melena, hematochezia, dysuria, hematuria, rash, seizure activity, orthopnea, PND, pedal edema, claudication. Remaining systems are negative.  Physical Exam: Well-developed well-nourished in no acute distress.  Skin is warm and dry.  HEENT is normal.  Neck is supple.  Chest is clear to auscultation with normal expansion.  Cardiovascular exam is regular rate and rhythm.  2/6 systolic murmur. Abdominal exam nontender or distended. No masses palpated. Extremities show no edema. neuro grossly intact  ECG-sinus bradycardia with occasional PAC, left bundle branch block.  Personally reviewed  A/P  1 previous aortic valve replacement-continue SBE prophylaxis.  2 hypertension-patient's blood pressure is controlled.  Continue present medications and follow.  Check potassium and renal function.  3 coronary artery disease-he denies chest pain.  Continue aspirin.  Intolerant to statins.  4 hyperlipidemia-continue Zetia.  Check lipids and liver.  5 carotid artery disease-mild on most recent Dopplers.  Kirk Ruths, MD

## 2020-07-31 ENCOUNTER — Ambulatory Visit (HOSPITAL_COMMUNITY): Payer: Medicare Other

## 2020-08-09 ENCOUNTER — Encounter: Payer: Self-pay | Admitting: Cardiology

## 2020-08-09 ENCOUNTER — Ambulatory Visit: Payer: Medicare Other | Admitting: Cardiology

## 2020-08-09 ENCOUNTER — Other Ambulatory Visit: Payer: Self-pay

## 2020-08-09 VITALS — BP 122/54 | HR 56 | Ht 64.0 in | Wt 137.0 lb

## 2020-08-09 DIAGNOSIS — I2510 Atherosclerotic heart disease of native coronary artery without angina pectoris: Secondary | ICD-10-CM | POA: Diagnosis not present

## 2020-08-09 DIAGNOSIS — E7800 Pure hypercholesterolemia, unspecified: Secondary | ICD-10-CM | POA: Diagnosis not present

## 2020-08-09 DIAGNOSIS — I359 Nonrheumatic aortic valve disorder, unspecified: Secondary | ICD-10-CM

## 2020-08-09 DIAGNOSIS — I10 Essential (primary) hypertension: Secondary | ICD-10-CM | POA: Diagnosis not present

## 2020-08-09 NOTE — Patient Instructions (Signed)
  Lab Work:  Your physician recommends that you HAVE LAB WORK TODAY  If you have labs (blood work) drawn today and your tests are completely normal, you will receive your results only by: MyChart Message (if you have MyChart) OR A paper copy in the mail If you have any lab test that is abnormal or we need to change your treatment, we will call you to review the results.   Follow-Up: At [facility], you and your health needs are our priority.  As part of our continuing mission to provide you with exceptional heart care, we have created designated Provider Care Teams.  These Care Teams include your primary Cardiologist (physician) and Advanced Practice Providers (APPs -  Physician Assistants and Nurse Practitioners) who all work together to provide you with the care you need, when you need it.  We recommend signing up for the patient portal called "MyChart".  Sign up information is provided on this After Visit Summary.  MyChart is used to connect with patients for Virtual Visits (Telemedicine).  Patients are able to view lab/test results, encounter notes, upcoming appointments, etc.  Non-urgent messages can be sent to your provider as well.   To learn more about what you can do with MyChart, go to [email or web address].    Your next appointment:   12 month(s)  The format for your next appointment:   In Person  Provider:   Brian Crenshaw, MD   

## 2020-08-10 LAB — COMPREHENSIVE METABOLIC PANEL
ALT: 14 IU/L (ref 0–44)
AST: 20 IU/L (ref 0–40)
Albumin/Globulin Ratio: 1.9 (ref 1.2–2.2)
Albumin: 4.4 g/dL (ref 3.6–4.6)
Alkaline Phosphatase: 49 IU/L (ref 44–121)
BUN/Creatinine Ratio: 22 (ref 10–24)
BUN: 25 mg/dL (ref 8–27)
Bilirubin Total: 0.7 mg/dL (ref 0.0–1.2)
CO2: 23 mmol/L (ref 20–29)
Calcium: 9.6 mg/dL (ref 8.6–10.2)
Chloride: 101 mmol/L (ref 96–106)
Creatinine, Ser: 1.16 mg/dL (ref 0.76–1.27)
GFR calc Af Amer: 65 mL/min/{1.73_m2} (ref >59)
GFR calc non Af Amer: 56 mL/min/{1.73_m2} — ABNORMAL LOW (ref >59)
Globulin, Total: 2.3 g/dL (ref 1.5–4.5)
Glucose: 85 mg/dL (ref 65–99)
Potassium: 4.7 mmol/L (ref 3.5–5.2)
Sodium: 141 mmol/L (ref 134–144)
Total Protein: 6.7 g/dL (ref 6.0–8.5)

## 2020-08-10 LAB — LIPID PANEL
Chol/HDL Ratio: 3.1 ratio (ref 0.0–5.0)
Cholesterol, Total: 155 mg/dL (ref 100–199)
HDL: 50 mg/dL (ref >39)
LDL Chol Calc (NIH): 89 mg/dL (ref 0–99)
Triglycerides: 85 mg/dL (ref 0–149)
VLDL Cholesterol Cal: 16 mg/dL (ref 5–40)

## 2020-08-12 ENCOUNTER — Encounter: Payer: Self-pay | Admitting: *Deleted

## 2020-10-04 ENCOUNTER — Other Ambulatory Visit: Payer: Self-pay | Admitting: Cardiology

## 2020-10-04 ENCOUNTER — Other Ambulatory Visit: Payer: Self-pay | Admitting: Family Medicine

## 2020-10-04 DIAGNOSIS — I10 Essential (primary) hypertension: Secondary | ICD-10-CM

## 2020-10-04 DIAGNOSIS — I679 Cerebrovascular disease, unspecified: Secondary | ICD-10-CM

## 2020-10-04 DIAGNOSIS — I2510 Atherosclerotic heart disease of native coronary artery without angina pectoris: Secondary | ICD-10-CM

## 2020-10-04 DIAGNOSIS — I2583 Coronary atherosclerosis due to lipid rich plaque: Secondary | ICD-10-CM

## 2020-10-04 DIAGNOSIS — Z953 Presence of xenogenic heart valve: Secondary | ICD-10-CM

## 2020-11-05 ENCOUNTER — Ambulatory Visit: Payer: Medicare Other | Admitting: Family Medicine

## 2020-12-18 ENCOUNTER — Other Ambulatory Visit: Payer: Self-pay | Admitting: Cardiology

## 2020-12-18 DIAGNOSIS — I2583 Coronary atherosclerosis due to lipid rich plaque: Secondary | ICD-10-CM

## 2020-12-18 DIAGNOSIS — I2510 Atherosclerotic heart disease of native coronary artery without angina pectoris: Secondary | ICD-10-CM

## 2020-12-18 DIAGNOSIS — I10 Essential (primary) hypertension: Secondary | ICD-10-CM

## 2020-12-18 DIAGNOSIS — I679 Cerebrovascular disease, unspecified: Secondary | ICD-10-CM

## 2020-12-18 DIAGNOSIS — Z953 Presence of xenogenic heart valve: Secondary | ICD-10-CM

## 2021-01-23 ENCOUNTER — Telehealth: Payer: Self-pay | Admitting: Family Medicine

## 2021-01-23 NOTE — Telephone Encounter (Signed)
Explained to wife pt will need a F2F visit he has not been in the office since Nov 2020, she tried to get him in her back in May and he refused and cancelled the appt. She thinks he is getting dementia and is getting really stubborn. Made an appt for next Friday, she will talk with his niece and try to get him in here.

## 2021-01-31 ENCOUNTER — Telehealth: Payer: Medicare Other | Admitting: Family Medicine

## 2021-02-03 ENCOUNTER — Other Ambulatory Visit: Payer: Self-pay | Admitting: Cardiology

## 2021-02-03 DIAGNOSIS — I10 Essential (primary) hypertension: Secondary | ICD-10-CM

## 2021-02-10 ENCOUNTER — Telehealth (INDEPENDENT_AMBULATORY_CARE_PROVIDER_SITE_OTHER): Payer: Medicare Other | Admitting: Family Medicine

## 2021-02-10 ENCOUNTER — Encounter: Payer: Self-pay | Admitting: Family Medicine

## 2021-02-10 DIAGNOSIS — F0280 Dementia in other diseases classified elsewhere without behavioral disturbance: Secondary | ICD-10-CM | POA: Diagnosis not present

## 2021-02-10 DIAGNOSIS — G301 Alzheimer's disease with late onset: Secondary | ICD-10-CM | POA: Diagnosis not present

## 2021-02-10 MED ORDER — RIVASTIGMINE TARTRATE 3 MG PO CAPS: 3.0000 mg | ORAL_CAPSULE | Freq: Two times a day (BID) | ORAL | 0 refills | Status: AC

## 2021-02-10 NOTE — Progress Notes (Signed)
    Subjective:    Patient ID: [name], male    DOB: April 16, 1930, 87 y.o.   MRN: [id number]   HPI: [name] is a 87 y.o. male presenting for video visit regarding dementia. Does well in morning. Gets really mixed up. Wants to go home. Wants to know where everyone is. Puts his clothes in the car to go home. (He is living at his home.) Sx exacerbated by poor hearing. He does not repeat himself. He forgets his wife's name, others' names. History given by his wife   Depression screen [facility] 2/9 04/08/2020 04/06/2019 04/18/2018 02/03/2018 12/13/2017  Decreased Interest 0 0 0 0 0  Down, Depressed, Hopeless 0 0 0 0 0  PHQ - 2 Score 0 0 0 0 0     Relevant past medical, surgical, family and social history reviewed and updated as indicated.  Interim medical history since our last visit reviewed. Allergies and medications reviewed and updated.  ROS:  Review of Systems  Unable to perform ROS: Dementia    Social History   Tobacco Use  Smoking Status Never  Smokeless Tobacco Never  Tobacco Comments   tobacco use - no       Objective:     Wt Readings from Last 3 Encounters:  08/09/20 137 lb (62.1 kg)  07/24/19 139 lb 9.6 oz (63.3 kg)  07/18/18 139 lb 12.8 oz (63.4 kg)     Exam deferred. Pt. Harboring due to COVID 19. Phone visit performed.   Assessment & Plan:   1. Late onset Alzheimer's dementia without behavioral disturbance ([place])     Meds ordered this encounter  Medications   rivastigmine (EXELON) 3 MG capsule    Sig: Take 1 capsule (3 mg total) by mouth 2 (two) times daily.    Dispense:  60 capsule    Refill:  0    No orders of the defined types were placed in this encounter.     Diagnoses and all orders for this visit:  Late onset Alzheimer's dementia without behavioral disturbance ([place])  Other orders -     rivastigmine (EXELON) 3 MG capsule; Take 1 capsule (3 mg total) by mouth 2 (two) times daily.   Virtual Visit via telephone Note  I discussed the  limitations, risks, security and privacy concerns of performing an evaluation and management service by telephone and the availability of in person appointments. The patient was identified with two identifiers. Pt.expressed understanding and agreed to proceed. Pt. Is at home. Dr. Livia Snellen is in his office.  Follow Up Instructions:   I discussed the assessment and treatment plan with the patient. The patient was provided an opportunity to ask questions and all were answered. The patient agreed with the plan and demonstrated an understanding of the instructions.   The patient was advised to call back or seek an in-person evaluation if the symptoms worsen or if the condition fails to improve as anticipated.   Total minutes including chart review and phone contact time: 17   Follow up plan: Return in about 1 month (around 03/13/2021).  [name], MD [place]

## 2021-04-18 ENCOUNTER — Inpatient Hospital Stay (HOSPITAL_COMMUNITY)
Admission: EM | Admit: 2021-04-18 | Discharge: 2021-04-29 | DRG: 956 | Disposition: E | Payer: Medicare Other | Attending: General Surgery | Admitting: General Surgery

## 2021-04-18 ENCOUNTER — Emergency Department (HOSPITAL_COMMUNITY): Payer: Medicare Other

## 2021-04-18 ENCOUNTER — Encounter (HOSPITAL_COMMUNITY): Admission: EM | Disposition: E | Payer: Self-pay | Source: Home / Self Care

## 2021-04-18 ENCOUNTER — Inpatient Hospital Stay (HOSPITAL_COMMUNITY): Payer: Medicare Other

## 2021-04-18 ENCOUNTER — Encounter (HOSPITAL_COMMUNITY): Payer: Self-pay | Admitting: Anesthesiology

## 2021-04-18 ENCOUNTER — Other Ambulatory Visit: Payer: Self-pay

## 2021-04-18 ENCOUNTER — Encounter (HOSPITAL_COMMUNITY): Payer: Self-pay

## 2021-04-18 DIAGNOSIS — Z6822 Body mass index (BMI) 22.0-22.9, adult: Secondary | ICD-10-CM

## 2021-04-18 DIAGNOSIS — D696 Thrombocytopenia, unspecified: Secondary | ICD-10-CM | POA: Diagnosis present

## 2021-04-18 DIAGNOSIS — E785 Hyperlipidemia, unspecified: Secondary | ICD-10-CM | POA: Diagnosis present

## 2021-04-18 DIAGNOSIS — W19XXXA Unspecified fall, initial encounter: Secondary | ICD-10-CM | POA: Diagnosis present

## 2021-04-18 DIAGNOSIS — Z952 Presence of prosthetic heart valve: Secondary | ICD-10-CM

## 2021-04-18 DIAGNOSIS — N179 Acute kidney failure, unspecified: Secondary | ICD-10-CM | POA: Diagnosis present

## 2021-04-18 DIAGNOSIS — Z881 Allergy status to other antibiotic agents status: Secondary | ICD-10-CM

## 2021-04-18 DIAGNOSIS — N289 Disorder of kidney and ureter, unspecified: Secondary | ICD-10-CM | POA: Diagnosis present

## 2021-04-18 DIAGNOSIS — M25551 Pain in right hip: Secondary | ICD-10-CM | POA: Diagnosis present

## 2021-04-18 DIAGNOSIS — I4891 Unspecified atrial fibrillation: Secondary | ICD-10-CM | POA: Diagnosis present

## 2021-04-18 DIAGNOSIS — S32431A Displaced fracture of anterior column [iliopubic] of right acetabulum, initial encounter for closed fracture: Secondary | ICD-10-CM | POA: Diagnosis present

## 2021-04-18 DIAGNOSIS — F0390 Unspecified dementia without behavioral disturbance: Secondary | ICD-10-CM | POA: Diagnosis present

## 2021-04-18 DIAGNOSIS — S72001A Fracture of unspecified part of neck of right femur, initial encounter for closed fracture: Secondary | ICD-10-CM | POA: Diagnosis not present

## 2021-04-18 DIAGNOSIS — J9601 Acute respiratory failure with hypoxia: Secondary | ICD-10-CM | POA: Diagnosis not present

## 2021-04-18 DIAGNOSIS — R52 Pain, unspecified: Secondary | ICD-10-CM

## 2021-04-18 DIAGNOSIS — Z781 Physical restraint status: Secondary | ICD-10-CM

## 2021-04-18 DIAGNOSIS — S7001XA Contusion of right hip, initial encounter: Secondary | ICD-10-CM | POA: Diagnosis present

## 2021-04-18 DIAGNOSIS — E039 Hypothyroidism, unspecified: Secondary | ICD-10-CM | POA: Diagnosis present

## 2021-04-18 DIAGNOSIS — I2510 Atherosclerotic heart disease of native coronary artery without angina pectoris: Secondary | ICD-10-CM | POA: Diagnosis present

## 2021-04-18 DIAGNOSIS — Y92013 Bedroom of single-family (private) house as the place of occurrence of the external cause: Secondary | ICD-10-CM | POA: Diagnosis not present

## 2021-04-18 DIAGNOSIS — I10 Essential (primary) hypertension: Secondary | ICD-10-CM | POA: Diagnosis present

## 2021-04-18 DIAGNOSIS — R64 Cachexia: Secondary | ICD-10-CM | POA: Diagnosis present

## 2021-04-18 DIAGNOSIS — H9190 Unspecified hearing loss, unspecified ear: Secondary | ICD-10-CM | POA: Diagnosis present

## 2021-04-18 DIAGNOSIS — Z7189 Other specified counseling: Secondary | ICD-10-CM | POA: Diagnosis not present

## 2021-04-18 DIAGNOSIS — S3282XA Multiple fractures of pelvis without disruption of pelvic ring, initial encounter for closed fracture: Secondary | ICD-10-CM | POA: Diagnosis present

## 2021-04-18 DIAGNOSIS — Z7982 Long term (current) use of aspirin: Secondary | ICD-10-CM

## 2021-04-18 DIAGNOSIS — Z800 Family history of malignant neoplasm of digestive organs: Secondary | ICD-10-CM

## 2021-04-18 DIAGNOSIS — I468 Cardiac arrest due to other underlying condition: Secondary | ICD-10-CM | POA: Diagnosis not present

## 2021-04-18 DIAGNOSIS — J9602 Acute respiratory failure with hypercapnia: Secondary | ICD-10-CM | POA: Diagnosis not present

## 2021-04-18 DIAGNOSIS — Z20822 Contact with and (suspected) exposure to covid-19: Secondary | ICD-10-CM | POA: Diagnosis present

## 2021-04-18 DIAGNOSIS — R1313 Dysphagia, pharyngeal phase: Secondary | ICD-10-CM | POA: Diagnosis present

## 2021-04-18 DIAGNOSIS — T17808A Unspecified foreign body in other parts of respiratory tract causing other injury, initial encounter: Secondary | ICD-10-CM | POA: Diagnosis not present

## 2021-04-18 DIAGNOSIS — H409 Unspecified glaucoma: Secondary | ICD-10-CM | POA: Diagnosis present

## 2021-04-18 DIAGNOSIS — D62 Acute posthemorrhagic anemia: Secondary | ICD-10-CM | POA: Diagnosis not present

## 2021-04-18 DIAGNOSIS — S72031A Displaced midcervical fracture of right femur, initial encounter for closed fracture: Secondary | ICD-10-CM | POA: Diagnosis present

## 2021-04-18 DIAGNOSIS — M80851A Other osteoporosis with current pathological fracture, right femur, initial encounter for fracture: Secondary | ICD-10-CM | POA: Diagnosis present

## 2021-04-18 DIAGNOSIS — Z79899 Other long term (current) drug therapy: Secondary | ICD-10-CM

## 2021-04-18 DIAGNOSIS — Z8679 Personal history of other diseases of the circulatory system: Secondary | ICD-10-CM

## 2021-04-18 DIAGNOSIS — Z79890 Hormone replacement therapy (postmenopausal): Secondary | ICD-10-CM

## 2021-04-18 DIAGNOSIS — I472 Ventricular tachycardia, unspecified: Secondary | ICD-10-CM | POA: Diagnosis present

## 2021-04-18 DIAGNOSIS — N5089 Other specified disorders of the male genital organs: Secondary | ICD-10-CM | POA: Diagnosis not present

## 2021-04-18 DIAGNOSIS — Z01810 Encounter for preprocedural cardiovascular examination: Secondary | ICD-10-CM

## 2021-04-18 DIAGNOSIS — Z953 Presence of xenogenic heart valve: Secondary | ICD-10-CM

## 2021-04-18 DIAGNOSIS — Z91199 Patient's noncompliance with other medical treatment and regimen due to unspecified reason: Secondary | ICD-10-CM

## 2021-04-18 DIAGNOSIS — M810 Age-related osteoporosis without current pathological fracture: Secondary | ICD-10-CM | POA: Diagnosis present

## 2021-04-18 DIAGNOSIS — Z85828 Personal history of other malignant neoplasm of skin: Secondary | ICD-10-CM

## 2021-04-18 DIAGNOSIS — Z66 Do not resuscitate: Secondary | ICD-10-CM | POA: Diagnosis present

## 2021-04-18 DIAGNOSIS — Z8249 Family history of ischemic heart disease and other diseases of the circulatory system: Secondary | ICD-10-CM

## 2021-04-18 DIAGNOSIS — F05 Delirium due to known physiological condition: Secondary | ICD-10-CM | POA: Diagnosis not present

## 2021-04-18 DIAGNOSIS — I959 Hypotension, unspecified: Secondary | ICD-10-CM | POA: Diagnosis present

## 2021-04-18 DIAGNOSIS — Z515 Encounter for palliative care: Secondary | ICD-10-CM | POA: Diagnosis not present

## 2021-04-18 DIAGNOSIS — S329XXA Fracture of unspecified parts of lumbosacral spine and pelvis, initial encounter for closed fracture: Secondary | ICD-10-CM

## 2021-04-18 DIAGNOSIS — T17908A Unspecified foreign body in respiratory tract, part unspecified causing other injury, initial encounter: Secondary | ICD-10-CM

## 2021-04-18 DIAGNOSIS — J690 Pneumonitis due to inhalation of food and vomit: Secondary | ICD-10-CM | POA: Diagnosis not present

## 2021-04-18 DIAGNOSIS — I493 Ventricular premature depolarization: Secondary | ICD-10-CM | POA: Diagnosis not present

## 2021-04-18 DIAGNOSIS — I252 Old myocardial infarction: Secondary | ICD-10-CM

## 2021-04-18 DIAGNOSIS — S72141A Displaced intertrochanteric fracture of right femur, initial encounter for closed fracture: Secondary | ICD-10-CM

## 2021-04-18 DIAGNOSIS — Z8673 Personal history of transient ischemic attack (TIA), and cerebral infarction without residual deficits: Secondary | ICD-10-CM

## 2021-04-18 DIAGNOSIS — R4189 Other symptoms and signs involving cognitive functions and awareness: Secondary | ICD-10-CM | POA: Diagnosis present

## 2021-04-18 HISTORY — DX: Unspecified dementia, unspecified severity, without behavioral disturbance, psychotic disturbance, mood disturbance, and anxiety: F03.90

## 2021-04-18 LAB — CBC WITH DIFFERENTIAL/PLATELET
Abs Immature Granulocytes: 0.08 10*3/uL — ABNORMAL HIGH (ref 0.00–0.07)
Basophils Absolute: 0 10*3/uL (ref 0.0–0.1)
Basophils Relative: 0 %
Eosinophils Absolute: 0 10*3/uL (ref 0.0–0.5)
Eosinophils Relative: 0 %
HCT: 37.4 % — ABNORMAL LOW (ref 39.0–52.0)
Hemoglobin: 11.9 g/dL — ABNORMAL LOW (ref 13.0–17.0)
Immature Granulocytes: 1 %
Lymphocytes Relative: 13 %
Lymphs Abs: 1.5 10*3/uL (ref 0.7–4.0)
MCH: 32.2 pg (ref 26.0–34.0)
MCHC: 31.8 g/dL (ref 30.0–36.0)
MCV: 101.4 fL — ABNORMAL HIGH (ref 80.0–100.0)
Monocytes Absolute: 0.4 10*3/uL (ref 0.1–1.0)
Monocytes Relative: 3 %
Neutro Abs: 9.3 10*3/uL — ABNORMAL HIGH (ref 1.7–7.7)
Neutrophils Relative %: 83 %
Platelets: 100 10*3/uL — ABNORMAL LOW (ref 150–400)
RBC: 3.69 MIL/uL — ABNORMAL LOW (ref 4.22–5.81)
RDW: 13.3 % (ref 11.5–15.5)
WBC: 11.3 10*3/uL — ABNORMAL HIGH (ref 4.0–10.5)
nRBC: 0 % (ref 0.0–0.2)

## 2021-04-18 LAB — RESP PANEL BY RT-PCR (FLU A&B, COVID) ARPGX2
Influenza A by PCR: NEGATIVE
Influenza B by PCR: NEGATIVE
SARS Coronavirus 2 by RT PCR: NEGATIVE

## 2021-04-18 LAB — PREPARE RBC (CROSSMATCH)

## 2021-04-18 LAB — MAGNESIUM: Magnesium: 1.7 mg/dL (ref 1.7–2.4)

## 2021-04-18 LAB — BASIC METABOLIC PANEL
Anion gap: 11 (ref 5–15)
BUN: 48 mg/dL — ABNORMAL HIGH (ref 8–23)
CO2: 21 mmol/L — ABNORMAL LOW (ref 22–32)
Calcium: 9.1 mg/dL (ref 8.9–10.3)
Chloride: 106 mmol/L (ref 98–111)
Creatinine, Ser: 1.42 mg/dL — ABNORMAL HIGH (ref 0.61–1.24)
GFR, Estimated: 47 mL/min — ABNORMAL LOW (ref >60)
Glucose, Bld: 163 mg/dL — ABNORMAL HIGH (ref 70–99)
Potassium: 4.5 mmol/L (ref 3.5–5.1)
Sodium: 138 mmol/L (ref 135–145)

## 2021-04-18 LAB — URINALYSIS, ROUTINE W REFLEX MICROSCOPIC
Bilirubin Urine: NEGATIVE
Glucose, UA: 50 mg/dL — AB
Ketones, ur: 5 mg/dL — AB
Leukocytes,Ua: NEGATIVE
Nitrite: NEGATIVE
Protein, ur: 30 mg/dL — AB
Specific Gravity, Urine: 1.044 — ABNORMAL HIGH (ref 1.005–1.030)
pH: 5 (ref 5.0–8.0)

## 2021-04-18 LAB — PROTIME-INR
INR: 1.3 — ABNORMAL HIGH (ref 0.8–1.2)
Prothrombin Time: 16.5 seconds — ABNORMAL HIGH (ref 11.4–15.2)

## 2021-04-18 LAB — TYPE AND SCREEN
ABO/RH(D): A NEG
Antibody Screen: NEGATIVE

## 2021-04-18 SURGERY — FIXATION, FEMUR, NECK, PERCUTANEOUS, USING SCREW
Anesthesia: Choice | Site: Hip | Laterality: Right

## 2021-04-18 MED ORDER — ENOXAPARIN SODIUM 30 MG/0.3ML IJ SOSY
30.0000 mg | PREFILLED_SYRINGE | INTRAMUSCULAR | Status: DC
  Administered 2021-04-21 – 2021-04-25 (×5): 30 mg via SUBCUTANEOUS
  Filled 2021-04-18 (×5): qty 0.3

## 2021-04-18 MED ORDER — ONDANSETRON HCL 4 MG/2ML IJ SOLN: 4.0000 mg | Freq: Four times a day (QID) | INTRAMUSCULAR | Status: DC | PRN

## 2021-04-18 MED ORDER — CHLORHEXIDINE GLUCONATE 4 % EX LIQD
60.0000 mL | Freq: Once | CUTANEOUS | Status: AC
  Administered 2021-04-19: 4 via TOPICAL
  Filled 2021-04-18: qty 15

## 2021-04-18 MED ORDER — MORPHINE SULFATE (PF) 2 MG/ML IV SOLN
2.0000 mg | INTRAVENOUS | Status: DC | PRN
  Administered 2021-04-19 – 2021-04-21 (×6): 2 mg via INTRAVENOUS
  Filled 2021-04-18 (×6): qty 1

## 2021-04-18 MED ORDER — POTASSIUM CHLORIDE IN NACL 20-0.9 MEQ/L-% IV SOLN
INTRAVENOUS | Status: DC
  Filled 2021-04-18: qty 1000

## 2021-04-18 MED ORDER — TRANEXAMIC ACID-NACL 1000-0.7 MG/100ML-% IV SOLN
1000.0000 mg | INTRAVENOUS | Status: AC
  Filled 2021-04-18: qty 100

## 2021-04-18 MED ORDER — MORPHINE SULFATE (PF) 4 MG/ML IV SOLN
4.0000 mg | INTRAVENOUS | Status: DC | PRN
  Administered 2021-04-18 – 2021-04-21 (×8): 4 mg via INTRAVENOUS
  Filled 2021-04-18 (×9): qty 1

## 2021-04-18 MED ORDER — LACTATED RINGERS IV BOLUS
1000.0000 mL | Freq: Once | INTRAVENOUS | Status: AC
  Administered 2021-04-18: 1000 mL via INTRAVENOUS

## 2021-04-18 MED ORDER — ONDANSETRON 4 MG PO TBDP: 4.0000 mg | ORAL_TABLET | Freq: Four times a day (QID) | ORAL | Status: DC | PRN

## 2021-04-18 MED ORDER — MORPHINE SULFATE (PF) 4 MG/ML IV SOLN
4.0000 mg | Freq: Once | INTRAVENOUS | Status: AC
  Administered 2021-04-18: 4 mg via INTRAVENOUS
  Filled 2021-04-18: qty 1

## 2021-04-18 MED ORDER — IOHEXOL 300 MG/ML  SOLN
100.0000 mL | Freq: Once | INTRAMUSCULAR | Status: AC | PRN
  Administered 2021-04-18: 100 mL via INTRAVENOUS

## 2021-04-18 MED ORDER — SODIUM CHLORIDE 0.9 % IV BOLUS
500.0000 mL | Freq: Once | INTRAVENOUS | Status: AC
  Administered 2021-04-18: 500 mL via INTRAVENOUS

## 2021-04-18 MED ORDER — SODIUM CHLORIDE 0.9 % IV SOLN
10.0000 mL/h | Freq: Once | INTRAVENOUS | Status: AC
  Administered 2021-04-18: 10 mL/h via INTRAVENOUS

## 2021-04-18 MED ORDER — METOPROLOL TARTRATE 5 MG/5ML IV SOLN
5.0000 mg | Freq: Four times a day (QID) | INTRAVENOUS | Status: DC
  Administered 2021-04-19 – 2021-04-22 (×11): 5 mg via INTRAVENOUS
  Administered 2021-04-22: 2.5 mg via INTRAVENOUS
  Administered 2021-04-22 – 2021-04-23 (×2): 5 mg via INTRAVENOUS
  Filled 2021-04-18 (×15): qty 5

## 2021-04-18 MED ORDER — PANTOPRAZOLE SODIUM 40 MG PO TBEC
40.0000 mg | DELAYED_RELEASE_TABLET | Freq: Every day | ORAL | Status: DC
  Filled 2021-04-18: qty 1

## 2021-04-18 MED ORDER — LEVOTHYROXINE SODIUM 100 MCG/5ML IV SOLN
37.5000 ug | Freq: Every day | INTRAVENOUS | Status: DC
Start: 2021-04-21 — End: 2021-04-23
  Administered 2021-04-21 – 2021-04-23 (×3): 37.5 ug via INTRAVENOUS
  Filled 2021-04-18 (×3): qty 5

## 2021-04-18 MED ORDER — CEFAZOLIN SODIUM-DEXTROSE 2-4 GM/100ML-% IV SOLN: 2.0000 g | INTRAVENOUS | Status: DC

## 2021-04-18 MED ORDER — MORPHINE SULFATE (PF) 4 MG/ML IV SOLN
4.0000 mg | INTRAVENOUS | Status: AC | PRN
  Administered 2021-04-18 (×2): 4 mg via INTRAVENOUS
  Filled 2021-04-18 (×3): qty 1

## 2021-04-18 MED ORDER — CEFAZOLIN SODIUM-DEXTROSE 2-4 GM/100ML-% IV SOLN
2.0000 g | INTRAVENOUS | Status: DC
  Filled 2021-04-18: qty 100

## 2021-04-18 MED ORDER — PANTOPRAZOLE SODIUM 40 MG IV SOLR
40.0000 mg | Freq: Every day | INTRAVENOUS | Status: DC
  Administered 2021-04-18 – 2021-04-25 (×8): 40 mg via INTRAVENOUS
  Filled 2021-04-18 (×8): qty 40

## 2021-04-18 MED ORDER — HALOPERIDOL LACTATE 5 MG/ML IJ SOLN
2.0000 mg | Freq: Once | INTRAMUSCULAR | Status: AC
  Administered 2021-04-18: 2 mg via INTRAVENOUS
  Filled 2021-04-18: qty 1

## 2021-04-18 MED ORDER — POVIDONE-IODINE 10 % EX SWAB: 2.0000 "application " | Freq: Once | CUTANEOUS | Status: DC

## 2021-04-18 NOTE — Consult Note (Signed)
Cardiology Consultation:   Patient ID: [name] MRN: [id number]; DOB: 07/30/1929  Admit date: 04/01/2021 Date of Consult: 04/13/2021  PCP:  [name], MD   [facility] Cardiologist:  Kirk Ruths, MD   Patient Profile:   [name] is a 87 y.o. male with a history of non-obstructive CAD on cardiac cath in 06/2013, severe aortic stenosis s/p bioprosthetic AVR in 08/2013, post-op atrial fibrillation, carotid stenosis s/p CEA, hypertension, hyperlipidemia who presented to the ED on 04/24/2021 after a ground level fall and was found to have a nondisplaced right femoral neck fracture and multiple pelvic fractures. Cardiology was consulted for pre-op evaluation at the request of Dr. Grandville Silos.   History of Present Illness:   [name] is a 87 year old male with the above history who is followed by Dr. Stanford Breed.  Patient was admitted with a NSTEMI in 06/2013.  Right/left cardiac catheterization showed only mild to moderate nonobstructive disease but did reveal critical aortic stenosis.  Patient ultimately underwent AVR with bovine bioprosthetic valve in 08/2013. He did have post-op atrial fibrillation but converted back to sinus rhythm with Amiodarone. No longer on Amiodarone. He also has a history of carotid stenosis s/p right CEA.  Last Echo in 06/2019 showed LVEF of 55 to 01% grade 2 diastolic dysfunction and normally functioning bioprosthetic aortic valve with no evidence of AS/AI.  Last Carotid Dopplers in 07/2019 showed mild stenosis (1-39%) of bilateral ICAs.  Patient was last seen by Dr. Stanford Breed in 07/2020 at which time he was doing well without any cardiac complaints.  Patient presented to the [facility] ED on 04/21/2021 after a fall and was found to have a nondisplaced right femoral neck fracture, right acetabular fracture, right iliac fracture, left parasymphyseal, and bilateral inferior pubic ramus fractures with associated extraperitoneal hemorrhage anterior to the bladder  and subcutaneous hematoma along the right lateral hip/flank.  Given multiple fractures, he was transferred to [facility] so the trauma team could see him.  Plan is for surgery tomorrow morning.  Cardiology has been consulted for preop evaluation.  Currently unable to obtain history from patient. RN states he has a history of dementia although I do not see this listed in his chart. He will briefly open his eyes in response to painful stimuli but does not follow commands. He is moaning in pain but unable to answer questions. I tried calling wife's number on file but did not get an answer. Therefore, history obtained via chart review.  Patient lives at home with his wife and usually ambulates with a cane.  He had an unwitnessed fall at home.  He and his wife sleep in separate rooms and this morning she heard a "thud" and found him on the floor.  He could not remember what happened earlier at [facility] ED and he is unable to tell me at this time.  He is DNR.  BP is soft in the ED.  Past Medical History:  Diagnosis Date   Aortic stenosis    a. s/p tissue AVR 08/2013;  b. Echo (09/2013):  Mild LVH, EF 55-60%, no RWMA, Gr 1 DD, AVR ok (mean gradient 9 mmHg), mild MR, mild LAE, mildly reduced RVSF, mild TR, PASP 32 mmHg.   CAD (coronary artery disease)    Cerebrovascular disease, unspecified    Glaucoma    Bilateral eyes   HLD (hyperlipidemia)    HOH (hard of hearing)    wears bilateral hearing aids   HTN (hypertension)  Pneumonia July 23, 2013   S/P aortic valve replacement with bioprosthetic valve 09/13/2013   25 mm [facility] Ease bovine bioprosthetic tissue valve   Skin cancer of face    removed from nose    Past Surgical History:  Procedure Laterality Date   AORTIC VALVE REPLACEMENT N/A 09/13/2013   Procedure: AORTIC VALVE REPLACEMENT (AVR);  Surgeon: Rexene Alberts, MD;  Location: [facility];  Service: Open Heart Surgery;  Laterality: N/A;   CARDIAC CATHETERIZATION     no PCI   CAROTID  ENDARTERECTOMY Right    CATARACT EXTRACTION Bilateral    COLONOSCOPY W/ POLYPECTOMY     EYE SURGERY     INTRAOPERATIVE TRANSESOPHAGEAL ECHOCARDIOGRAM N/A 09/13/2013   Procedure: INTRAOPERATIVE TRANSESOPHAGEAL ECHOCARDIOGRAM;  Surgeon: Rexene Alberts, MD;  Location: [place];  Service: Open Heart Surgery;  Laterality: N/A;   LEFT AND RIGHT HEART CATHETERIZATION WITH CORONARY ANGIOGRAM N/A 07/26/2013   Procedure: LEFT AND RIGHT HEART CATHETERIZATION WITH CORONARY ANGIOGRAM;  Surgeon: Sinclair Grooms, MD;  Location: [facility];  Service: Cardiovascular;  Laterality: N/A;   RETINAL DETACHMENT SURGERY       Home Medications:  Prior to Admission medications   Medication Sig Start Date End Date Taking? Authorizing Provider  aspirin 81 MG tablet Take 81 mg by mouth daily.    [provider]  cholecalciferol (VITAMIN D3) 25 MCG (1000 UNIT) tablet Take 1,000 Units by mouth daily.    [provider]  dorzolamide (TRUSOPT) 2 % ophthalmic solution Place 1 drop into the left eye 3 (three) times daily.  09/26/14   [provider]  ezetimibe (ZETIA) 10 MG tablet Take 1 tablet by mouth once daily 02/03/21   Lelon Perla, MD  levothyroxine (SYNTHROID) 75 MCG tablet Take 1 tablet (75 mcg total) by mouth daily. (Please make your OCt appt) 03/22/20   [name], MD  metoprolol tartrate (LOPRESSOR) 50 MG tablet Take 1/2 (one-half) tablet by mouth twice daily 12/18/20   Lelon Perla, MD  Multiple Vitamins-Minerals (CENTRUM SILVER PO) Take by mouth.    [provider]  Niacin CR 1000 MG TBCR Take 1 tablet by mouth once daily 10/12/19   Lelon Perla, MD  ramipril (ALTACE) 5 MG capsule Take 1 capsule by mouth once daily 02/03/21   Lelon Perla, MD  rivastigmine (EXELON) 3 MG capsule Take 1 capsule (3 mg total) by mouth 2 (two) times daily. 02/10/21   [name], MD  TIMOLOL MALEATE OP Place 1 drop into the left eye 2 (two) times daily. LEFT EYE    [provider]  Travoprost, BAK Free, (TRAVATAN) 0.004 % SOLN ophthalmic solution Place 1 drop into the left eye at bedtime.     [provider]    Inpatient Medications: Scheduled Meds:  Continuous Infusions:   ceFAZolin (ANCEF) IV     tranexamic acid     PRN Meds: morphine injection  Allergies:    Allergies  Allergen Reactions   Levaquin [Levofloxacin]     RASH    Social History:   Social History   Socioeconomic History   Marital status: Married    Spouse name: Lakeline   Number of children: 0   Years of education: 12   Highest education level: 12th grade  Occupational History   Occupation: Retired  Tobacco Use   Smoking status: Never   Smokeless tobacco: Never   Tobacco comments:    tobacco use - no  Vaping Use  Vaping Use: Never used  Substance and Sexual Activity   Alcohol use: No    Alcohol/week: 0.0 standard drinks   Drug use: No   Sexual activity: Not Currently  Other Topics Concern   Not on file  Social History Narrative   Married.    Social Determinants of Health   Financial Resource Strain: Not on file  Food Insecurity: Not on file  Transportation Needs: Not on file  Physical Activity: Not on file  Stress: Not on file  Social Connections: Not on file  Intimate Partner Violence: Not on file    Family History:   Family History  Problem Relation Age of Onset   Colon cancer Father    Heart failure Mother        CHF    Birth defects Sister      ROS:  Please see the history of present illness.  Review of Systems  Unable to perform ROS: Dementia    Physical Exam/Data:   Vitals:   03/31/2021 1315 04/14/2021 1332 03/31/2021 1400 04/04/2021 1415  BP: (!) 117/57 113/74 96/63 103/61  Pulse: (!) 119 (!) 110 (!) 117 (!) 109  Resp: 20 20 12    Temp:      TempSrc:      SpO2: 96% 95% 98% 98%  Weight:      Height:        Intake/Output Summary (Last 24 hours) at 04/09/2021 1509 Last data filed at 04/04/2021 1115 Gross per 24 hour  Intake  1315 ml  Output 500 ml  Net 815 ml   Last 3 Weights 04/19/2021 08/09/2020 07/24/2019  Weight (lbs) 132 lb 4.4 oz 137 lb 139 lb 9.6 oz  Weight (kg) 60 kg 62.143 kg 63.322 kg     Body mass index is 22.71 kg/m.  General: 87 y.o. frail Caucasian male resting comfortably in no acute distress. HEENT: Normocephalic and atraumatic. Sclera clear.  Neck: Supple. No carotid bruits. No JVD. Heart: Tachycardic with regular rhythm. Soft systolic ejection murmur.  Radial pulses 2+ and equal bilaterally. Lungs: No increased work of breathing. Clear to ausculation bilaterally. No wheezes, rhonchi, or rales.  Abdomen: Soft, non-distended, and non-tender to palpation. Bowel sounds present. MSK: Normal strength and tone for age. Extremities: No lower extremity edema.    Skin: Warm and dry. Neuro: Awake but not able to answer questions or follow commands.  Psych: Not able to answer questions or follow commands.   EKG:  The EKG was personally reviewed and demonstrates: Sinus tachycardia, rate 111 bpm, with chronic LBBB.  Telemetry:  Telemetry was personally reviewed and demonstrates:  Sinus tachycardia with rates in the 100s to 120s.   Relevant CV Studies:  Echocardiogram 07/27/2019: Impressions: 1. Left ventricular ejection fraction, by visual estimation, is 55 to  60%. The left ventricle has normal function. There is mildly increased  left ventricular hypertrophy.   2. Elevated left ventricular end-diastolic pressure.   3. Left ventricular diastolic parameters are consistent with Grade II  diastolic dysfunction (pseudonormalization).   4. The left ventricle has no regional wall motion abnormalities.   5. Global right ventricle has low normal systolic function.The right  ventricular size is mildly enlarged. No increase in right ventricular wall  thickness.   6. Left atrial size was mild-moderately dilated.   7. Right atrial size was normal.   8. The mitral valve is grossly normal. No evidence of  mitral valve  regurgitation.   9. The tricuspid valve is grossly normal.  10. The tricuspid  valve is grossly normal. Tricuspid valve regurgitation  is mild.  11. The aortic valve was not well visualized. However, bioprosthetic valve  appears to be functioning normally. Aortic valve regurgitation is not  visualized. No evidence of aortic valve sclerosis or stenosis.  12. The pulmonic valve was not well visualized. Pulmonic valve  regurgitation is not visualized.  13. Mildly elevated pulmonary artery systolic pressure.  14. The interatrial septum was not well visualized. _______________  Carotid Ultrasound 08/01/2019: Summary:  - Right Carotid: Velocities in the right ICA are consistent with a 1-39% stenosis. S/P CEA.  - Left Carotid: Velocities in the left ICA are consistent with a 1-39% stenosis.  - Vertebrals:  Bilateral vertebral arteries demonstrate antegrade flow.  - Subclavians: Normal flow hemodynamics were seen in bilateral subclavian arteries.   Laboratory Data:  High Sensitivity Troponin:  No results for input(s): TROPONINIHS in the last 720 hours.   Chemistry Recent Labs  Lab 04/17/2021 0433  NA 138  K 4.5  CL 106  CO2 21*  GLUCOSE 163*  BUN 48*  CREATININE 1.42*  CALCIUM 9.1  GFRNONAA 47*  ANIONGAP 11    No results for input(s): PROT, ALBUMIN, AST, ALT, ALKPHOS, BILITOT in the last 168 hours. Lipids No results for input(s): CHOL, TRIG, HDL, LABVLDL, LDLCALC, CHOLHDL in the last 168 hours.  Hematology Recent Labs  Lab 04/14/2021 0433  WBC 11.3*  RBC 3.69*  HGB 11.9*  HCT 37.4*  MCV 101.4*  MCH 32.2  MCHC 31.8  RDW 13.3  PLT 100*   Thyroid No results for input(s): TSH, FREET4 in the last 168 hours.  BNPNo results for input(s): BNP, PROBNP in the last 168 hours.  DDimer No results for input(s): DDIMER in the last 168 hours.   Radiology/Studies:  CT Head Wo Contrast  Result Date: 04/12/2021 CLINICAL DATA:  Mechanical fall.  Unable to get up. EXAM: CT  HEAD WITHOUT CONTRAST TECHNIQUE: Contiguous axial images were obtained from the base of the skull through the vertex without intravenous contrast. COMPARISON:  None. FINDINGS: Brain: Age related volume loss. Mild chronic small-vessel ischemic change of the white matter. No sign of acute infarction, mass lesion, hemorrhage, hydrocephalus or extra-axial collection. Vascular: There is atherosclerotic calcification of the major vessels at the base of the brain. Skull: Negative Sinuses/Orbits: Clear/normal Other: None IMPRESSION: No acute or traumatic finding. Age related volume loss and chronic small-vessel ischemic change of the white matter. Electronically Signed   By: Nelson Chimes M.D.   On: 04/19/2021 14:53   CT CHEST W CONTRAST  Result Date: 04/22/2021 CLINICAL DATA:  Abdominal trauma EXAM: CT CHEST, ABDOMEN, AND PELVIS WITH CONTRAST TECHNIQUE: Multidetector CT imaging of the chest, abdomen and pelvis was performed following the standard protocol during bolus administration of intravenous contrast. CONTRAST:  130mL OMNIPAQUE IOHEXOL 300 MG/ML  SOLN COMPARISON:  CT pelvis earlier same day, CT chest 2015 FINDINGS: CT CHEST FINDINGS Cardiovascular: Normal heart size. Aortic valve replacement. No pericardial effusion. Coronary artery calcification. Thoracic aorta is normal in caliber with calcified plaque. Mediastinum/Nodes: No mediastinal hematoma. Diaphragmatic hernia with much of the stomach, distal pancreas, and splenic flexure within the chest. No enlarged nodes. Lungs/Pleura: Chronic left basilar atelectasis adjacent to hernia. Dependent atelectasis on the right. Mild peribronchial thickening. Musculoskeletal: Chronic T7 and T8 compression fractures with marked loss of height and resulting focal kyphosis. No acute fracture. Prior median sternotomy. CT ABDOMEN PELVIS FINDINGS Hepatobiliary: No hepatic injury or perihepatic hematoma. Gallbladder is unremarkable. Pancreas: Normal main pancreatic duct.  No  apparent obstructing lesion. Spleen: Unremarkable. Adrenals/Urinary Tract: Adrenals are unremarkable. Bilateral renal cysts. Bladder is unremarkable. Stomach/Bowel: Stomach is within normal limits. Bowel is normal in caliber. Vascular/Lymphatic: Aortic atherosclerosis. No enlarged lymph nodes. Reproductive: Unremarkable. Other: Slightly increased hemorrhage adjacent to the bladder. Subcutaneous hemorrhage right hip/thigh. Musculoskeletal: Acute fractures of the pelvis as previously described. There is also a nondisplaced fracture of the right iliac bone (series 2, image 77). IMPRESSION: Slight increase in extraperitoneal hemorrhage at the level of the bladder compared to earlier CT pelvis. In addition to previously described pelvic fractures, there is also an acute nondisplaced fracture of the right iliac bone. Chronic diaphragmatic hernia with abdominal contents within the left hemithorax. Top-normal main pancreatic duct without apparent obstructing lesion. Aortic atherosclerosis. Electronically Signed   By: Macy Mis M.D.   On: 04/23/2021 08:24   CT Cervical Spine Wo Contrast  Result Date: 04/04/2021 CLINICAL DATA:  [place].  Found on the floor. EXAM: CT CERVICAL SPINE WITHOUT CONTRAST TECHNIQUE: Multidetector CT imaging of the cervical spine was performed without intravenous contrast. Multiplanar CT image reconstructions were also generated. COMPARISON:  None FINDINGS: Alignment: The study suffers from motion degradation. No traumatic malalignment. Skull base and vertebrae: No evidence of regional fracture or focal bone lesion. Soft tissues and spinal canal: No evidence of soft tissue swelling. Disc levels: Motion degradation as noted above. Facet osteoarthritis on the right at C2-3, C3-4 and C7-T1 and on the left at C3-4 and C4-5. Disc space narrowing throughout the cervical region. Vacuum phenomenon of the C6-7 level, probably degenerative. Upper chest: Not included Other: Negative/none IMPRESSION: No  acute or traumatic finding. Motion degraded examination. Chronic appearing spondylosis and facet osteoarthritis without acute traumatic finding. Electronically Signed   By: Nelson Chimes M.D.   On: 04/09/2021 14:56   CT PELVIS WO CONTRAST  Result Date: 04/07/2021 CLINICAL DATA:  Right hip pain EXAM: CT PELVIS WITHOUT CONTRAST TECHNIQUE: Multidetector CT imaging of the pelvis was performed following the standard protocol without intravenous contrast. COMPARISON:  None. FINDINGS: Urinary Tract: Bladder is within normal limits. Extraperitoneal hemorrhage anterior to the bladder in the lower pelvis (series 5/image 58). Bowel:  Visualized bowel is unremarkable. Vascular/Lymphatic: No evidence of aneurysm. Atherosclerotic calcifications. No suspicious pelvic lymphadenopathy. Reproductive:  Prostate is unremarkable. Other:  Pelvic ascites. Musculoskeletal: Nondisplaced transcervical right femoral neck fracture (coronal image 55). Mild degenerative changes of the right hip. Comminuted left parasymphyseal fracture (coronal image 43). Right anterior column acetabular fracture (series 3/image 71). Mildly displaced bilateral inferior pubic rami fractures (series 3/image 98). Subcutaneous hematoma along the right lateral hip/flank (series 5/image 102). Degenerative changes of the lumbar spine. IMPRESSION: Nondisplaced transcervical right femoral neck fracture. Right anterior column acetabular fracture. Left parasymphyseal and bilateral inferior pubic ramus fractures, as above. Associated extraperitoneal hemorrhage anterior to the bladder in the lower pelvis. Subcutaneous hematoma along the right lateral hip/flank. Electronically Signed   By: Julian Hy M.D.   On: 04/10/2021 03:53   CT ABDOMEN PELVIS W CONTRAST  Result Date: 03/31/2021 CLINICAL DATA:  Abdominal trauma EXAM: CT CHEST, ABDOMEN, AND PELVIS WITH CONTRAST TECHNIQUE: Multidetector CT imaging of the chest, abdomen and pelvis was performed following the  standard protocol during bolus administration of intravenous contrast. CONTRAST:  119mL OMNIPAQUE IOHEXOL 300 MG/ML  SOLN COMPARISON:  CT pelvis earlier same day, CT chest 2015 FINDINGS: CT CHEST FINDINGS Cardiovascular: Normal heart size. Aortic valve replacement. No pericardial effusion. Coronary artery calcification. Thoracic aorta is normal in caliber with calcified plaque. Mediastinum/Nodes:  No mediastinal hematoma. Diaphragmatic hernia with much of the stomach, distal pancreas, and splenic flexure within the chest. No enlarged nodes. Lungs/Pleura: Chronic left basilar atelectasis adjacent to hernia. Dependent atelectasis on the right. Mild peribronchial thickening. Musculoskeletal: Chronic T7 and T8 compression fractures with marked loss of height and resulting focal kyphosis. No acute fracture. Prior median sternotomy. CT ABDOMEN PELVIS FINDINGS Hepatobiliary: No hepatic injury or perihepatic hematoma. Gallbladder is unremarkable. Pancreas: Normal main pancreatic duct. No apparent obstructing lesion. Spleen: Unremarkable. Adrenals/Urinary Tract: Adrenals are unremarkable. Bilateral renal cysts. Bladder is unremarkable. Stomach/Bowel: Stomach is within normal limits. Bowel is normal in caliber. Vascular/Lymphatic: Aortic atherosclerosis. No enlarged lymph nodes. Reproductive: Unremarkable. Other: Slightly increased hemorrhage adjacent to the bladder. Subcutaneous hemorrhage right hip/thigh. Musculoskeletal: Acute fractures of the pelvis as previously described. There is also a nondisplaced fracture of the right iliac bone (series 2, image 77). IMPRESSION: Slight increase in extraperitoneal hemorrhage at the level of the bladder compared to earlier CT pelvis. In addition to previously described pelvic fractures, there is also an acute nondisplaced fracture of the right iliac bone. Chronic diaphragmatic hernia with abdominal contents within the left hemithorax. Top-normal main pancreatic duct without apparent  obstructing lesion. Aortic atherosclerosis. Electronically Signed   By: Macy Mis M.D.   On: 04/03/2021 08:24   DG Chest Port 1 View  Result Date: 04/06/2021 CLINICAL DATA:  Preop exam EXAM: PORTABLE CHEST 1 VIEW COMPARISON:  07/28/2016, CT 07/23/2013 FINDINGS: Normal cardiac silhouette with midline sternotomy. Large diaphragmatic hernia on the LEFT with stomach and bowel within the hernia sac. Hernia occupies 2/3 of the LEFT hemithorax. No pulmonary edema. No pneumothorax. Focal infiltrate. IMPRESSION: 1. Progressive large LEFT diaphragmatic hernia. 2. No acute findings. Electronically Signed   By: Suzy Bouchard M.D.   On: 04/14/2021 05:13   DG Hip Unilat W or Wo Pelvis 2-3 Views Right  Result Date: 04/28/2021 CLINICAL DATA:  Recent fall with right hip pain, initial encounter EXAM: DG HIP (WITH OR WITHOUT PELVIS) 3V RIGHT COMPARISON:  None. FINDINGS: Considerable soft tissue swelling is noted over the greater trochanter laterally. Irregularity of the superior pubic ramus on the right is noted and there is lucency identified on 1 of the frontal films. Suspicious lucency is noted in the femoral neck as well without displacement. Degenerative changes of the hip joint are seen. IMPRESSION: Findings suspicious for undisplaced superior pubic ramus fracture and right femoral neck fracture. CT of the pelvis is recommended for further evaluation to exclude occult fracture. Electronically Signed   By: Inez Catalina M.D.   On: 04/21/2021 02:24     Assessment and Plan:   Pre-Op Evaluation Patient presented after fall and was found to have a right femoral neck fracture as well as multiple pelvic fractures.  Patient has a questionable history of dementia and is currently unable to provide history.  No family at bedside so unclear how he has been doing from a cardiac standpoint.  Per revised cardiac risk index, consider low risk with a 0.9% chance of adverse cardiac events given history of CAD. However,  given advanced age and multiple fractures/trauma, risk is likely higher. There was concern for small runs of nonsustained VT on telemetry; however, I think this is mostly artifact. Echo in 06/2019 showed normal LV function with stable AVR. Discussed with MD - OK to proceed with surgery tomorrow without any additional cardiovascular work-up. OK to give blood transfusion if needed. He is euvolemic on exam.  Non-Obstructive CAD - Noted on cardiac cath  in 2015. - Aspirin on hold in anticipation of upcoming surgery. Continue beta-blocker.  Like to statins in the past so on Zetia 10 mg daily.  Severe Aortic Stenosis s/p AVR in 2015 -Last Echo in 2021 showed normally functioning bioprosthetic aortic valve with no evidence of aortic stenosis or regurgitation.  Mean gradient 6.0 mmHg.   History of Post-Op Atrial Fibrillation - History of postop atrial fibrillation following AVR. Converted on Amiodarone. Has not been on anticoagulation.  - Maintaining sinus rhythm at this time.   Carotid Stenosis - S/p right CEA. Last dopplers in 2021 showed only mild disease bilaterally.   Hypertension - BP soft. - Hold home Ramipril and Lopressor.  Hyperlipidemia - Lipid panel in 07/2020: Total Cholesterol 155, Triglycerides 85, HDL 50, LDL 89.  - Intolerant to statins. Continue Zetia 10 mg daily  Risk Assessment/Risk Scores:    For questions or updates, please contact [facility] Please consult [email or web address] for contact info under    Signed, Darreld Mclean, PA-C  04/09/2021 3:09 PM

## 2021-04-18 NOTE — H&P (View-Only) (Signed)
Reason for Consult:Pelvic and hip fxs Referring Physician: Georganna Skeans Time called: 1517 Time at bedside: [place] is an 87 y.o. male.  HPI: [name] fell at home unwitnessed. He was brought to the ED at [facility] where x-rays showed multiple pelvic fxs and a right femoral neck fx. He kept having e/o HoTN and so was transferred to [facility] for further workup and care. Orthopedic surgery was consulted upon arrival. He is demented and cannot contribute to history. He lives at home with his wife and is still ambulatory with a cane.  Past Medical History:  Diagnosis Date   Aortic stenosis    a. s/p tissue AVR 08/2013;  b. Echo (09/2013):  Mild LVH, EF 55-60%, no RWMA, Gr 1 DD, AVR ok (mean gradient 9 mmHg), mild MR, mild LAE, mildly reduced RVSF, mild TR, PASP 32 mmHg.   CAD (coronary artery disease)    Cerebrovascular disease, unspecified    Glaucoma    Bilateral eyes   HLD (hyperlipidemia)    HOH (hard of hearing)    wears bilateral hearing aids   HTN (hypertension)    Pneumonia July 23, 2013   S/P aortic valve replacement with bioprosthetic valve 09/13/2013   25 mm [facility] Ease bovine bioprosthetic tissue valve   Skin cancer of face    removed from nose    Past Surgical History:  Procedure Laterality Date   AORTIC VALVE REPLACEMENT N/A 09/13/2013   Procedure: AORTIC VALVE REPLACEMENT (AVR);  Surgeon: Rexene Alberts, MD;  Location: [place];  Service: Open Heart Surgery;  Laterality: N/A;   CARDIAC CATHETERIZATION     no PCI   CAROTID ENDARTERECTOMY Right    CATARACT EXTRACTION Bilateral    COLONOSCOPY W/ POLYPECTOMY     EYE SURGERY     INTRAOPERATIVE TRANSESOPHAGEAL ECHOCARDIOGRAM N/A 09/13/2013   Procedure: INTRAOPERATIVE TRANSESOPHAGEAL ECHOCARDIOGRAM;  Surgeon: Rexene Alberts, MD;  Location: [place];  Service: Open Heart Surgery;  Laterality: N/A;   LEFT AND RIGHT HEART CATHETERIZATION WITH CORONARY ANGIOGRAM N/A 07/26/2013   Procedure: LEFT AND RIGHT HEART CATHETERIZATION  WITH CORONARY ANGIOGRAM;  Surgeon: Sinclair Grooms, MD;  Location: [facility] CATH LAB;  Service: Cardiovascular;  Laterality: N/A;   RETINAL DETACHMENT SURGERY      Family History  Problem Relation Age of Onset   Colon cancer Father    Heart failure Mother        CHF    Birth defects Sister     Social History:  reports that he has never smoked. He has never used smokeless tobacco. He reports that he does not drink alcohol and does not use drugs.  Allergies:  Allergies  Allergen Reactions   Levaquin [Levofloxacin]     RASH    Medications: I have reviewed the patient's current medications.  Results for orders placed or performed during the hospital encounter of 04/15/2021 (from the past 48 hour(s))  Resp Panel by RT-PCR (Flu A&B, Covid) Nasopharyngeal Swab     Status: None   Collection Time: 04/17/2021  4:05 AM   Specimen: Nasopharyngeal Swab; Nasopharyngeal(NP) swabs in vial transport medium  Result Value Ref Range   SARS Coronavirus 2 by RT PCR NEGATIVE NEGATIVE    Comment: (NOTE) SARS-CoV-2 target nucleic acids are NOT DETECTED.  The SARS-CoV-2 RNA is generally detectable in upper respiratory specimens during the acute phase of infection. The lowest concentration of SARS-CoV-2 viral copies this assay can detect is 138 copies/mL. A negative result does not  preclude SARS-Cov-2 infection and should not be used as the sole basis for treatment or other patient management decisions. A negative result may occur with  improper specimen collection/handling, submission of specimen other than nasopharyngeal swab, presence of viral mutation(s) within the areas targeted by this assay, and inadequate number of viral copies(<138 copies/mL). A negative result must be combined with clinical observations, patient history, and epidemiological information. The expected result is Negative.  Fact Sheet for Patients:  EntrepreneurPulse.com.au  Fact Sheet for Healthcare Providers:   IncredibleEmployment.be  This test is no t yet approved or cleared by the [place] FDA and  has been authorized for detection and/or diagnosis of SARS-CoV-2 by FDA under an Emergency Use Authorization (EUA). This EUA will remain  in effect (meaning this test can be used) for the duration of the COVID-19 declaration under Section 564(b)(1) of the Act, 21 U.S.C.section 360bbb-3(b)(1), unless the authorization is terminated  or revoked sooner.       Influenza A by PCR NEGATIVE NEGATIVE   Influenza B by PCR NEGATIVE NEGATIVE    Comment: (NOTE) The Xpert Xpress SARS-CoV-2/FLU/RSV plus assay is intended as an aid in the diagnosis of influenza from Nasopharyngeal swab specimens and should not be used as a sole basis for treatment. Nasal washings and aspirates are unacceptable for Xpert Xpress SARS-CoV-2/FLU/RSV testing.  Fact Sheet for Patients: EntrepreneurPulse.com.au  Fact Sheet for Healthcare Providers: IncredibleEmployment.be  This test is not yet approved or cleared by the [place] FDA and has been authorized for detection and/or diagnosis of SARS-CoV-2 by FDA under an Emergency Use Authorization (EUA). This EUA will remain in effect (meaning this test can be used) for the duration of the COVID-19 declaration under Section 564(b)(1) of the Act, 21 U.S.C. section 360bbb-3(b)(1), unless the authorization is terminated or revoked.  Performed at [facility], [address], [place], [place] [address]   Basic metabolic panel     Status: Abnormal   Collection Time: 03/30/2021  4:33 AM  Result Value Ref Range   Sodium 138 135 - 145 mmol/L   Potassium 4.5 3.5 - 5.1 mmol/L   Chloride 106 98 - 111 mmol/L   CO2 21 (L) 22 - 32 mmol/L   Glucose, Bld 163 (H) 70 - 99 mg/dL    Comment: Glucose reference range applies only to samples taken after fasting for at least 8 hours.   BUN 48 (H) 8 - 23 mg/dL   Creatinine, Ser 1.42  (H) 0.61 - 1.24 mg/dL   Calcium 9.1 8.9 - 10.3 mg/dL   GFR, Estimated 47 (L) >60 mL/min    Comment: (NOTE) Calculated using the CKD-EPI Creatinine Equation (2021)    Anion gap 11 5 - 15    Comment: Performed at [facility], [address], [place], [place] [address]  CBC WITH DIFFERENTIAL     Status: Abnormal   Collection Time: 04/23/2021  4:33 AM  Result Value Ref Range   WBC 11.3 (H) 4.0 - 10.5 K/uL   RBC 3.69 (L) 4.22 - 5.81 MIL/uL   Hemoglobin 11.9 (L) 13.0 - 17.0 g/dL   HCT 37.4 (L) 39.0 - 52.0 %   MCV 101.4 (H) 80.0 - 100.0 fL   MCH 32.2 26.0 - 34.0 pg   MCHC 31.8 30.0 - 36.0 g/dL   RDW 13.3 11.5 - 15.5 %   Platelets 100 (L) 150 - 400 K/uL    Comment: SPECIMEN CHECKED FOR CLOTS Immature Platelet Fraction may be clinically indicated, consider ordering this additional test  [id number] REPEATED TO VERIFY PLATELET COUNT CONFIRMED BY SMEAR    nRBC 0.0 0.0 - 0.2 %   Neutrophils Relative % 83 %   Neutro Abs 9.3 (H) 1.7 - 7.7 K/uL   Lymphocytes Relative 13 %   Lymphs Abs 1.5 0.7 - 4.0 K/uL   Monocytes Relative 3 %   Monocytes Absolute 0.4 0.1 - 1.0 K/uL   Eosinophils Relative 0 %   Eosinophils Absolute 0.0 0.0 - 0.5 K/uL   Basophils Relative 0 %   Basophils Absolute 0.0 0.0 - 0.1 K/uL   Immature Granulocytes 1 %   Abs Immature Granulocytes 0.08 (H) 0.00 - 0.07 K/uL    Comment: Performed at [facility], [address] [place], [place] [address]  Protime-INR     Status: Abnormal   Collection Time: 04/07/2021  4:33 AM  Result Value Ref Range   Prothrombin Time 16.5 (H) 11.4 - 15.2 seconds   INR 1.3 (H) 0.8 - 1.2    Comment: (NOTE) INR goal varies based on device and disease states. Performed at [facility], [address], [place], [place] [address]   Type and screen [facility]     Status: None (Preliminary result)   Collection Time: 04/09/2021  4:33 AM  Result Value Ref Range   ABO/RH(D) A NEG    Antibody Screen NEG    Sample Expiration 04/21/2021,2359    Unit  Number [id number]    Blood Component Type RED CELLS,LR    Unit division 00    Status of Unit ISSUED    Transfusion Status OK TO TRANSFUSE    Crossmatch Result      Compatible Performed at [facility], [address], [place], [place] [address]    Unit Number [id number]    Blood Component Type RBC LR PHER1    Unit division 00    Status of Unit ALLOCATED    Transfusion Status OK TO TRANSFUSE    Crossmatch Result Compatible   Prepare RBC (crossmatch)     Status: None   Collection Time: 04/09/2021  4:33 AM  Result Value Ref Range   Order Confirmation      ORDER PROCESSED BY BLOOD BANK Performed at [facility] [address] [place], [place] [address]   Urinalysis, Routine w reflex microscopic Urine, Catheterized     Status: Abnormal   Collection Time: 04/19/2021 11:12 AM  Result Value Ref Range   Color, Urine YELLOW YELLOW   APPearance CLEAR CLEAR   Specific Gravity, Urine 1.044 (H) 1.005 - 1.030   pH 5.0 5.0 - 8.0   Glucose, UA 50 (A) NEGATIVE mg/dL   Hgb urine dipstick SMALL (A) NEGATIVE   Bilirubin Urine NEGATIVE NEGATIVE   Ketones, ur 5 (A) NEGATIVE mg/dL   Protein, ur 30 (A) NEGATIVE mg/dL   Nitrite NEGATIVE NEGATIVE   Leukocytes,Ua NEGATIVE NEGATIVE   RBC / HPF 0-5 0 - 5 RBC/hpf   WBC, UA 0-5 0 - 5 WBC/hpf   Bacteria, UA RARE (A) NONE SEEN   Squamous Epithelial / LPF 0-5 0 - 5   Mucus PRESENT     Comment: Performed at [facility], [address], [place], [place] [address]    CT CHEST W CONTRAST  Result Date: 04/26/2021 CLINICAL DATA:  Abdominal trauma EXAM: CT CHEST, ABDOMEN, AND PELVIS WITH CONTRAST TECHNIQUE: Multidetector CT imaging of the chest, abdomen and pelvis was performed following the standard protocol during bolus administration of intravenous contrast. CONTRAST:  178mL OMNIPAQUE IOHEXOL 300 MG/ML  SOLN COMPARISON:  CT pelvis earlier same day, CT chest 2015 FINDINGS: CT CHEST FINDINGS Cardiovascular: Normal heart size. Aortic valve replacement. No  pericardial effusion. Coronary artery calcification. Thoracic aorta is normal in caliber with calcified plaque. Mediastinum/Nodes: No mediastinal hematoma. Diaphragmatic hernia with much of the stomach, distal pancreas, and splenic flexure within the chest. No enlarged nodes. Lungs/Pleura: Chronic left basilar atelectasis adjacent to hernia. Dependent atelectasis on the right. Mild peribronchial thickening. Musculoskeletal: Chronic T7 and T8 compression fractures with marked loss of height and resulting focal kyphosis. No acute fracture. Prior median sternotomy. CT ABDOMEN PELVIS FINDINGS Hepatobiliary: No hepatic injury or perihepatic hematoma. Gallbladder is unremarkable. Pancreas: Normal main pancreatic duct. No apparent obstructing lesion. Spleen: Unremarkable. Adrenals/Urinary Tract: Adrenals are unremarkable. Bilateral renal cysts. Bladder is unremarkable. Stomach/Bowel: Stomach is within normal limits. Bowel is normal in caliber. Vascular/Lymphatic: Aortic atherosclerosis. No enlarged lymph nodes. Reproductive: Unremarkable. Other: Slightly increased hemorrhage adjacent to the bladder. Subcutaneous hemorrhage right hip/thigh. Musculoskeletal: Acute fractures of the pelvis as previously described. There is also a nondisplaced fracture of the right iliac bone (series 2, image 77). IMPRESSION: Slight increase in extraperitoneal hemorrhage at the level of the bladder compared to earlier CT pelvis. In addition to previously described pelvic fractures, there is also an acute nondisplaced fracture of the right iliac bone. Chronic diaphragmatic hernia with abdominal contents within the left hemithorax. Top-normal main pancreatic duct without apparent obstructing lesion. Aortic atherosclerosis. Electronically Signed   By: Macy Mis M.D.   On: 04/02/2021 08:24   CT PELVIS WO CONTRAST  Result Date: 04/08/2021 CLINICAL DATA:  Right hip pain EXAM: CT PELVIS WITHOUT CONTRAST TECHNIQUE: Multidetector CT imaging of  the pelvis was performed following the standard protocol without intravenous contrast. COMPARISON:  None. FINDINGS: Urinary Tract: Bladder is within normal limits. Extraperitoneal hemorrhage anterior to the bladder in the lower pelvis (series 5/image 58). Bowel:  Visualized bowel is unremarkable. Vascular/Lymphatic: No evidence of aneurysm. Atherosclerotic calcifications. No suspicious pelvic lymphadenopathy. Reproductive:  Prostate is unremarkable. Other:  Pelvic ascites. Musculoskeletal: Nondisplaced transcervical right femoral neck fracture (coronal image 55). Mild degenerative changes of the right hip. Comminuted left parasymphyseal fracture (coronal image 43). Right anterior column acetabular fracture (series 3/image 71). Mildly displaced bilateral inferior pubic rami fractures (series 3/image 98). Subcutaneous hematoma along the right lateral hip/flank (series 5/image 102). Degenerative changes of the lumbar spine. IMPRESSION: Nondisplaced transcervical right femoral neck fracture. Right anterior column acetabular fracture. Left parasymphyseal and bilateral inferior pubic ramus fractures, as above. Associated extraperitoneal hemorrhage anterior to the bladder in the lower pelvis. Subcutaneous hematoma along the right lateral hip/flank. Electronically Signed   By: Julian Hy M.D.   On: 04/01/2021 03:53   CT ABDOMEN PELVIS W CONTRAST  Result Date: 04/23/2021 CLINICAL DATA:  Abdominal trauma EXAM: CT CHEST, ABDOMEN, AND PELVIS WITH CONTRAST TECHNIQUE: Multidetector CT imaging of the chest, abdomen and pelvis was performed following the standard protocol during bolus administration of intravenous contrast. CONTRAST:  136mL OMNIPAQUE IOHEXOL 300 MG/ML  SOLN COMPARISON:  CT pelvis earlier same day, CT chest 2015 FINDINGS: CT CHEST FINDINGS Cardiovascular: Normal heart size. Aortic valve replacement. No pericardial effusion. Coronary artery calcification. Thoracic aorta is normal in caliber with calcified  plaque. Mediastinum/Nodes: No mediastinal hematoma. Diaphragmatic hernia with much of the stomach, distal pancreas, and splenic flexure within the chest. No enlarged nodes. Lungs/Pleura: Chronic left basilar atelectasis adjacent to hernia. Dependent atelectasis on the right. Mild peribronchial thickening. Musculoskeletal: Chronic T7 and T8 compression fractures with marked loss  of height and resulting focal kyphosis. No acute fracture. Prior median sternotomy. CT ABDOMEN PELVIS FINDINGS Hepatobiliary: No hepatic injury or perihepatic hematoma. Gallbladder is unremarkable. Pancreas: Normal main pancreatic duct. No apparent obstructing lesion. Spleen: Unremarkable. Adrenals/Urinary Tract: Adrenals are unremarkable. Bilateral renal cysts. Bladder is unremarkable. Stomach/Bowel: Stomach is within normal limits. Bowel is normal in caliber. Vascular/Lymphatic: Aortic atherosclerosis. No enlarged lymph nodes. Reproductive: Unremarkable. Other: Slightly increased hemorrhage adjacent to the bladder. Subcutaneous hemorrhage right hip/thigh. Musculoskeletal: Acute fractures of the pelvis as previously described. There is also a nondisplaced fracture of the right iliac bone (series 2, image 77). IMPRESSION: Slight increase in extraperitoneal hemorrhage at the level of the bladder compared to earlier CT pelvis. In addition to previously described pelvic fractures, there is also an acute nondisplaced fracture of the right iliac bone. Chronic diaphragmatic hernia with abdominal contents within the left hemithorax. Top-normal main pancreatic duct without apparent obstructing lesion. Aortic atherosclerosis. Electronically Signed   By: Macy Mis M.D.   On: 04/15/2021 08:24   DG Chest Port 1 View  Result Date: 04/05/2021 CLINICAL DATA:  Preop exam EXAM: PORTABLE CHEST 1 VIEW COMPARISON:  07/28/2016, CT 07/23/2013 FINDINGS: Normal cardiac silhouette with midline sternotomy. Large diaphragmatic hernia on the LEFT with stomach  and bowel within the hernia sac. Hernia occupies 2/3 of the LEFT hemithorax. No pulmonary edema. No pneumothorax. Focal infiltrate. IMPRESSION: 1. Progressive large LEFT diaphragmatic hernia. 2. No acute findings. Electronically Signed   By: Suzy Bouchard M.D.   On: 04/19/2021 05:13   DG Hip Unilat W or Wo Pelvis 2-3 Views Right  Result Date: 04/05/2021 CLINICAL DATA:  Recent fall with right hip pain, initial encounter EXAM: DG HIP (WITH OR WITHOUT PELVIS) 3V RIGHT COMPARISON:  None. FINDINGS: Considerable soft tissue swelling is noted over the greater trochanter laterally. Irregularity of the superior pubic ramus on the right is noted and there is lucency identified on 1 of the frontal films. Suspicious lucency is noted in the femoral neck as well without displacement. Degenerative changes of the hip joint are seen. IMPRESSION: Findings suspicious for undisplaced superior pubic ramus fracture and right femoral neck fracture. CT of the pelvis is recommended for further evaluation to exclude occult fracture. Electronically Signed   By: Inez Catalina M.D.   On: 04/07/2021 02:24    Review of Systems  Unable to perform ROS: Dementia  Blood pressure 113/74, pulse (!) 110, temperature 98.8 F (37.1 C), temperature source Oral, resp. rate 20, height 5\' 4"  (1.626 m), weight 60 kg, SpO2 95 %. Physical Exam Constitutional:      General: He is not in acute distress.    Appearance: He is well-developed. He is not diaphoretic.  HENT:     Head: Normocephalic and atraumatic.  Eyes:     General: No scleral icterus.       Right eye: No discharge.        Left eye: No discharge.     Conjunctiva/sclera: Conjunctivae normal.  Cardiovascular:     Rate and Rhythm: Normal rate and regular rhythm.  Pulmonary:     Effort: Pulmonary effort is normal. No respiratory distress.  Musculoskeletal:     Cervical back: Normal range of motion.     Comments: RLE No traumatic wounds, ecchymosis, or rash  Severe TTP right  hip, mod TTP right knee, mild TTP left knee  No knee or ankle effusion  Knee stable to varus/ valgus and anterior/posterior stress  Sens DPN, SPN, TN could not assess  Motor EHL, ext, flex, evers could not assess  DP 2+, PT 1+, No significant edema  Skin:    General: Skin is warm and dry.  Neurological:     Mental Status: He is alert.  Psychiatric:        Mood and Affect: Mood normal.        Behavior: Behavior normal.    Assessment/Plan: Pelvic fxs -- Non-operative, may be WBAT Right femoral neck fx -- Plan cannulated hip pinning by Dr. Percell Miller Sunday if cleared medically.     Gregory Abu, PA-C Orthopedic Surgery [phone] 04/19/2021, 1:36 PM

## 2021-04-18 NOTE — ED Notes (Signed)
Attempted to call nursing report to [phone]

## 2021-04-18 NOTE — ED Notes (Signed)
Verbal consent obtained from wife for blood to be transfused. Pt alter mental status unable to sign for self.

## 2021-04-18 NOTE — ED Provider Notes (Addendum)
[facility] EMERGENCY DEPARTMENT Provider Note   CSN: [id number] Arrival date & time: 04/12/2021  0132     History Chief Complaint  Patient presents with   [name] is a 87 y.o. male.  The history is provided by the EMS personnel and the patient. The history is limited by the condition of the patient (Patient extremely hard of hearing).  Fall  He has history of hypertension, hyperlipidemia, coronary artery disease, cerebrovascular disease and comes in by ambulance after apparently suffering a fall at home.  Patient states he does not remember what happened, but is complaining of pain in his right hip.  He denies other injury.   Past Medical History:  Diagnosis Date   Aortic stenosis    a. s/p tissue AVR 08/2013;  b. Echo (09/2013):  Mild LVH, EF 55-60%, no RWMA, Gr 1 DD, AVR ok (mean gradient 9 mmHg), mild MR, mild LAE, mildly reduced RVSF, mild TR, PASP 32 mmHg.   CAD (coronary artery disease)    Cerebrovascular disease, unspecified    Glaucoma    Bilateral eyes   HLD (hyperlipidemia)    HOH (hard of hearing)    wears bilateral hearing aids   HTN (hypertension)    Pneumonia July 23, 2013   S/P aortic valve replacement with bioprosthetic valve 09/13/2013   25 mm [facility] Ease bovine bioprosthetic tissue valve   Skin cancer of face    removed from nose    Patient Active Problem List   Diagnosis Date Noted   Sensorineural hearing loss (SNHL) of both ears 04/18/2018   Hypothyroidism 02/27/2016   S/P aortic valve replacement with bioprosthetic valve 09/13/2013   Thrombocytopenia ([place]) 08/17/2013   Pulsatile groin mass 08/17/2013   CAD (coronary artery disease) 08/17/2013   Allergic dermatitis 07/31/2013   PREMATURE VENTRICULAR CONTRACTIONS 05/13/2010   PALPITATIONS 05/07/2010   ARM NUMBNESS 01/04/2009   Hyperlipemia 01/02/2009   Essential hypertension 01/02/2009   Aortic valve disorder 01/02/2009   Cerebrovascular disease 01/02/2009   DYSPNEA  01/02/2009    Past Surgical History:  Procedure Laterality Date   AORTIC VALVE REPLACEMENT N/A 09/13/2013   Procedure: AORTIC VALVE REPLACEMENT (AVR);  Surgeon: Rexene Alberts, MD;  Location: [place];  Service: Open Heart Surgery;  Laterality: N/A;   CARDIAC CATHETERIZATION     no PCI   CAROTID ENDARTERECTOMY Right    CATARACT EXTRACTION Bilateral    COLONOSCOPY W/ POLYPECTOMY     EYE SURGERY     INTRAOPERATIVE TRANSESOPHAGEAL ECHOCARDIOGRAM N/A 09/13/2013   Procedure: INTRAOPERATIVE TRANSESOPHAGEAL ECHOCARDIOGRAM;  Surgeon: Rexene Alberts, MD;  Location: [place];  Service: Open Heart Surgery;  Laterality: N/A;   LEFT AND RIGHT HEART CATHETERIZATION WITH CORONARY ANGIOGRAM N/A 07/26/2013   Procedure: LEFT AND RIGHT HEART CATHETERIZATION WITH CORONARY ANGIOGRAM;  Surgeon: Sinclair Grooms, MD;  Location: [facility] CATH LAB;  Service: Cardiovascular;  Laterality: N/A;   RETINAL DETACHMENT SURGERY         Family History  Problem Relation Age of Onset   Colon cancer Father    Heart failure Mother        CHF    Birth defects Sister     Social History   Tobacco Use   Smoking status: Never   Smokeless tobacco: Never   Tobacco comments:    tobacco use - no  Vaping Use   Vaping Use: Never used  Substance Use Topics   Alcohol use: No    Alcohol/week:  0.0 standard drinks   Drug use: No    Home Medications Prior to Admission medications   Medication Sig Start Date End Date Taking? Authorizing Provider  aspirin 81 MG tablet Take 81 mg by mouth daily.    [provider]  cholecalciferol (VITAMIN D3) 25 MCG (1000 UNIT) tablet Take 1,000 Units by mouth daily.    [provider]  dorzolamide (TRUSOPT) 2 % ophthalmic solution Place 1 drop into the left eye 3 (three) times daily.  09/26/14   [provider]  ezetimibe (ZETIA) 10 MG tablet Take 1 tablet by mouth once daily 02/03/21   Lelon Perla, MD  levothyroxine (SYNTHROID) 75 MCG tablet Take 1 tablet (75 mcg  total) by mouth daily. (Please make your OCt appt) 03/22/20   [name], MD  metoprolol tartrate (LOPRESSOR) 50 MG tablet Take 1/2 (one-half) tablet by mouth twice daily 12/18/20   Lelon Perla, MD  Multiple Vitamins-Minerals (CENTRUM SILVER PO) Take by mouth.    [provider]  Niacin CR 1000 MG TBCR Take 1 tablet by mouth once daily 10/12/19   Lelon Perla, MD  ramipril (ALTACE) 5 MG capsule Take 1 capsule by mouth once daily 02/03/21   Lelon Perla, MD  rivastigmine (EXELON) 3 MG capsule Take 1 capsule (3 mg total) by mouth 2 (two) times daily. 02/10/21   [name], MD  TIMOLOL MALEATE OP Place 1 drop into the left eye 2 (two) times daily. LEFT EYE    [provider]  Travoprost, BAK Free, (TRAVATAN) 0.004 % SOLN ophthalmic solution Place 1 drop into the left eye at bedtime.     [provider]    Allergies    Levaquin [levofloxacin]  Review of Systems   Review of Systems  Unable to perform ROS: Other   Physical Exam Updated Vital Signs BP 139/74   Pulse 94   Temp 98 F (36.7 C) (Oral)   Resp (!) 23   Ht 5\' 4"  (1.626 m)   Wt 60 kg   SpO2 90%   BMI 22.71 kg/m   Physical Exam Vitals and nursing note reviewed.  87 year old male, resting comfortably and in no acute distress. Vital signs are significant for slightly elevated respiratory rate. Oxygen saturation is 90%, which is normal. Head is normocephalic and atraumatic. PERRLA, EOMI. Oropharynx is clear. Neck is nontender and supple without adenopathy or JVD. Back is nontender and there is no CVA tenderness. Lungs are clear without rales, wheezes, or rhonchi. Chest is nontender. Heart has regular rate and rhythm without murmur. Abdomen is soft, flat, nontender without masses or hepatosplenomegaly and peristalsis is normoactive. Extremities: Swelling is noted in the lateral aspect of the right hip with tenderness to palpation over the same area.  Right leg is not shortened or  internally or externally rotated.  There is marked pain with any movement of the right hip.  Minor skin tear noted right elbow.  Full range of motion of all other joints without pain. Skin is warm and dry without rash. Neurologic: Awake and alert, cranial nerves are intact, moves arms without difficulty, will not move legs secondary to pain.  ED Results / Procedures / Treatments   Labs (all labs ordered are listed, but only abnormal results are displayed) Labs Reviewed  BASIC METABOLIC PANEL - Abnormal; Notable for the following components:      Result Value   CO2 21 (*)    Glucose, Bld 163 (*)  BUN 48 (*)    Creatinine, Ser 1.42 (*)    GFR, Estimated 47 (*)    All other components within normal limits  CBC WITH DIFFERENTIAL/PLATELET - Abnormal; Notable for the following components:   WBC 11.3 (*)    RBC 3.69 (*)    Hemoglobin 11.9 (*)    HCT 37.4 (*)    MCV 101.4 (*)    Platelets 100 (*)    Neutro Abs 9.3 (*)    Abs Immature Granulocytes 0.08 (*)    All other components within normal limits  PROTIME-INR - Abnormal; Notable for the following components:   Prothrombin Time 16.5 (*)    INR 1.3 (*)    All other components within normal limits  RESP PANEL BY RT-PCR (FLU A&B, COVID) ARPGX2  TYPE AND SCREEN  PREPARE RBC (CROSSMATCH)    EKG EKG Interpretation  Date/Time:  Friday April 18 2021 04:26:16 EDT Ventricular Rate:  111 PR Interval:  155 QRS Duration: 127 QT Interval:  357 QTC Calculation: 486 R Axis:   -33 Text Interpretation: Sinus tachycardia Left bundle branch block Baseline wander in lead(s) III When compared with ECG of 09/15/2013, Sinus tachycardia has replaced Atrial fibrillation with rapid ventricular response Confirmed by Delora Fuel ([id number]) on 04/26/2021 4:31:09 AM  Radiology CT PELVIS WO CONTRAST  Result Date: 04/14/2021 CLINICAL DATA:  Right hip pain EXAM: CT PELVIS WITHOUT CONTRAST TECHNIQUE: Multidetector CT imaging of the pelvis was performed  following the standard protocol without intravenous contrast. COMPARISON:  None. FINDINGS: Urinary Tract: Bladder is within normal limits. Extraperitoneal hemorrhage anterior to the bladder in the lower pelvis (series 5/image 58). Bowel:  Visualized bowel is unremarkable. Vascular/Lymphatic: No evidence of aneurysm. Atherosclerotic calcifications. No suspicious pelvic lymphadenopathy. Reproductive:  Prostate is unremarkable. Other:  Pelvic ascites. Musculoskeletal: Nondisplaced transcervical right femoral neck fracture (coronal image 55). Mild degenerative changes of the right hip. Comminuted left parasymphyseal fracture (coronal image 43). Right anterior column acetabular fracture (series 3/image 71). Mildly displaced bilateral inferior pubic rami fractures (series 3/image 98). Subcutaneous hematoma along the right lateral hip/flank (series 5/image 102). Degenerative changes of the lumbar spine. IMPRESSION: Nondisplaced transcervical right femoral neck fracture. Right anterior column acetabular fracture. Left parasymphyseal and bilateral inferior pubic ramus fractures, as above. Associated extraperitoneal hemorrhage anterior to the bladder in the lower pelvis. Subcutaneous hematoma along the right lateral hip/flank. Electronically Signed   By: Julian Hy M.D.   On: 04/09/2021 03:53   DG Chest Port 1 View  Result Date: 04/03/2021 CLINICAL DATA:  Preop exam EXAM: PORTABLE CHEST 1 VIEW COMPARISON:  07/28/2016, CT 07/23/2013 FINDINGS: Normal cardiac silhouette with midline sternotomy. Large diaphragmatic hernia on the LEFT with stomach and bowel within the hernia sac. Hernia occupies 2/3 of the LEFT hemithorax. No pulmonary edema. No pneumothorax. Focal infiltrate. IMPRESSION: 1. Progressive large LEFT diaphragmatic hernia. 2. No acute findings. Electronically Signed   By: Suzy Bouchard M.D.   On: 04/13/2021 05:13   DG Hip Unilat W or Wo Pelvis 2-3 Views Right  Result Date: 04/11/2021 CLINICAL DATA:   Recent fall with right hip pain, initial encounter EXAM: DG HIP (WITH OR WITHOUT PELVIS) 3V RIGHT COMPARISON:  None. FINDINGS: Considerable soft tissue swelling is noted over the greater trochanter laterally. Irregularity of the superior pubic ramus on the right is noted and there is lucency identified on 1 of the frontal films. Suspicious lucency is noted in the femoral neck as well without displacement. Degenerative changes of the hip joint are seen. IMPRESSION:  Findings suspicious for undisplaced superior pubic ramus fracture and right femoral neck fracture. CT of the pelvis is recommended for further evaluation to exclude occult fracture. Electronically Signed   By: Inez Catalina M.D.   On: 04/12/2021 02:24    Procedures Procedures  CRITICAL CARE Performed by: Delora Fuel Total critical care time: 120 minutes Critical care time was exclusive of separately billable procedures and treating other patients. Critical care was necessary to treat or prevent imminent or life-threatening deterioration. Critical care was time spent personally by me on the following activities: development of treatment plan with patient and/or surrogate as well as nursing, discussions with consultants, evaluation of patient's response to treatment, examination of patient, obtaining history from patient or surrogate, ordering and performing treatments and interventions, ordering and review of laboratory studies, ordering and review of radiographic studies, pulse oximetry and re-evaluation of patient's condition.  Medications Ordered in ED Medications  morphine 4 MG/ML injection 4 mg (has no administration in time range)  ceFAZolin (ANCEF) IVPB 2g/100 mL premix (has no administration in time range)  tranexamic acid (CYKLOKAPRON) IVPB 1,000 mg (has no administration in time range)  morphine 4 MG/ML injection 4 mg (4 mg Intravenous Given 03/29/2021 0258)  haloperidol lactate (HALDOL) injection 2 mg (2 mg Intravenous Given  04/02/2021 0258)  lactated ringers bolus 1,000 mL (0 mLs Intravenous Stopped 04/25/2021 0516)  lactated ringers bolus 1,000 mL (0 mLs Intravenous Stopped 03/31/2021 0700)  0.9 %  sodium chloride infusion (10 mL/hr Intravenous New Bag/Given 04/28/2021 0554)  lactated ringers bolus 1,000 mL (1,000 mLs Intravenous New Bag/Given 03/30/2021 0716)  iohexol (OMNIPAQUE) 300 MG/ML solution 100 mL (100 mLs Intravenous Contrast Given 04/05/2021 0732)    ED Course  I have reviewed the triage vital signs and the nursing notes.  Pertinent labs & imaging results that were available during my care of the patient were reviewed by me and considered in my medical decision making (see chart for details).   MDM Rules/Calculators/A&P                         Fall with injury to right hip.  X-rays have been ordered.  Old records are reviewed, and he has no relevant past visits.  Hip x-ray suspicious for surgical neck fracture, CT was recommended.  CT scan was obtained which confirms linear fracture of the femoral neck, also fracture of the anterior column of the acetabulum and anterior pelvis fractures with hematoma noted.  Patient's blood pressure dropped and he was given IV fluids.  This is felt to be secondary to blood loss from pelvis and hip fractures.  Blood pressure came up and consultation was requested with orthopedics and with the hospitalist.  Labs showed slight increase in creatinine compared with baseline, and hemoglobin dropped slightly compared with 3 years ago.  Case was discussed with Dr. Amedeo Kinsman of orthopedics who has reviewed the CT scans and states that he will plan on operating today, admission to be through hospitalists.  Patient's blood pressure then dropped down again and he was given additional IV fluids and blood was ordered for transfusion.  Blood pressure did not respond to first liter of IV fluids and the second liter was given with slight improvement.  Blood transfusion still pending.  Because of unstable  blood pressure, case is not passed to the hospitalist yet.  Case is signed out to Dr. Doren Custard, oncoming physician.  If blood pressure does not stabilize, will need to get CT with  contrast to look for possible site of extravasation and consider interventional radiology consult.  Final Clinical Impression(s) / ED Diagnoses Final diagnoses:  Closed fracture of neck of right femur, initial encounter ([place])  Closed fracture of anterior column of right acetabulum, initial encounter ([place])  Closed fracture of pelvis, initial encounter ([place])  Hypotension, unspecified hypotension type  Renal insufficiency    Rx / DC Orders ED Discharge Orders     None        Delora Fuel, MD 22/58/34 0754  Please note, I had talked with the patient's wife, who states that patient does not want to be resuscitated in the event of a cardiac arrest.   Delora Fuel, MD 62/19/47 [phone]

## 2021-04-18 NOTE — ED Notes (Signed)
Trauma at bedside.

## 2021-04-18 NOTE — ED Notes (Signed)
Visitor at bedside.

## 2021-04-18 NOTE — ED Triage Notes (Signed)
Pt had a mechanical fall, EMS states he was laying in the floor unable to get up. Pt c/o right hip pain with deformity noted. Pt also has skin tear on right elbow.

## 2021-04-18 NOTE — H&P (Signed)
History and Physical  EDGARD DEBORD [birth year]  [id number].    Chief Complaint/Reason for Consult: Trauma - GLF, TX from [facility]  HPI:  87 yo male with medical history significant for HTN, hyperlipidemia, CAD, cerebrovascular disease who presented to [facility] ED via EMS after suffering a ground level fall at home.  Apparently he walks with a cane.  He and his wife sleep in separate rooms and she heard a "thud" this morning and found him on the floor.  Work up in ED significant for nondisplaced right femoral neck fracture, right acetabular fracture, right iliac fracture, left parasymphyseal and bilateral inferior pubic ramus fractures, and associated extraperitoneal hemorrhage anterior to the bladder and subcutaneous hematoma along the right lateral hip/flank.  He apparently received TXA at [facility] as well as 1 unit of pRBCs secondary to tachycardia and hypotension.  His hgb was 11.9.  CT head pending. CT C spine pending.   Due to traumatic injuries he was transferred to [facility] ED and trauma team asked to see.  Upon our entry patient is lethargic and will barely open his eyes if you yell in his ear.  He moans when you touch every part of his body.  He can not provide history or even communicate with [place] at this time.  He will not follow commands.  He did receive some morphine for pain prior to transfer.  He is a DNR.  ROS: ROS: Unable to obtain secondary to patient's mental status.  Family History  Problem Relation Age of Onset   Colon cancer Father    Heart failure Mother        CHF    Birth defects Sister     Past Medical History:  Diagnosis Date   Aortic stenosis    a. s/p tissue AVR 08/2013;  b. Echo (09/2013):  Mild LVH, EF 55-60%, no RWMA, Gr 1 DD, AVR ok (mean gradient 9 mmHg), mild MR, mild LAE, mildly reduced RVSF, mild TR, PASP 32 mmHg.   CAD (coronary artery disease)    Cerebrovascular disease, unspecified    Glaucoma    Bilateral eyes   HLD (hyperlipidemia)    HOH (hard of  hearing)    wears bilateral hearing aids   HTN (hypertension)    Pneumonia July 23, 2013   S/P aortic valve replacement with bioprosthetic valve 09/13/2013   25 mm [facility] Ease bovine bioprosthetic tissue valve   Skin cancer of face    removed from nose    Past Surgical History:  Procedure Laterality Date   AORTIC VALVE REPLACEMENT N/A 09/13/2013   Procedure: AORTIC VALVE REPLACEMENT (AVR);  Surgeon: Rexene Alberts, MD;  Location: [place];  Service: Open Heart Surgery;  Laterality: N/A;   CARDIAC CATHETERIZATION     no PCI   CAROTID ENDARTERECTOMY Right    CATARACT EXTRACTION Bilateral    COLONOSCOPY W/ POLYPECTOMY     EYE SURGERY     INTRAOPERATIVE TRANSESOPHAGEAL ECHOCARDIOGRAM N/A 09/13/2013   Procedure: INTRAOPERATIVE TRANSESOPHAGEAL ECHOCARDIOGRAM;  Surgeon: Rexene Alberts, MD;  Location: [place];  Service: Open Heart Surgery;  Laterality: N/A;   LEFT AND RIGHT HEART CATHETERIZATION WITH CORONARY ANGIOGRAM N/A 07/26/2013   Procedure: LEFT AND RIGHT HEART CATHETERIZATION WITH CORONARY ANGIOGRAM;  Surgeon: Sinclair Grooms, MD;  Location: [facility] CATH LAB;  Service: Cardiovascular;  Laterality: N/A;   RETINAL DETACHMENT SURGERY      Social History:  reports that he has never smoked. He has never  used smokeless tobacco. He reports that he does not drink alcohol and does not use drugs.  Allergies:  Allergies  Allergen Reactions   Levaquin [Levofloxacin]     RASH    (Not in a hospital admission)   Blood pressure 107/66, pulse (!) 129, temperature 98.8 F (37.1 C), temperature source Oral, resp. rate 19, height 5\' 4"  (1.626 m), weight 60 kg, SpO2 100 %. Physical Exam:  General: lethargic frail, elderly appearing white male who is laying in bed in NAD HEENT: head is normocephalic, atraumatic.  Sclera are noninjected.  PERRL.  Ears and nose without any masses or lesions.  Mouth is pink and moist.  I believe he has upper dentures, but he won't open his mouth on command and so  I'm unable to verify this. Neck: moans to palpation, but no obvious deformities noted.  Doesn't move neck. Heart: regular, rhythm, but with tachycardia and frequent small runs of Vtach with stimulation.  Normal s1,s2. No obvious murmurs, gallops, or rubs noted.  Palpable radial and pedal pulses bilaterally Lungs: CTAB, no wheezes, rhonchi, or rales noted.  Respiratory effort nonlabored Abd: soft, seems tender specifically over his lower abdomen/pelvic region but he moans in pain with palpation everywhere so difficult to assess, ND, +BS, no masses, hernias, or organomegaly MS: all 4 extremities are symmetrical with no cyanosis, clubbing, or edema, except his right posterior forearm has a small skin tear as does his right anterior knee.   Unable to assess ROM as he will not stay awake or follow commands. Skin: warm and dry with no masses, lesions, or rashes Neuro: unable secondary to lethargy Psych: unable secondary to lethargy   Results for orders placed or performed during the hospital encounter of 04/11/2021 (from the past 48 hour(s))  Resp Panel by RT-PCR (Flu A&B, Covid) Nasopharyngeal Swab     Status: None   Collection Time: 03/30/2021  4:05 AM   Specimen: Nasopharyngeal Swab; Nasopharyngeal(NP) swabs in vial transport medium  Result Value Ref Range   SARS Coronavirus 2 by RT PCR NEGATIVE NEGATIVE    Comment: (NOTE) SARS-CoV-2 target nucleic acids are NOT DETECTED.  The SARS-CoV-2 RNA is generally detectable in upper respiratory specimens during the acute phase of infection. The lowest concentration of SARS-CoV-2 viral copies this assay can detect is 138 copies/mL. A negative result does not preclude SARS-Cov-2 infection and should not be used as the sole basis for treatment or other patient management decisions. A negative result may occur with  improper specimen collection/handling, submission of specimen other than nasopharyngeal swab, presence of viral mutation(s) within the areas  targeted by this assay, and inadequate number of viral copies(<138 copies/mL). A negative result must be combined with clinical observations, patient history, and epidemiological information. The expected result is Negative.  Fact Sheet for Patients:  EntrepreneurPulse.com.au  Fact Sheet for Healthcare Providers:  IncredibleEmployment.be  This test is no t yet approved or cleared by the [place] FDA and  has been authorized for detection and/or diagnosis of SARS-CoV-2 by FDA under an Emergency Use Authorization (EUA). This EUA will remain  in effect (meaning this test can be used) for the duration of the COVID-19 declaration under Section 564(b)(1) of the Act, 21 U.S.C.section 360bbb-3(b)(1), unless the authorization is terminated  or revoked sooner.       Influenza A by PCR NEGATIVE NEGATIVE   Influenza B by PCR NEGATIVE NEGATIVE    Comment: (NOTE) The Xpert Xpress SARS-CoV-2/FLU/RSV plus assay is intended as an aid in the  diagnosis of influenza from Nasopharyngeal swab specimens and should not be used as a sole basis for treatment. Nasal washings and aspirates are unacceptable for Xpert Xpress SARS-CoV-2/FLU/RSV testing.  Fact Sheet for Patients: EntrepreneurPulse.com.au  Fact Sheet for Healthcare Providers: IncredibleEmployment.be  This test is not yet approved or cleared by the [place] FDA and has been authorized for detection and/or diagnosis of SARS-CoV-2 by FDA under an Emergency Use Authorization (EUA). This EUA will remain in effect (meaning this test can be used) for the duration of the COVID-19 declaration under Section 564(b)(1) of the Act, 21 U.S.C. section 360bbb-3(b)(1), unless the authorization is terminated or revoked.  Performed at [facility], [address] [place], [place] [address]   Basic metabolic panel     Status: Abnormal   Collection Time: 04/28/2021  4:33 AM   Result Value Ref Range   Sodium 138 135 - 145 mmol/L   Potassium 4.5 3.5 - 5.1 mmol/L   Chloride 106 98 - 111 mmol/L   CO2 21 (L) 22 - 32 mmol/L   Glucose, Bld 163 (H) 70 - 99 mg/dL    Comment: Glucose reference range applies only to samples taken after fasting for at least 8 hours.   BUN 48 (H) 8 - 23 mg/dL   Creatinine, Ser 1.42 (H) 0.61 - 1.24 mg/dL   Calcium 9.1 8.9 - 10.3 mg/dL   GFR, Estimated 47 (L) >60 mL/min    Comment: (NOTE) Calculated using the CKD-EPI Creatinine Equation (2021)    Anion gap 11 5 - 15    Comment: Performed at [facility], [address] [place], [place] [address]  CBC WITH DIFFERENTIAL     Status: Abnormal   Collection Time: 04/17/2021  4:33 AM  Result Value Ref Range   WBC 11.3 (H) 4.0 - 10.5 K/uL   RBC 3.69 (L) 4.22 - 5.81 MIL/uL   Hemoglobin 11.9 (L) 13.0 - 17.0 g/dL   HCT 37.4 (L) 39.0 - 52.0 %   MCV 101.4 (H) 80.0 - 100.0 fL   MCH 32.2 26.0 - 34.0 pg   MCHC 31.8 30.0 - 36.0 g/dL   RDW 13.3 11.5 - 15.5 %   Platelets 100 (L) 150 - 400 K/uL    Comment: SPECIMEN CHECKED FOR CLOTS Immature Platelet Fraction may be clinically indicated, consider ordering this additional test [id number] REPEATED TO VERIFY PLATELET COUNT CONFIRMED BY SMEAR    nRBC 0.0 0.0 - 0.2 %   Neutrophils Relative % 83 %   Neutro Abs 9.3 (H) 1.7 - 7.7 K/uL   Lymphocytes Relative 13 %   Lymphs Abs 1.5 0.7 - 4.0 K/uL   Monocytes Relative 3 %   Monocytes Absolute 0.4 0.1 - 1.0 K/uL   Eosinophils Relative 0 %   Eosinophils Absolute 0.0 0.0 - 0.5 K/uL   Basophils Relative 0 %   Basophils Absolute 0.0 0.0 - 0.1 K/uL   Immature Granulocytes 1 %   Abs Immature Granulocytes 0.08 (H) 0.00 - 0.07 K/uL    Comment: Performed at [facility], [address] [place], [place] [address]  Protime-INR     Status: Abnormal   Collection Time: 03/31/2021  4:33 AM  Result Value Ref Range   Prothrombin Time 16.5 (H) 11.4 - 15.2 seconds   INR 1.3 (H) 0.8 - 1.2    Comment: (NOTE) INR goal  varies based on device and disease states. Performed at [facility], [address], [place], [place] [address]   Type and screen [facility]  HOSPITAL     Status: None (Preliminary result)   Collection Time: 04/04/2021  4:33 AM  Result Value Ref Range   ABO/RH(D) A NEG    Antibody Screen NEG    Sample Expiration 04/21/2021,2359    Unit Number [id number]    Blood Component Type RED CELLS,LR    Unit division 00    Status of Unit ISSUED    Transfusion Status OK TO TRANSFUSE    Crossmatch Result      Compatible Performed at [facility] [address], [place], [place] [address]    Unit Number [id number]    Blood Component Type RBC LR PHER1    Unit division 00    Status of Unit ALLOCATED    Transfusion Status OK TO TRANSFUSE    Crossmatch Result Compatible   Prepare RBC (crossmatch)     Status: None   Collection Time: 04/07/2021  4:33 AM  Result Value Ref Range   Order Confirmation      ORDER PROCESSED BY BLOOD BANK Performed at [facility], [address], [place], [place] [address]    CT CHEST W CONTRAST  Result Date: 04/05/2021 CLINICAL DATA:  Abdominal trauma EXAM: CT CHEST, ABDOMEN, AND PELVIS WITH CONTRAST TECHNIQUE: Multidetector CT imaging of the chest, abdomen and pelvis was performed following the standard protocol during bolus administration of intravenous contrast. CONTRAST:  149mL OMNIPAQUE IOHEXOL 300 MG/ML  SOLN COMPARISON:  CT pelvis earlier same day, CT chest 2015 FINDINGS: CT CHEST FINDINGS Cardiovascular: Normal heart size. Aortic valve replacement. No pericardial effusion. Coronary artery calcification. Thoracic aorta is normal in caliber with calcified plaque. Mediastinum/Nodes: No mediastinal hematoma. Diaphragmatic hernia with much of the stomach, distal pancreas, and splenic flexure within the chest. No enlarged nodes. Lungs/Pleura: Chronic left basilar atelectasis adjacent to hernia. Dependent atelectasis on the right. Mild peribronchial thickening.  Musculoskeletal: Chronic T7 and T8 compression fractures with marked loss of height and resulting focal kyphosis. No acute fracture. Prior median sternotomy. CT ABDOMEN PELVIS FINDINGS Hepatobiliary: No hepatic injury or perihepatic hematoma. Gallbladder is unremarkable. Pancreas: Normal main pancreatic duct. No apparent obstructing lesion. Spleen: Unremarkable. Adrenals/Urinary Tract: Adrenals are unremarkable. Bilateral renal cysts. Bladder is unremarkable. Stomach/Bowel: Stomach is within normal limits. Bowel is normal in caliber. Vascular/Lymphatic: Aortic atherosclerosis. No enlarged lymph nodes. Reproductive: Unremarkable. Other: Slightly increased hemorrhage adjacent to the bladder. Subcutaneous hemorrhage right hip/thigh. Musculoskeletal: Acute fractures of the pelvis as previously described. There is also a nondisplaced fracture of the right iliac bone (series 2, image 77). IMPRESSION: Slight increase in extraperitoneal hemorrhage at the level of the bladder compared to earlier CT pelvis. In addition to previously described pelvic fractures, there is also an acute nondisplaced fracture of the right iliac bone. Chronic diaphragmatic hernia with abdominal contents within the left hemithorax. Top-normal main pancreatic duct without apparent obstructing lesion. Aortic atherosclerosis. Electronically Signed   By: Macy Mis M.D.   On: 04/12/2021 08:24   CT PELVIS WO CONTRAST  Result Date: 04/09/2021 CLINICAL DATA:  Right hip pain EXAM: CT PELVIS WITHOUT CONTRAST TECHNIQUE: Multidetector CT imaging of the pelvis was performed following the standard protocol without intravenous contrast. COMPARISON:  None. FINDINGS: Urinary Tract: Bladder is within normal limits. Extraperitoneal hemorrhage anterior to the bladder in the lower pelvis (series 5/image 58). Bowel:  Visualized bowel is unremarkable. Vascular/Lymphatic: No evidence of aneurysm. Atherosclerotic calcifications. No suspicious pelvic  lymphadenopathy. Reproductive:  Prostate is unremarkable. Other:  Pelvic ascites. Musculoskeletal: Nondisplaced transcervical right femoral neck fracture (coronal image 55). Mild  degenerative changes of the right hip. Comminuted left parasymphyseal fracture (coronal image 43). Right anterior column acetabular fracture (series 3/image 71). Mildly displaced bilateral inferior pubic rami fractures (series 3/image 98). Subcutaneous hematoma along the right lateral hip/flank (series 5/image 102). Degenerative changes of the lumbar spine. IMPRESSION: Nondisplaced transcervical right femoral neck fracture. Right anterior column acetabular fracture. Left parasymphyseal and bilateral inferior pubic ramus fractures, as above. Associated extraperitoneal hemorrhage anterior to the bladder in the lower pelvis. Subcutaneous hematoma along the right lateral hip/flank. Electronically Signed   By: Julian Hy M.D.   On: 04/07/2021 03:53   CT ABDOMEN PELVIS W CONTRAST  Result Date: 04/11/2021 CLINICAL DATA:  Abdominal trauma EXAM: CT CHEST, ABDOMEN, AND PELVIS WITH CONTRAST TECHNIQUE: Multidetector CT imaging of the chest, abdomen and pelvis was performed following the standard protocol during bolus administration of intravenous contrast. CONTRAST:  111mL OMNIPAQUE IOHEXOL 300 MG/ML  SOLN COMPARISON:  CT pelvis earlier same day, CT chest 2015 FINDINGS: CT CHEST FINDINGS Cardiovascular: Normal heart size. Aortic valve replacement. No pericardial effusion. Coronary artery calcification. Thoracic aorta is normal in caliber with calcified plaque. Mediastinum/Nodes: No mediastinal hematoma. Diaphragmatic hernia with much of the stomach, distal pancreas, and splenic flexure within the chest. No enlarged nodes. Lungs/Pleura: Chronic left basilar atelectasis adjacent to hernia. Dependent atelectasis on the right. Mild peribronchial thickening. Musculoskeletal: Chronic T7 and T8 compression fractures with marked loss of height and  resulting focal kyphosis. No acute fracture. Prior median sternotomy. CT ABDOMEN PELVIS FINDINGS Hepatobiliary: No hepatic injury or perihepatic hematoma. Gallbladder is unremarkable. Pancreas: Normal main pancreatic duct. No apparent obstructing lesion. Spleen: Unremarkable. Adrenals/Urinary Tract: Adrenals are unremarkable. Bilateral renal cysts. Bladder is unremarkable. Stomach/Bowel: Stomach is within normal limits. Bowel is normal in caliber. Vascular/Lymphatic: Aortic atherosclerosis. No enlarged lymph nodes. Reproductive: Unremarkable. Other: Slightly increased hemorrhage adjacent to the bladder. Subcutaneous hemorrhage right hip/thigh. Musculoskeletal: Acute fractures of the pelvis as previously described. There is also a nondisplaced fracture of the right iliac bone (series 2, image 77). IMPRESSION: Slight increase in extraperitoneal hemorrhage at the level of the bladder compared to earlier CT pelvis. In addition to previously described pelvic fractures, there is also an acute nondisplaced fracture of the right iliac bone. Chronic diaphragmatic hernia with abdominal contents within the left hemithorax. Top-normal main pancreatic duct without apparent obstructing lesion. Aortic atherosclerosis. Electronically Signed   By: Macy Mis M.D.   On: 04/12/2021 08:24   DG Chest Port 1 View  Result Date: 03/31/2021 CLINICAL DATA:  Preop exam EXAM: PORTABLE CHEST 1 VIEW COMPARISON:  07/28/2016, CT 07/23/2013 FINDINGS: Normal cardiac silhouette with midline sternotomy. Large diaphragmatic hernia on the LEFT with stomach and bowel within the hernia sac. Hernia occupies 2/3 of the LEFT hemithorax. No pulmonary edema. No pneumothorax. Focal infiltrate. IMPRESSION: 1. Progressive large LEFT diaphragmatic hernia. 2. No acute findings. Electronically Signed   By: Suzy Bouchard M.D.   On: 04/05/2021 05:13   DG Hip Unilat W or Wo Pelvis 2-3 Views Right  Result Date: 04/15/2021 CLINICAL DATA:  Recent fall  with right hip pain, initial encounter EXAM: DG HIP (WITH OR WITHOUT PELVIS) 3V RIGHT COMPARISON:  None. FINDINGS: Considerable soft tissue swelling is noted over the greater trochanter laterally. Irregularity of the superior pubic ramus on the right is noted and there is lucency identified on 1 of the frontal films. Suspicious lucency is noted in the femoral neck as well without displacement. Degenerative changes of the hip joint are seen. IMPRESSION: Findings suspicious for undisplaced  superior pubic ramus fracture and right femoral neck fracture. CT of the pelvis is recommended for further evaluation to exclude occult fracture. Electronically Signed   By: Inez Catalina M.D.   On: 03/31/2021 02:24      Assessment/Plan GLF Pelvic fracture, R femoral neck fracture - consult ortho, may need OR for hip fx.  Patient is confirmed DNR but wife states to operate if needed.  Cards consult pending as patient having frequent small runs of VTACH R lat flank/hip subcutaneous hematoma - monitor H/H. Received TXA an d 1 unit of PRBCs at [facility]. Frequent Vtach - cards consult HTN - hold home meds due to intermittent hypotension HLD CAD - Dr. Stanford Breed is his primary cardiologist. Will have them assess given frequent arrhythmia as well as thoughts on surgical tolerance Cerebrovascular disease Hypothyroidism - IV synthroid Frail state - will go ahead and involved palliative care medicine in this patient who has a tenuous outlook at this point given his frailty and overall poor functional status. FEN - NPO/IVFs ID - ancef on call to OR if goes to OR with ortho VTE - Lovenox BID to start likely tomorrow  Dispo: admit to trauma service, inpatient to progress floor  Henreitta Cea, [facility] Surgery 04/19/2021, 11:29 AM Please see Amion for pager number during day hours 7:00am-4:30pm

## 2021-04-18 NOTE — Consult Note (Signed)
Reason for Consult:Pelvic and hip fxs Referring Physician: Georganna Skeans Time called: 6160 Time at bedside: [name] is an 87 y.o. male.  HPI: [name] fell at home unwitnessed. He was brought to the ED at [facility] where x-rays showed multiple pelvic fxs and a right femoral neck fx. He kept having e/o HoTN and so was transferred to [facility] for further workup and care. Orthopedic surgery was consulted upon arrival. He is demented and cannot contribute to history. He lives at home with his wife and is still ambulatory with a cane.  Past Medical History:  Diagnosis Date   Aortic stenosis    a. s/p tissue AVR 08/2013;  b. Echo (09/2013):  Mild LVH, EF 55-60%, no RWMA, Gr 1 DD, AVR ok (mean gradient 9 mmHg), mild MR, mild LAE, mildly reduced RVSF, mild TR, PASP 32 mmHg.   CAD (coronary artery disease)    Cerebrovascular disease, unspecified    Glaucoma    Bilateral eyes   HLD (hyperlipidemia)    HOH (hard of hearing)    wears bilateral hearing aids   HTN (hypertension)    Pneumonia July 23, 2013   S/P aortic valve replacement with bioprosthetic valve 09/13/2013   25 mm [facility] Ease bovine bioprosthetic tissue valve   Skin cancer of face    removed from nose    Past Surgical History:  Procedure Laterality Date   AORTIC VALVE REPLACEMENT N/A 09/13/2013   Procedure: AORTIC VALVE REPLACEMENT (AVR);  Surgeon: Rexene Alberts, MD;  Location: [place];  Service: Open Heart Surgery;  Laterality: N/A;   CARDIAC CATHETERIZATION     no PCI   CAROTID ENDARTERECTOMY Right    CATARACT EXTRACTION Bilateral    COLONOSCOPY W/ POLYPECTOMY     EYE SURGERY     INTRAOPERATIVE TRANSESOPHAGEAL ECHOCARDIOGRAM N/A 09/13/2013   Procedure: INTRAOPERATIVE TRANSESOPHAGEAL ECHOCARDIOGRAM;  Surgeon: Rexene Alberts, MD;  Location: [place];  Service: Open Heart Surgery;  Laterality: N/A;   LEFT AND RIGHT HEART CATHETERIZATION WITH CORONARY ANGIOGRAM N/A 07/26/2013   Procedure: LEFT AND RIGHT HEART CATHETERIZATION  WITH CORONARY ANGIOGRAM;  Surgeon: Sinclair Grooms, MD;  Location: [facility] CATH LAB;  Service: Cardiovascular;  Laterality: N/A;   RETINAL DETACHMENT SURGERY      Family History  Problem Relation Age of Onset   Colon cancer Father    Heart failure Mother        CHF    Birth defects Sister     Social History:  reports that he has never smoked. He has never used smokeless tobacco. He reports that he does not drink alcohol and does not use drugs.  Allergies:  Allergies  Allergen Reactions   Levaquin [Levofloxacin]     RASH    Medications: I have reviewed the patient's current medications.  Results for orders placed or performed during the hospital encounter of 04/12/2021 (from the past 48 hour(s))  Resp Panel by RT-PCR (Flu A&B, Covid) Nasopharyngeal Swab     Status: None   Collection Time: 04/08/2021  4:05 AM   Specimen: Nasopharyngeal Swab; Nasopharyngeal(NP) swabs in vial transport medium  Result Value Ref Range   SARS Coronavirus 2 by RT PCR NEGATIVE NEGATIVE    Comment: (NOTE) SARS-CoV-2 target nucleic acids are NOT DETECTED.  The SARS-CoV-2 RNA is generally detectable in upper respiratory specimens during the acute phase of infection. The lowest concentration of SARS-CoV-2 viral copies this assay can detect is 138 copies/mL. A negative result does not  preclude SARS-Cov-2 infection and should not be used as the sole basis for treatment or other patient management decisions. A negative result may occur with  improper specimen collection/handling, submission of specimen other than nasopharyngeal swab, presence of viral mutation(s) within the areas targeted by this assay, and inadequate number of viral copies(<138 copies/mL). A negative result must be combined with clinical observations, patient history, and epidemiological information. The expected result is Negative.  Fact Sheet for Patients:  EntrepreneurPulse.com.au  Fact Sheet for Healthcare Providers:   IncredibleEmployment.be  This test is no t yet approved or cleared by the [place] FDA and  has been authorized for detection and/or diagnosis of SARS-CoV-2 by FDA under an Emergency Use Authorization (EUA). This EUA will remain  in effect (meaning this test can be used) for the duration of the COVID-19 declaration under Section 564(b)(1) of the Act, 21 U.S.C.section 360bbb-3(b)(1), unless the authorization is terminated  or revoked sooner.       Influenza A by PCR NEGATIVE NEGATIVE   Influenza B by PCR NEGATIVE NEGATIVE    Comment: (NOTE) The Xpert Xpress SARS-CoV-2/FLU/RSV plus assay is intended as an aid in the diagnosis of influenza from Nasopharyngeal swab specimens and should not be used as a sole basis for treatment. Nasal washings and aspirates are unacceptable for Xpert Xpress SARS-CoV-2/FLU/RSV testing.  Fact Sheet for Patients: EntrepreneurPulse.com.au  Fact Sheet for Healthcare Providers: IncredibleEmployment.be  This test is not yet approved or cleared by the [place] FDA and has been authorized for detection and/or diagnosis of SARS-CoV-2 by FDA under an Emergency Use Authorization (EUA). This EUA will remain in effect (meaning this test can be used) for the duration of the COVID-19 declaration under Section 564(b)(1) of the Act, 21 U.S.C. section 360bbb-3(b)(1), unless the authorization is terminated or revoked.  Performed at [facility], [address], [place], [place] [address]   Basic metabolic panel     Status: Abnormal   Collection Time: 04/25/2021  4:33 AM  Result Value Ref Range   Sodium 138 135 - 145 mmol/L   Potassium 4.5 3.5 - 5.1 mmol/L   Chloride 106 98 - 111 mmol/L   CO2 21 (L) 22 - 32 mmol/L   Glucose, Bld 163 (H) 70 - 99 mg/dL    Comment: Glucose reference range applies only to samples taken after fasting for at least 8 hours.   BUN 48 (H) 8 - 23 mg/dL   Creatinine, Ser 1.42  (H) 0.61 - 1.24 mg/dL   Calcium 9.1 8.9 - 10.3 mg/dL   GFR, Estimated 47 (L) >60 mL/min    Comment: (NOTE) Calculated using the CKD-EPI Creatinine Equation (2021)    Anion gap 11 5 - 15    Comment: Performed at [facility], [address] [place], [place] [address]  CBC WITH DIFFERENTIAL     Status: Abnormal   Collection Time: 04/05/2021  4:33 AM  Result Value Ref Range   WBC 11.3 (H) 4.0 - 10.5 K/uL   RBC 3.69 (L) 4.22 - 5.81 MIL/uL   Hemoglobin 11.9 (L) 13.0 - 17.0 g/dL   HCT 37.4 (L) 39.0 - 52.0 %   MCV 101.4 (H) 80.0 - 100.0 fL   MCH 32.2 26.0 - 34.0 pg   MCHC 31.8 30.0 - 36.0 g/dL   RDW 13.3 11.5 - 15.5 %   Platelets 100 (L) 150 - 400 K/uL    Comment: SPECIMEN CHECKED FOR CLOTS Immature Platelet Fraction may be clinically indicated, consider ordering this additional test  [id number] REPEATED TO VERIFY PLATELET COUNT CONFIRMED BY SMEAR    nRBC 0.0 0.0 - 0.2 %   Neutrophils Relative % 83 %   Neutro Abs 9.3 (H) 1.7 - 7.7 K/uL   Lymphocytes Relative 13 %   Lymphs Abs 1.5 0.7 - 4.0 K/uL   Monocytes Relative 3 %   Monocytes Absolute 0.4 0.1 - 1.0 K/uL   Eosinophils Relative 0 %   Eosinophils Absolute 0.0 0.0 - 0.5 K/uL   Basophils Relative 0 %   Basophils Absolute 0.0 0.0 - 0.1 K/uL   Immature Granulocytes 1 %   Abs Immature Granulocytes 0.08 (H) 0.00 - 0.07 K/uL    Comment: Performed at [facility], [address], [place], [place] [address]  Protime-INR     Status: Abnormal   Collection Time: 04/03/2021  4:33 AM  Result Value Ref Range   Prothrombin Time 16.5 (H) 11.4 - 15.2 seconds   INR 1.3 (H) 0.8 - 1.2    Comment: (NOTE) INR goal varies based on device and disease states. Performed at [facility], [address] [place], [place] [address]   Type and screen [facility]     Status: None (Preliminary result)   Collection Time: 04/17/2021  4:33 AM  Result Value Ref Range   ABO/RH(D) A NEG    Antibody Screen NEG    Sample Expiration 04/21/2021,2359    Unit  Number [id number]    Blood Component Type RED CELLS,LR    Unit division 00    Status of Unit ISSUED    Transfusion Status OK TO TRANSFUSE    Crossmatch Result      Compatible Performed at [facility], [address] [place], [place] [address]    Unit Number [id number]    Blood Component Type RBC LR PHER1    Unit division 00    Status of Unit ALLOCATED    Transfusion Status OK TO TRANSFUSE    Crossmatch Result Compatible   Prepare RBC (crossmatch)     Status: None   Collection Time: 04/28/2021  4:33 AM  Result Value Ref Range   Order Confirmation      ORDER PROCESSED BY BLOOD BANK Performed at [facility], [address], [place], [place] [address]   Urinalysis, Routine w reflex microscopic Urine, Catheterized     Status: Abnormal   Collection Time: 04/02/2021 11:12 AM  Result Value Ref Range   Color, Urine YELLOW YELLOW   APPearance CLEAR CLEAR   Specific Gravity, Urine 1.044 (H) 1.005 - 1.030   pH 5.0 5.0 - 8.0   Glucose, UA 50 (A) NEGATIVE mg/dL   Hgb urine dipstick SMALL (A) NEGATIVE   Bilirubin Urine NEGATIVE NEGATIVE   Ketones, ur 5 (A) NEGATIVE mg/dL   Protein, ur 30 (A) NEGATIVE mg/dL   Nitrite NEGATIVE NEGATIVE   Leukocytes,Ua NEGATIVE NEGATIVE   RBC / HPF 0-5 0 - 5 RBC/hpf   WBC, UA 0-5 0 - 5 WBC/hpf   Bacteria, UA RARE (A) NONE SEEN   Squamous Epithelial / LPF 0-5 0 - 5   Mucus PRESENT     Comment: Performed at [facility], [address] [place], [place] [address]    CT CHEST W CONTRAST  Result Date: 04/02/2021 CLINICAL DATA:  Abdominal trauma EXAM: CT CHEST, ABDOMEN, AND PELVIS WITH CONTRAST TECHNIQUE: Multidetector CT imaging of the chest, abdomen and pelvis was performed following the standard protocol during bolus administration of intravenous contrast. CONTRAST:  132mL OMNIPAQUE IOHEXOL 300 MG/ML  SOLN COMPARISON:  CT pelvis earlier same day, CT chest 2015 FINDINGS: CT CHEST FINDINGS Cardiovascular: Normal heart size. Aortic valve replacement. No  pericardial effusion. Coronary artery calcification. Thoracic aorta is normal in caliber with calcified plaque. Mediastinum/Nodes: No mediastinal hematoma. Diaphragmatic hernia with much of the stomach, distal pancreas, and splenic flexure within the chest. No enlarged nodes. Lungs/Pleura: Chronic left basilar atelectasis adjacent to hernia. Dependent atelectasis on the right. Mild peribronchial thickening. Musculoskeletal: Chronic T7 and T8 compression fractures with marked loss of height and resulting focal kyphosis. No acute fracture. Prior median sternotomy. CT ABDOMEN PELVIS FINDINGS Hepatobiliary: No hepatic injury or perihepatic hematoma. Gallbladder is unremarkable. Pancreas: Normal main pancreatic duct. No apparent obstructing lesion. Spleen: Unremarkable. Adrenals/Urinary Tract: Adrenals are unremarkable. Bilateral renal cysts. Bladder is unremarkable. Stomach/Bowel: Stomach is within normal limits. Bowel is normal in caliber. Vascular/Lymphatic: Aortic atherosclerosis. No enlarged lymph nodes. Reproductive: Unremarkable. Other: Slightly increased hemorrhage adjacent to the bladder. Subcutaneous hemorrhage right hip/thigh. Musculoskeletal: Acute fractures of the pelvis as previously described. There is also a nondisplaced fracture of the right iliac bone (series 2, image 77). IMPRESSION: Slight increase in extraperitoneal hemorrhage at the level of the bladder compared to earlier CT pelvis. In addition to previously described pelvic fractures, there is also an acute nondisplaced fracture of the right iliac bone. Chronic diaphragmatic hernia with abdominal contents within the left hemithorax. Top-normal main pancreatic duct without apparent obstructing lesion. Aortic atherosclerosis. Electronically Signed   By: Macy Mis M.D.   On: 04/19/2021 08:24   CT PELVIS WO CONTRAST  Result Date: 04/08/2021 CLINICAL DATA:  Right hip pain EXAM: CT PELVIS WITHOUT CONTRAST TECHNIQUE: Multidetector CT imaging of  the pelvis was performed following the standard protocol without intravenous contrast. COMPARISON:  None. FINDINGS: Urinary Tract: Bladder is within normal limits. Extraperitoneal hemorrhage anterior to the bladder in the lower pelvis (series 5/image 58). Bowel:  Visualized bowel is unremarkable. Vascular/Lymphatic: No evidence of aneurysm. Atherosclerotic calcifications. No suspicious pelvic lymphadenopathy. Reproductive:  Prostate is unremarkable. Other:  Pelvic ascites. Musculoskeletal: Nondisplaced transcervical right femoral neck fracture (coronal image 55). Mild degenerative changes of the right hip. Comminuted left parasymphyseal fracture (coronal image 43). Right anterior column acetabular fracture (series 3/image 71). Mildly displaced bilateral inferior pubic rami fractures (series 3/image 98). Subcutaneous hematoma along the right lateral hip/flank (series 5/image 102). Degenerative changes of the lumbar spine. IMPRESSION: Nondisplaced transcervical right femoral neck fracture. Right anterior column acetabular fracture. Left parasymphyseal and bilateral inferior pubic ramus fractures, as above. Associated extraperitoneal hemorrhage anterior to the bladder in the lower pelvis. Subcutaneous hematoma along the right lateral hip/flank. Electronically Signed   By: Julian Hy M.D.   On: 03/31/2021 03:53   CT ABDOMEN PELVIS W CONTRAST  Result Date: 04/06/2021 CLINICAL DATA:  Abdominal trauma EXAM: CT CHEST, ABDOMEN, AND PELVIS WITH CONTRAST TECHNIQUE: Multidetector CT imaging of the chest, abdomen and pelvis was performed following the standard protocol during bolus administration of intravenous contrast. CONTRAST:  133mL OMNIPAQUE IOHEXOL 300 MG/ML  SOLN COMPARISON:  CT pelvis earlier same day, CT chest 2015 FINDINGS: CT CHEST FINDINGS Cardiovascular: Normal heart size. Aortic valve replacement. No pericardial effusion. Coronary artery calcification. Thoracic aorta is normal in caliber with calcified  plaque. Mediastinum/Nodes: No mediastinal hematoma. Diaphragmatic hernia with much of the stomach, distal pancreas, and splenic flexure within the chest. No enlarged nodes. Lungs/Pleura: Chronic left basilar atelectasis adjacent to hernia. Dependent atelectasis on the right. Mild peribronchial thickening. Musculoskeletal: Chronic T7 and T8 compression fractures with marked loss  of height and resulting focal kyphosis. No acute fracture. Prior median sternotomy. CT ABDOMEN PELVIS FINDINGS Hepatobiliary: No hepatic injury or perihepatic hematoma. Gallbladder is unremarkable. Pancreas: Normal main pancreatic duct. No apparent obstructing lesion. Spleen: Unremarkable. Adrenals/Urinary Tract: Adrenals are unremarkable. Bilateral renal cysts. Bladder is unremarkable. Stomach/Bowel: Stomach is within normal limits. Bowel is normal in caliber. Vascular/Lymphatic: Aortic atherosclerosis. No enlarged lymph nodes. Reproductive: Unremarkable. Other: Slightly increased hemorrhage adjacent to the bladder. Subcutaneous hemorrhage right hip/thigh. Musculoskeletal: Acute fractures of the pelvis as previously described. There is also a nondisplaced fracture of the right iliac bone (series 2, image 77). IMPRESSION: Slight increase in extraperitoneal hemorrhage at the level of the bladder compared to earlier CT pelvis. In addition to previously described pelvic fractures, there is also an acute nondisplaced fracture of the right iliac bone. Chronic diaphragmatic hernia with abdominal contents within the left hemithorax. Top-normal main pancreatic duct without apparent obstructing lesion. Aortic atherosclerosis. Electronically Signed   By: Macy Mis M.D.   On: 04/12/2021 08:24   DG Chest Port 1 View  Result Date: 04/14/2021 CLINICAL DATA:  Preop exam EXAM: PORTABLE CHEST 1 VIEW COMPARISON:  07/28/2016, CT 07/23/2013 FINDINGS: Normal cardiac silhouette with midline sternotomy. Large diaphragmatic hernia on the LEFT with stomach  and bowel within the hernia sac. Hernia occupies 2/3 of the LEFT hemithorax. No pulmonary edema. No pneumothorax. Focal infiltrate. IMPRESSION: 1. Progressive large LEFT diaphragmatic hernia. 2. No acute findings. Electronically Signed   By: Suzy Bouchard M.D.   On: 03/30/2021 05:13   DG Hip Unilat W or Wo Pelvis 2-3 Views Right  Result Date: 04/19/2021 CLINICAL DATA:  Recent fall with right hip pain, initial encounter EXAM: DG HIP (WITH OR WITHOUT PELVIS) 3V RIGHT COMPARISON:  None. FINDINGS: Considerable soft tissue swelling is noted over the greater trochanter laterally. Irregularity of the superior pubic ramus on the right is noted and there is lucency identified on 1 of the frontal films. Suspicious lucency is noted in the femoral neck as well without displacement. Degenerative changes of the hip joint are seen. IMPRESSION: Findings suspicious for undisplaced superior pubic ramus fracture and right femoral neck fracture. CT of the pelvis is recommended for further evaluation to exclude occult fracture. Electronically Signed   By: Inez Catalina M.D.   On: 04/15/2021 02:24    Review of Systems  Unable to perform ROS: Dementia  Blood pressure 113/74, pulse (!) 110, temperature 98.8 F (37.1 C), temperature source Oral, resp. rate 20, height 5\' 4"  (1.626 m), weight 60 kg, SpO2 95 %. Physical Exam Constitutional:      General: He is not in acute distress.    Appearance: He is well-developed. He is not diaphoretic.  HENT:     Head: Normocephalic and atraumatic.  Eyes:     General: No scleral icterus.       Right eye: No discharge.        Left eye: No discharge.     Conjunctiva/sclera: Conjunctivae normal.  Cardiovascular:     Rate and Rhythm: Normal rate and regular rhythm.  Pulmonary:     Effort: Pulmonary effort is normal. No respiratory distress.  Musculoskeletal:     Cervical back: Normal range of motion.     Comments: RLE No traumatic wounds, ecchymosis, or rash  Severe TTP right  hip, mod TTP right knee, mild TTP left knee  No knee or ankle effusion  Knee stable to varus/ valgus and anterior/posterior stress  Sens DPN, SPN, TN could not assess  Motor EHL, ext, flex, evers could not assess  DP 2+, PT 1+, No significant edema  Skin:    General: Skin is warm and dry.  Neurological:     Mental Status: He is alert.  Psychiatric:        Mood and Affect: Mood normal.        Behavior: Behavior normal.    Assessment/Plan: Pelvic fxs -- Non-operative, may be WBAT Right femoral neck fx -- Plan cannulated hip pinning by Dr. Percell Miller Sunday if cleared medically.     Lisette Abu, PA-C Orthopedic Surgery [phone] 04/05/2021, 1:36 PM

## 2021-04-18 NOTE — ED Notes (Signed)
Pt moaning out in pain, pt pulled male catheter, brief placed under patien

## 2021-04-18 NOTE — ED Triage Notes (Signed)
Pt to ED via [facility] transfer from [facility] , to [facility] for trauma consult, pelvic and right femur fx - fell at home, Received 1 unit of blood at [facility]. hx dementia, No medications given by [facility]. Last VS: 111/60, hr 123.

## 2021-04-18 NOTE — ED Notes (Signed)
Patient transported to CT 

## 2021-04-18 NOTE — ED Notes (Signed)
Pt back from CT at this time 

## 2021-04-18 NOTE — ED Notes (Signed)
Ortho provider in room at this time.

## 2021-04-18 NOTE — ED Notes (Signed)
Report given to Thurmond Butts, Warehouse manager at [facility] ED

## 2021-04-18 NOTE — Consult Note (Signed)
ORTHOPAEDIC CONSULTATION  REQUESTING PHYSICIAN: Delora Fuel, MD  ASSESSMENT AND PLAN: 87 y.o. male with the following: Right Hip Non displaced femoral neck fracture; also sustained minimally displaced LC1 type pelvis with minimally displaced fractures of bilateral inferior pubic rami and a high superior pubic ramus fracture on the right.    This patient requires inpatient admission to the hospitalist, to include preoperative clearance and perioperative medical management  - Weight Bearing Status/Activity: NWB Right lower extremity  - Additional recommended labs/tests: Preop Labs: CBC, BMP, PT/INR, Chest XR, and EKG  -VTE Prophylaxis: Please hold prior to OR; to resume POD#1 at the discretion of the primary team  - Pain control: Recommend PO pain medications PRN; judicious use of narcotics  - Follow-up plan: F/u 10-14 days postop  -Procedures: Plan for OR once patient has been medically optimized  Plan for Right hip CRPP; pelvis fractures can be managed without surgery  Case has been posted for 04/01/2021.  Please keep patient NPO for possible surgery later today.  He will be evaluated by anesthesia prior to surgery.   Patient was hypotensive in the ED and was given 1 U PRBC.  Upon re-evaluation by the ED provider, and consideration for the extent of his pelvis fractures, the trauma surgery team in [facility] was contacted and patient will be transferred with trauma workup pending.     Chief Complaint: Right hip pain  HPI: [name] is a 87 y.o. male who presented to the ED for evaluation after sustaining a mechanical fall.  Patient does not recall the incident.  He was in his usual state of health prior to falling.  Complaining of right hip pain.  Brought to the ED by EMS.    Patient was not responding to questions at the time of my evaluation.  He briefly opened eyes when instructed.  No family at bedside.  Most of this information was gleaned from the chart.   Past Medical  History:  Diagnosis Date   Aortic stenosis    a. s/p tissue AVR 08/2013;  b. Echo (09/2013):  Mild LVH, EF 55-60%, no RWMA, Gr 1 DD, AVR ok (mean gradient 9 mmHg), mild MR, mild LAE, mildly reduced RVSF, mild TR, PASP 32 mmHg.   CAD (coronary artery disease)    Cerebrovascular disease, unspecified    Glaucoma    Bilateral eyes   HLD (hyperlipidemia)    HOH (hard of hearing)    wears bilateral hearing aids   HTN (hypertension)    Pneumonia July 23, 2013   S/P aortic valve replacement with bioprosthetic valve 09/13/2013   25 mm [facility] Ease bovine bioprosthetic tissue valve   Skin cancer of face    removed from nose   Past Surgical History:  Procedure Laterality Date   AORTIC VALVE REPLACEMENT N/A 09/13/2013   Procedure: AORTIC VALVE REPLACEMENT (AVR);  Surgeon: Rexene Alberts, MD;  Location: [place];  Service: Open Heart Surgery;  Laterality: N/A;   CARDIAC CATHETERIZATION     no PCI   CAROTID ENDARTERECTOMY Right    CATARACT EXTRACTION Bilateral    COLONOSCOPY W/ POLYPECTOMY     EYE SURGERY     INTRAOPERATIVE TRANSESOPHAGEAL ECHOCARDIOGRAM N/A 09/13/2013   Procedure: INTRAOPERATIVE TRANSESOPHAGEAL ECHOCARDIOGRAM;  Surgeon: Rexene Alberts, MD;  Location: [place];  Service: Open Heart Surgery;  Laterality: N/A;   LEFT AND RIGHT HEART CATHETERIZATION WITH CORONARY ANGIOGRAM N/A 07/26/2013   Procedure: LEFT AND RIGHT HEART CATHETERIZATION WITH CORONARY ANGIOGRAM;  Surgeon: Mallie Mussel  Carlye Grippe, MD;  Location[facility] C[facility] LAB;  Service: Cardiovascular;  Laterality: N/A;   RETINAL DETACHMENT SURGERY     Social History   Socioeconomic History   Marital status: Married    Spouse name: [place]   Number of children: 0   Years of education: 12   Highest education level: 12th grade  Occupational History   Occupation: Retired  Tobacco Use   Smoking status: Never   Smokeless tobacco: Never   Tobacco comments:    tobacco use - no  Vaping Use   Vaping Use: Never used  Substance and Sexual  Activity   Alcohol use: No    Alcohol/week: 0.0 standard drinks   Drug use: No   Sexual activity: Not Currently  Other Topics Concern   Not on file  Social History Narrative   Married.    Social Determinants of Health   Financial Resource Strain: Not on file  Food Insecurity: Not on file  Transportation Needs: Not on file  Physical Activity: Not on file  Stress: Not on file  Social Connections: Not on file   Family History  Problem Relation Age of Onset   Colon cancer Father    Heart failure Mother        CHF    Birth defects Sister    Allergies  Allergen Reactions   Levaquin [Levofloxacin]     RASH   Prior to Admission medications   Medication Sig Start Date End Date Taking? Authorizing Provider  aspirin 81 MG tablet Take 81 mg by mouth daily.    [provider]  cholecalciferol (VITAMIN D3) 25 MCG (1000 UNIT) tablet Take 1,000 Units by mouth daily.    [provider]  dorzolamide (TRUSOPT) 2 % ophthalmic solution Place 1 drop into the left eye 3 (three) times daily.  09/26/14   [provider]  ezetimibe (ZETIA) 10 MG tablet Take 1 tablet by mouth once daily 02/03/21   Lelon Perla, MD  levothyroxine (SYNTHROID) 75 MCG tablet Take 1 tablet (75 mcg total) by mouth daily. (Please make your OCt appt) 03/22/20   [name], MD  metoprolol tartrate (LOPRESSOR) 50 MG tablet Take 1/2 (one-half) tablet by mouth twice daily 12/18/20   Lelon Perla, MD  Multiple Vitamins-Minerals (CENTRUM SILVER PO) Take by mouth.    [provider]  Niacin CR 1000 MG TBCR Take 1 tablet by mouth once daily 10/12/19   Lelon Perla, MD  ramipril (ALTACE) 5 MG capsule Take 1 capsule by mouth once daily 02/03/21   Lelon Perla, MD  rivastigmine (EXELON) 3 MG capsule Take 1 capsule (3 mg total) by mouth 2 (two) times daily. 02/10/21   [name], MD  TIMOLOL MALEATE OP Place 1 drop into the left eye 2 (two) times daily. LEFT EYE    [provider]  Travoprost, BAK Free, (TRAVATAN) 0.004 % SOLN ophthalmic solution Place 1 drop into the left eye at bedtime.     [provider]   CT PELVIS WO CONTRAST  Result Date: 04/21/2021 CLINICAL DATA:  Right hip pain EXAM: CT PELVIS WITHOUT CONTRAST TECHNIQUE: Multidetector CT imaging of the pelvis was performed following the standard protocol without intravenous contrast. COMPARISON:  None. FINDINGS: Urinary Tract: Bladder is within normal limits. Extraperitoneal hemorrhage anterior to the bladder in the lower pelvis (series 5/image 58). Bowel:  Visualized bowel is unremarkable. Vascular/Lymphatic: No evidence of aneurysm. Atherosclerotic calcifications. No suspicious pelvic lymphadenopathy. Reproductive:  Prostate is unremarkable.  Other:  Pelvic ascites. Musculoskeletal: Nondisplaced transcervical right femoral neck fracture (coronal image 55). Mild degenerative changes of the right hip. Comminuted left parasymphyseal fracture (coronal image 43). Right anterior column acetabular fracture (series 3/image 71). Mildly displaced bilateral inferior pubic rami fractures (series 3/image 98). Subcutaneous hematoma along the right lateral hip/flank (series 5/image 102). Degenerative changes of the lumbar spine. IMPRESSION: Nondisplaced transcervical right femoral neck fracture. Right anterior column acetabular fracture. Left parasymphyseal and bilateral inferior pubic ramus fractures, as above. Associated extraperitoneal hemorrhage anterior to the bladder in the lower pelvis. Subcutaneous hematoma along the right lateral hip/flank. Electronically Signed   By: Julian Hy M.D.   On: 04/19/2021 03:53   DG Chest Port 1 View  Result Date: 04/26/2021 CLINICAL DATA:  Preop exam EXAM: PORTABLE CHEST 1 VIEW COMPARISON:  07/28/2016, CT 07/23/2013 FINDINGS: Normal cardiac silhouette with midline sternotomy. Large diaphragmatic hernia on the LEFT with stomach and bowel within the hernia sac. Hernia  occupies 2/3 of the LEFT hemithorax. No pulmonary edema. No pneumothorax. Focal infiltrate. IMPRESSION: 1. Progressive large LEFT diaphragmatic hernia. 2. No acute findings. Electronically Signed   By: Suzy Bouchard M.D.   On: 03/29/2021 05:13   DG Hip Unilat W or Wo Pelvis 2-3 Views Right  Result Date: 04/14/2021 CLINICAL DATA:  Recent fall with right hip pain, initial encounter EXAM: DG HIP (WITH OR WITHOUT PELVIS) 3V RIGHT COMPARISON:  None. FINDINGS: Considerable soft tissue swelling is noted over the greater trochanter laterally. Irregularity of the superior pubic ramus on the right is noted and there is lucency identified on 1 of the frontal films. Suspicious lucency is noted in the femoral neck as well without displacement. Degenerative changes of the hip joint are seen. IMPRESSION: Findings suspicious for undisplaced superior pubic ramus fracture and right femoral neck fracture. CT of the pelvis is recommended for further evaluation to exclude occult fracture. Electronically Signed   By: Inez Catalina M.D.   On: 04/13/2021 02:24    Family History Reviewed and non-contributory, no pertinent history of problems with bleeding or anesthesia    Review of Systems Unable to assess    OBJECTIVE  Vitals:Patient Vitals for the past 8 hrs:  BP Temp Temp src Pulse Resp SpO2 Height Weight  04/25/2021 0545 92/60 -- -- (!) 119 18 100 % -- --  04/08/2021 0510 103/64 -- -- (!) 120 16 100 % -- --  04/24/2021 0500 99/65 -- -- (!) 125 -- 100 % -- --  04/10/2021 0425 (!) 73/58 -- -- (!) 112 (!) 28 100 % -- --  03/31/2021 0300 (!) 120/94 -- -- (!) 110 19 98 % -- --  04/17/2021 0200 (!) 119/95 -- -- -- -- -- -- --  04/13/2021 0145 139/74 -- -- 94 -- 90 % -- --  04/23/2021 0143 139/74 98 F (36.7 C) Oral 99 (!) 23 96 % -- --  04/06/2021 0138 -- -- -- -- -- -- 5' 4"  (1.626 m) 60 kg   General:  Does not respond to questioning.  Will briefly open his eyes when directed Cardiovascular: Extremities are warm Respiratory:  No cyanosis, no use of accessory musculature Skin: No lesions in the area of chief complaint  Psychiatric: Patient is not responding to questions Lymphatic: No swelling obvious and reported other than the area involved in the exam below Extremities  RLE: Extremity held in a fixed position.  ROM deferred due to known fracture.  Loss to light touch of the foot.  Toes are WWP.  Some  spontaneous motion of his right ankle and great toe     Test Results Imaging XR and CT of the Right hip demonstrates a Non displaced femoral neck fracture.  He also has fractures of the bilateral pubic rami's.  High superior ramus or anterior acetabular fracture.    Labs cbc Recent Labs    04/12/2021 0433  WBC 11.3*  HGB 11.9*  HCT 37.4*  PLT 100*    Labs inflam No results for input(s): CRP in the last 72 hours.  Invalid input(s): ESR  Labs coag Recent Labs    04/04/2021 0433  INR 1.3*    Recent Labs    04/15/2021 0433  NA 138  K 4.5  CL 106  CO2 21*  GLUCOSE 163*  BUN 48*  CREATININE 1.42*  CALCIUM 9.1

## 2021-04-18 NOTE — ED Notes (Signed)
Blood continues to infuse. Pt will open eyes when his name is spoken. Call bell in reach will continue to monitor

## 2021-04-18 NOTE — ED Provider Notes (Signed)
Patient presents after an unwitnessed fall at home.  He has been found to have multiple pelvic fractures.  While in the ED, he had onset of hypotension.  He has received IV fluid boluses.  Currently, he has a unit of blood ordered.  He will need orthopedic consult and hospitalist admission. Physical Exam  BP (!) 82/49   Pulse (!) 115   Temp 98 F (36.7 C) (Oral)   Resp 11   Ht 5\' 4"  (1.626 m)   Wt 60 kg   SpO2 100%   BMI 22.71 kg/m   Physical Exam Vitals and nursing note reviewed.  Constitutional:      Appearance: He is well-developed. He is ill-appearing. He is not toxic-appearing.  HENT:     Head: Normocephalic and atraumatic.     Right Ear: External ear normal.     Left Ear: External ear normal.  Eyes:     Conjunctiva/sclera: Conjunctivae normal.  Cardiovascular:     Rate and Rhythm: Regular rhythm. Tachycardia present.     Heart sounds: No murmur heard. Pulmonary:     Effort: Pulmonary effort is normal. No respiratory distress.  Abdominal:     Palpations: Abdomen is soft.     Tenderness: There is no abdominal tenderness. There is no guarding.  Musculoskeletal:     Cervical back: Neck supple.  Skin:    General: Skin is warm and dry.     Coloration: Skin is pale.  Neurological:     Mental Status: He is alert.    ED Course/Procedures     Procedures  MDM  Following readjustment of blood pressure cuff, patient was normotensive.  He did, however, continued to be tachycardic.  This does appear to be a sinus tachycardia.  Given concern for hemorrhagic shock, CT scans of chest, abdomen pelvis with contrast were ordered.  Results showed slight increase in extraperitoneal hemorrhage at level of bladder, redemonstration pelvic fractures, and new finding of nondisplaced fracture of right iliac bone.  Diaphragmatic hernia with abdominal contents of the left hemithorax favored to be chronic.  Following blood transfusion, patient continued to be normotensive and tachycardic.  Case  was discussed with hospitalist who feels that he would be better served with trauma surgery service at [facility], given his complex pelvic fractures.  Trauma surgery was consulted.  They did accept the patient in transfer.  Orthopedic surgeon was informed and did agree with plan.  His wife was also updated and also agrees with plan.  She gave consent for any further blood transfusion if needed.  Patient was transferred to [facility] for further management.       Godfrey Pick, MD 04/19/21 1314

## 2021-04-19 DIAGNOSIS — Z515 Encounter for palliative care: Secondary | ICD-10-CM | POA: Diagnosis not present

## 2021-04-19 DIAGNOSIS — Z7189 Other specified counseling: Secondary | ICD-10-CM

## 2021-04-19 LAB — CBC WITH DIFFERENTIAL/PLATELET
Abs Immature Granulocytes: 0.04 10*3/uL (ref 0.00–0.07)
Abs Immature Granulocytes: 0.04 10*3/uL (ref 0.00–0.07)
Basophils Absolute: 0 10*3/uL (ref 0.0–0.1)
Basophils Absolute: 0 10*3/uL (ref 0.0–0.1)
Basophils Relative: 0 %
Basophils Relative: 0 %
Eosinophils Absolute: 0.2 10*3/uL (ref 0.0–0.5)
Eosinophils Absolute: 0.3 10*3/uL (ref 0.0–0.5)
Eosinophils Relative: 2 %
Eosinophils Relative: 3 %
HCT: 28 % — ABNORMAL LOW (ref 39.0–52.0)
HCT: 27.9 % — ABNORMAL LOW (ref 39.0–52.0)
Hemoglobin: 9 g/dL — ABNORMAL LOW (ref 13.0–17.0)
Hemoglobin: 9.1 g/dL — ABNORMAL LOW (ref 13.0–17.0)
Immature Granulocytes: 0 %
Immature Granulocytes: 0 %
Lymphocytes Relative: 8 %
Lymphocytes Relative: 11 %
Lymphs Abs: 0.8 10*3/uL (ref 0.7–4.0)
Lymphs Abs: 1.1 10*3/uL (ref 0.7–4.0)
MCH: 32 pg (ref 26.0–34.0)
MCH: 32.3 pg (ref 26.0–34.0)
MCHC: 32.1 g/dL (ref 30.0–36.0)
MCHC: 32.6 g/dL (ref 30.0–36.0)
MCV: 99.6 fL (ref 80.0–100.0)
MCV: 98.9 fL (ref 80.0–100.0)
Monocytes Absolute: 0.4 10*3/uL (ref 0.1–1.0)
Monocytes Absolute: 0.4 10*3/uL (ref 0.1–1.0)
Monocytes Relative: 5 %
Monocytes Relative: 4 %
Neutro Abs: 8.3 10*3/uL — ABNORMAL HIGH (ref 1.7–7.7)
Neutro Abs: 7.8 10*3/uL — ABNORMAL HIGH (ref 1.7–7.7)
Neutrophils Relative %: 85 %
Neutrophils Relative %: 82 %
Platelets: 62 10*3/uL — ABNORMAL LOW (ref 150–400)
Platelets: 61 10*3/uL — ABNORMAL LOW (ref 150–400)
RBC: 2.81 MIL/uL — ABNORMAL LOW (ref 4.22–5.81)
RBC: 2.82 MIL/uL — ABNORMAL LOW (ref 4.22–5.81)
RDW: 14.1 % (ref 11.5–15.5)
RDW: 14.3 % (ref 11.5–15.5)
WBC: 9.8 10*3/uL (ref 4.0–10.5)
WBC: 9.6 10*3/uL (ref 4.0–10.5)
nRBC: 0 % (ref 0.0–0.2)
nRBC: 0 % (ref 0.0–0.2)

## 2021-04-19 LAB — COMPREHENSIVE METABOLIC PANEL
ALT: 23 U/L (ref 0–44)
ALT: 23 U/L (ref 0–44)
AST: 42 U/L — ABNORMAL HIGH (ref 15–41)
AST: 42 U/L — ABNORMAL HIGH (ref 15–41)
Albumin: 2.8 g/dL — ABNORMAL LOW (ref 3.5–5.0)
Albumin: 2.8 g/dL — ABNORMAL LOW (ref 3.5–5.0)
Alkaline Phosphatase: 26 U/L — ABNORMAL LOW (ref 38–126)
Alkaline Phosphatase: 25 U/L — ABNORMAL LOW (ref 38–126)
Anion gap: 4 — ABNORMAL LOW (ref 5–15)
Anion gap: 5 (ref 5–15)
BUN: 45 mg/dL — ABNORMAL HIGH (ref 8–23)
BUN: 42 mg/dL — ABNORMAL HIGH (ref 8–23)
CO2: 25 mmol/L (ref 22–32)
CO2: 26 mmol/L (ref 22–32)
Calcium: 8.4 mg/dL — ABNORMAL LOW (ref 8.9–10.3)
Calcium: 8.5 mg/dL — ABNORMAL LOW (ref 8.9–10.3)
Chloride: 110 mmol/L (ref 98–111)
Chloride: 109 mmol/L (ref 98–111)
Creatinine, Ser: 1.44 mg/dL — ABNORMAL HIGH (ref 0.61–1.24)
Creatinine, Ser: 1.45 mg/dL — ABNORMAL HIGH (ref 0.61–1.24)
GFR, Estimated: 46 mL/min — ABNORMAL LOW (ref >60)
GFR, Estimated: 46 mL/min — ABNORMAL LOW (ref >60)
Glucose, Bld: 128 mg/dL — ABNORMAL HIGH (ref 70–99)
Glucose, Bld: 127 mg/dL — ABNORMAL HIGH (ref 70–99)
Potassium: 5 mmol/L (ref 3.5–5.1)
Potassium: 5 mmol/L (ref 3.5–5.1)
Sodium: 139 mmol/L (ref 135–145)
Sodium: 140 mmol/L (ref 135–145)
Total Bilirubin: 1.1 mg/dL (ref 0.3–1.2)
Total Bilirubin: 1.1 mg/dL (ref 0.3–1.2)
Total Protein: 4.5 g/dL — ABNORMAL LOW (ref 6.5–8.1)
Total Protein: 4.7 g/dL — ABNORMAL LOW (ref 6.5–8.1)

## 2021-04-19 LAB — BASIC METABOLIC PANEL
Anion gap: 4 — ABNORMAL LOW (ref 5–15)
BUN: 44 mg/dL — ABNORMAL HIGH (ref 8–23)
CO2: 25 mmol/L (ref 22–32)
Calcium: 8.6 mg/dL — ABNORMAL LOW (ref 8.9–10.3)
Chloride: 108 mmol/L (ref 98–111)
Creatinine, Ser: 1.58 mg/dL — ABNORMAL HIGH (ref 0.61–1.24)
GFR, Estimated: 42 mL/min — ABNORMAL LOW (ref >60)
Glucose, Bld: 151 mg/dL — ABNORMAL HIGH (ref 70–99)
Potassium: 5.3 mmol/L — ABNORMAL HIGH (ref 3.5–5.1)
Sodium: 137 mmol/L (ref 135–145)

## 2021-04-19 LAB — CBC
HCT: 31 % — ABNORMAL LOW (ref 39.0–52.0)
Hemoglobin: 10.1 g/dL — ABNORMAL LOW (ref 13.0–17.0)
MCH: 32.3 pg (ref 26.0–34.0)
MCHC: 32.6 g/dL (ref 30.0–36.0)
MCV: 99 fL (ref 80.0–100.0)
Platelets: 69 10*3/uL — ABNORMAL LOW (ref 150–400)
RBC: 3.13 MIL/uL — ABNORMAL LOW (ref 4.22–5.81)
RDW: 14.3 % (ref 11.5–15.5)
WBC: 12.8 10*3/uL — ABNORMAL HIGH (ref 4.0–10.5)
nRBC: 0 % (ref 0.0–0.2)

## 2021-04-19 MED ORDER — CHLORHEXIDINE GLUCONATE CLOTH 2 % EX PADS
6.0000 | MEDICATED_PAD | Freq: Every day | CUTANEOUS | Status: DC
  Administered 2021-04-19 – 2021-04-24 (×6): 6 via TOPICAL

## 2021-04-19 MED ORDER — SODIUM CHLORIDE 0.9 % IV SOLN: INTRAVENOUS | Status: DC

## 2021-04-19 NOTE — Progress Notes (Signed)
Subjective/Chief Complaint: Arousable but not answering questions this am   Objective: Vital signs in last 24 hours: Temp:  [98.4 F (36.9 C)-98.8 F (37.1 C)] 98.4 F (36.9 C) (10/22 0800) Pulse Rate:  [95-130] 96 (10/22 0800) Resp:  [11-21] 20 (10/22 0800) BP: (81-130)/(47-93) 93/52 (10/22 0800) SpO2:  [88 %-100 %] 96 % (10/22 0800)    Intake/Output from previous day: 10/21 0701 - 10/22 0700 In: 1733.5 [I.V.:418.5; Blood:315; IV Piggyback:1000] Out: 800 [Urine:800] Intake/Output this shift: No intake/output data recorded.  General appearance: cachectic and slowed mentation Resp: clear to auscultation bilaterally Cardio: regular rate and rhythm GI: soft, nontender  Lab Results:  Recent Labs    04/12/2021 0433 04/19/21 0332  WBC 11.3* 12.8*  HGB 11.9* 10.1*  HCT 37.4* 31.0*  PLT 100* 69*   BMET Recent Labs    04/05/2021 0433 04/19/21 0332  NA 138 137  K 4.5 5.3*  CL 106 108  CO2 21* 25  GLUCOSE 163* 151*  BUN 48* 44*  CREATININE 1.42* 1.58*  CALCIUM 9.1 8.6*   PT/INR Recent Labs    04/13/2021 0433  LABPROT 16.5*  INR 1.3*   ABG No results for input(s): PHART, HCO3 in the last 72 hours.  Invalid input(s): PCO2, PO2  Studies/Results: CT Head Wo Contrast  Result Date: 04/03/2021 CLINICAL DATA:  Mechanical fall.  Unable to get up. EXAM: CT HEAD WITHOUT CONTRAST TECHNIQUE: Contiguous axial images were obtained from the base of the skull through the vertex without intravenous contrast. COMPARISON:  None. FINDINGS: Brain: Age related volume loss. Mild chronic small-vessel ischemic change of the white matter. No sign of acute infarction, mass lesion, hemorrhage, hydrocephalus or extra-axial collection. Vascular: There is atherosclerotic calcification of the major vessels at the base of the brain. Skull: Negative Sinuses/Orbits: Clear/normal Other: None IMPRESSION: No acute or traumatic finding. Age related volume loss and chronic small-vessel ischemic  change of the white matter. Electronically Signed   By: Nelson Chimes M.D.   On: 04/15/2021 14:53   CT CHEST W CONTRAST  Result Date: 04/17/2021 CLINICAL DATA:  Abdominal trauma EXAM: CT CHEST, ABDOMEN, AND PELVIS WITH CONTRAST TECHNIQUE: Multidetector CT imaging of the chest, abdomen and pelvis was performed following the standard protocol during bolus administration of intravenous contrast. CONTRAST:  175mL OMNIPAQUE IOHEXOL 300 MG/ML  SOLN COMPARISON:  CT pelvis earlier same day, CT chest 2015 FINDINGS: CT CHEST FINDINGS Cardiovascular: Normal heart size. Aortic valve replacement. No pericardial effusion. Coronary artery calcification. Thoracic aorta is normal in caliber with calcified plaque. Mediastinum/Nodes: No mediastinal hematoma. Diaphragmatic hernia with much of the stomach, distal pancreas, and splenic flexure within the chest. No enlarged nodes. Lungs/Pleura: Chronic left basilar atelectasis adjacent to hernia. Dependent atelectasis on the right. Mild peribronchial thickening. Musculoskeletal: Chronic T7 and T8 compression fractures with marked loss of height and resulting focal kyphosis. No acute fracture. Prior median sternotomy. CT ABDOMEN PELVIS FINDINGS Hepatobiliary: No hepatic injury or perihepatic hematoma. Gallbladder is unremarkable. Pancreas: Normal main pancreatic duct. No apparent obstructing lesion. Spleen: Unremarkable. Adrenals/Urinary Tract: Adrenals are unremarkable. Bilateral renal cysts. Bladder is unremarkable. Stomach/Bowel: Stomach is within normal limits. Bowel is normal in caliber. Vascular/Lymphatic: Aortic atherosclerosis. No enlarged lymph nodes. Reproductive: Unremarkable. Other: Slightly increased hemorrhage adjacent to the bladder. Subcutaneous hemorrhage right hip/thigh. Musculoskeletal: Acute fractures of the pelvis as previously described. There is also a nondisplaced fracture of the right iliac bone (series 2, image 77). IMPRESSION: Slight increase in  extraperitoneal hemorrhage at the level of the bladder  compared to earlier CT pelvis. In addition to previously described pelvic fractures, there is also an acute nondisplaced fracture of the right iliac bone. Chronic diaphragmatic hernia with abdominal contents within the left hemithorax. Top-normal main pancreatic duct without apparent obstructing lesion. Aortic atherosclerosis. Electronically Signed   By: Macy Mis M.D.   On: 04/14/2021 08:24   CT Cervical Spine Wo Contrast  Result Date: 04/13/2021 CLINICAL DATA:  [place].  Found on the floor. EXAM: CT CERVICAL SPINE WITHOUT CONTRAST TECHNIQUE: Multidetector CT imaging of the cervical spine was performed without intravenous contrast. Multiplanar CT image reconstructions were also generated. COMPARISON:  None FINDINGS: Alignment: The study suffers from motion degradation. No traumatic malalignment. Skull base and vertebrae: No evidence of regional fracture or focal bone lesion. Soft tissues and spinal canal: No evidence of soft tissue swelling. Disc levels: Motion degradation as noted above. Facet osteoarthritis on the right at C2-3, C3-4 and C7-T1 and on the left at C3-4 and C4-5. Disc space narrowing throughout the cervical region. Vacuum phenomenon of the C6-7 level, probably degenerative. Upper chest: Not included Other: Negative/none IMPRESSION: No acute or traumatic finding. Motion degraded examination. Chronic appearing spondylosis and facet osteoarthritis without acute traumatic finding. Electronically Signed   By: Nelson Chimes M.D.   On: 04/23/2021 14:56   CT PELVIS WO CONTRAST  Result Date: 04/08/2021 CLINICAL DATA:  Right hip pain EXAM: CT PELVIS WITHOUT CONTRAST TECHNIQUE: Multidetector CT imaging of the pelvis was performed following the standard protocol without intravenous contrast. COMPARISON:  None. FINDINGS: Urinary Tract: Bladder is within normal limits. Extraperitoneal hemorrhage anterior to the bladder in the lower pelvis (series  5/image 58). Bowel:  Visualized bowel is unremarkable. Vascular/Lymphatic: No evidence of aneurysm. Atherosclerotic calcifications. No suspicious pelvic lymphadenopathy. Reproductive:  Prostate is unremarkable. Other:  Pelvic ascites. Musculoskeletal: Nondisplaced transcervical right femoral neck fracture (coronal image 55). Mild degenerative changes of the right hip. Comminuted left parasymphyseal fracture (coronal image 43). Right anterior column acetabular fracture (series 3/image 71). Mildly displaced bilateral inferior pubic rami fractures (series 3/image 98). Subcutaneous hematoma along the right lateral hip/flank (series 5/image 102). Degenerative changes of the lumbar spine. IMPRESSION: Nondisplaced transcervical right femoral neck fracture. Right anterior column acetabular fracture. Left parasymphyseal and bilateral inferior pubic ramus fractures, as above. Associated extraperitoneal hemorrhage anterior to the bladder in the lower pelvis. Subcutaneous hematoma along the right lateral hip/flank. Electronically Signed   By: Julian Hy M.D.   On: 04/14/2021 03:53   CT ABDOMEN PELVIS W CONTRAST  Result Date: 04/26/2021 CLINICAL DATA:  Abdominal trauma EXAM: CT CHEST, ABDOMEN, AND PELVIS WITH CONTRAST TECHNIQUE: Multidetector CT imaging of the chest, abdomen and pelvis was performed following the standard protocol during bolus administration of intravenous contrast. CONTRAST:  119mL OMNIPAQUE IOHEXOL 300 MG/ML  SOLN COMPARISON:  CT pelvis earlier same day, CT chest 2015 FINDINGS: CT CHEST FINDINGS Cardiovascular: Normal heart size. Aortic valve replacement. No pericardial effusion. Coronary artery calcification. Thoracic aorta is normal in caliber with calcified plaque. Mediastinum/Nodes: No mediastinal hematoma. Diaphragmatic hernia with much of the stomach, distal pancreas, and splenic flexure within the chest. No enlarged nodes. Lungs/Pleura: Chronic left basilar atelectasis adjacent to hernia.  Dependent atelectasis on the right. Mild peribronchial thickening. Musculoskeletal: Chronic T7 and T8 compression fractures with marked loss of height and resulting focal kyphosis. No acute fracture. Prior median sternotomy. CT ABDOMEN PELVIS FINDINGS Hepatobiliary: No hepatic injury or perihepatic hematoma. Gallbladder is unremarkable. Pancreas: Normal main pancreatic duct. No apparent obstructing lesion. Spleen: Unremarkable. Adrenals/Urinary Tract:  Adrenals are unremarkable. Bilateral renal cysts. Bladder is unremarkable. Stomach/Bowel: Stomach is within normal limits. Bowel is normal in caliber. Vascular/Lymphatic: Aortic atherosclerosis. No enlarged lymph nodes. Reproductive: Unremarkable. Other: Slightly increased hemorrhage adjacent to the bladder. Subcutaneous hemorrhage right hip/thigh. Musculoskeletal: Acute fractures of the pelvis as previously described. There is also a nondisplaced fracture of the right iliac bone (series 2, image 77). IMPRESSION: Slight increase in extraperitoneal hemorrhage at the level of the bladder compared to earlier CT pelvis. In addition to previously described pelvic fractures, there is also an acute nondisplaced fracture of the right iliac bone. Chronic diaphragmatic hernia with abdominal contents within the left hemithorax. Top-normal main pancreatic duct without apparent obstructing lesion. Aortic atherosclerosis. Electronically Signed   By: Macy Mis M.D.   On: 04/16/2021 08:24   DG Chest Port 1 View  Result Date: 04/28/2021 CLINICAL DATA:  Preop exam EXAM: PORTABLE CHEST 1 VIEW COMPARISON:  07/28/2016, CT 07/23/2013 FINDINGS: Normal cardiac silhouette with midline sternotomy. Large diaphragmatic hernia on the LEFT with stomach and bowel within the hernia sac. Hernia occupies 2/3 of the LEFT hemithorax. No pulmonary edema. No pneumothorax. Focal infiltrate. IMPRESSION: 1. Progressive large LEFT diaphragmatic hernia. 2. No acute findings. Electronically Signed    By: Suzy Bouchard M.D.   On: 04/04/2021 05:13   DG Hip Unilat W or Wo Pelvis 2-3 Views Right  Result Date: 04/17/2021 CLINICAL DATA:  Recent fall with right hip pain, initial encounter EXAM: DG HIP (WITH OR WITHOUT PELVIS) 3V RIGHT COMPARISON:  None. FINDINGS: Considerable soft tissue swelling is noted over the greater trochanter laterally. Irregularity of the superior pubic ramus on the right is noted and there is lucency identified on 1 of the frontal films. Suspicious lucency is noted in the femoral neck as well without displacement. Degenerative changes of the hip joint are seen. IMPRESSION: Findings suspicious for undisplaced superior pubic ramus fracture and right femoral neck fracture. CT of the pelvis is recommended for further evaluation to exclude occult fracture. Electronically Signed   By: Inez Catalina M.D.   On: 04/05/2021 02:24    Anti-infectives: Anti-infectives (From admission, onward)    Start     Dose/Rate Route Frequency Ordered Stop   04/19/21 0600  ceFAZolin (ANCEF) IVPB 2g/100 mL premix        2 g 200 mL/hr over 30 Minutes Intravenous On call to O.R. 04/24/2021 2008 04/16/2021 0559   04/13/2021 1200  ceFAZolin (ANCEF) IVPB 2g/100 mL premix  Status:  Discontinued        2 g 200 mL/hr over 30 Minutes Intravenous On call to O.R. 04/26/2021 0558 04/21/2021 2021       Assessment/Plan: s/p Procedure(s) with comments: CANNULATED HIP PINNING (Right) - CRPP Right non displaced femoral neck fracture Hg 10.1. monitor GLF Pelvic fracture, R femoral neck fracture - consult ortho, may need OR for hip fx.  Patient is confirmed DNR but wife states to operate if needed.  Cards consult pending as patient having frequent small runs of VTACH R lat flank/hip subcutaneous hematoma - monitor H/H. Received TXA an d 1 unit of [facility]BCs at [facility]. Frequent Vtach - cards consult HTN - hold home meds due to intermittent hypotension HLD CAD - Dr. Stanford Breed is his primary cardiologist. Will have them assess  given frequent arrhythmia as well as thoughts on surgical tolerance Cerebrovascular disease Hypothyroidism - IV synthroid Frail state - will go ahead and involved palliative care medicine in this patient who has a tenuous outlook at this point given  his frailty and overall poor functional status. FEN - NPO/IVFs ID - ancef on call to OR if goes to OR with ortho VTE - Lovenox BID to start likely tomorrow   Dispo: admit to trauma service, inpatient to progress floor  LOS: 1 day    Autumn Messing III 04/19/2021

## 2021-04-19 NOTE — Progress Notes (Signed)
Pt assessed to have a foley but no active order found. MD Lovick on the unit notified and verbal order received for the foley. Will continue to closely monitor pt. Delia Heady RN

## 2021-04-19 NOTE — Consult Note (Signed)
Palliative Medicine Inpatient Consult Note  Consulting Provider: Georganna Skeans, MD  Reason for consult:   [facility] Palliative Medicine Consult  Reason for Consult? 87yo DNR, hip and pelvic FXs, cardiac HX, medical non-compliance   HPI:  Per intake H&P --> 87 yo male with medical history significant for HTN, hyperlipidemia, CAD, cerebrovascular disease who presented to [facility] ED via EMS after suffering a ground level fall at home. S/P right hip pinning. The Palliative Care team was asked to get involved to further address goals of care in the setting of his frail state and chronic noncompliance.   Clinical Assessment/Goals of Care:  *Please note that this is a verbal dictation therefore any spelling or grammatical errors are due to the "[place]" system interpretation.  I have reviewed medical records including EPIC notes, labs and imaging, received report from bedside RN, assessed the patient who was lying in bed with mittens on singing church songs.   I met with patient's wife, Stanton Kidney and niece, Judson Roch to further discuss diagnosis prognosis, GOC, EOL wishes, disposition and options.   I introduced Palliative Medicine as specialized medical care for people living with serious illness. It focuses on providing relief from the symptoms and stress of a serious illness. The goal is to improve quality of life for both the patient and the family.  Norton is originally from [place], [place].  He has lived in [place], [place] for many years now.  He and his wife Stanton Kidney have been married for 64 years.  They have no children.  He works in [employer] and then retired from [employer] firm in [place].  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  He plays piano and loves to sing gospel music.  He was raised as a Psychologist, forensic though does not have a strong faith per his wife.  Prior to hospitalization Calab was able to walk with a cane.  He  was able to bathe himself though not very well per his wife and dress himself.  He was able to self feed.   A detailed discussion was had today regarding advanced directives, patient does not have these on file though his wife is clear that he would never want to live in a prolonged vegetative state.    Concepts specific to code status, artifical feeding and hydration, continued IV antibiotics and rehospitalization was had.  Patient is an established DNAR/DNI.   We reviewed the patients PMH inclusive of his lack of compliance with medication administration for the past year.  We discussed his history of coronary artery disease and and cognitive impairment.  We reviewed the plan for Monique to go to the OR at 11:00 today for a hip pinning.  I shared information regarding patient's expected hospital course in terms of delirium.  We reviewed the best case and worst-case scenarios of the patient's recovery process.  We discussed the best case of him going to rehabilitation gaining strength and going back home to his prior level of function.  We reviewed the worst case scenario that he goes to rehabilitation and does not participate and continues to decline.  I shared that most patients end up somewhere in the middle of the spectrum.  We discussed the goals for improvement of the patient.  I shared it may be of utility to have an outpatient palliative provider follow along with Jenny Reichmann given his comorbid conditions and overall frail state.  Patient's wife was amenable to  this as she feels it is an extra set of eyes and layer of support for she and her family.  Discussed the importance of continued conversation with family and their  medical providers regarding overall plan of care and treatment options, ensuring decisions are within the context of the patients values and GOCs.  Decision Maker: Secundino Ellithorpe (spouse) [phone]  SUMMARY OF RECOMMENDATIONS   DNAR/DNI  Treat what is treatable  Goals are for  strengthening at rehabilitation  An open and honest conversation was held regarding best case worst-case scenarios in terms of the rehabilitation journey  Delirium precautions  Appreciate transitions of care setting up outpatient palliative support  Ongoing palliative support incrementally   Code Status/Advance Care Planning: DNAR/DNI  Palliative Prophylaxis:  Oral care, delirium prevention  Additional Recommendations (Limitations, Scope, Preferences): Treat what is treatable   Psycho-social/Spiritual:  Desire for further Chaplaincy support: No Additional Recommendations: Education on postoperative course in the setting of a hip pinning   Prognosis: Unclear at this time  Discharge Planning: Discharge will likely be to skilled nursing  Vitals:   04/19/21 1057 04/19/21 1613  BP: 100/82 98/77  Pulse: 99 90  Resp: 20 12  Temp:  98 F (36.7 C)  SpO2: 98% 100%    Intake/Output Summary (Last 24 hours) at 04/19/2021 1724 Last data filed at 04/19/2021 1611 Gross per 24 hour  Intake 913.31 ml  Output 300 ml  Net 613.31 ml   Last Weight  Most recent update: 04/14/2021  1:38 AM    Weight  60 kg (132 lb 4.4 oz)            Gen: Frail elderly male HEENT: moist mucous membranes CV: Regular rate and rhythm PULM: clear to auscultation bilaterally ABD: soft/nontender EXT: No edema, ecchymosis noted on bilateral upper extremities Neuro: Disoriented  PPS: 20%   This conversation/these recommendations were discussed with trauma surgery team  Time In: 0700 Time Out: 0810 Total Time: 70 Greater than 50%  of this time was spent counseling and coordinating care related to the above assessment and plan.  [facility] Team Team Cell Phone: [phone] Please utilize secure chat with additional questions, if there is no response within 30 minutes please call the above phone number  Palliative Medicine Team providers are available by  phone from 7am to 7pm daily and can be reached through the team cell phone.  Should this patient require assistance outside of these hours, please call the patient's attending physician.

## 2021-04-19 NOTE — Progress Notes (Signed)
Preop consult note reviewed from Dr Johnsie Cancel, please see regarding cardiac evaluation. No additonal cardiac recs at this time, please call with any questions. Tele reviewed, no significant arrhythmias   Carlyle Dolly MD

## 2021-04-20 ENCOUNTER — Encounter (HOSPITAL_COMMUNITY): Payer: Self-pay | Admitting: General Surgery

## 2021-04-20 ENCOUNTER — Inpatient Hospital Stay (HOSPITAL_COMMUNITY): Payer: Medicare Other | Admitting: Anesthesiology

## 2021-04-20 ENCOUNTER — Inpatient Hospital Stay (HOSPITAL_COMMUNITY): Payer: Medicare Other

## 2021-04-20 ENCOUNTER — Encounter (HOSPITAL_COMMUNITY): Admission: EM | Disposition: E | Payer: Self-pay | Source: Home / Self Care

## 2021-04-20 HISTORY — PX: HIP PINNING,CANNULATED: SHX1758

## 2021-04-20 LAB — COMPREHENSIVE METABOLIC PANEL
ALT: 25 U/L (ref 0–44)
AST: 46 U/L — ABNORMAL HIGH (ref 15–41)
Albumin: 2.9 g/dL — ABNORMAL LOW (ref 3.5–5.0)
Alkaline Phosphatase: 24 U/L — ABNORMAL LOW (ref 38–126)
Anion gap: 8 (ref 5–15)
BUN: 30 mg/dL — ABNORMAL HIGH (ref 8–23)
CO2: 24 mmol/L (ref 22–32)
Calcium: 8.4 mg/dL — ABNORMAL LOW (ref 8.9–10.3)
Chloride: 109 mmol/L (ref 98–111)
Creatinine, Ser: 1.11 mg/dL (ref 0.61–1.24)
GFR, Estimated: >60 mL/min (ref >60)
Glucose, Bld: 146 mg/dL — ABNORMAL HIGH (ref 70–99)
Potassium: 4.5 mmol/L (ref 3.5–5.1)
Sodium: 141 mmol/L (ref 135–145)
Total Bilirubin: 0.9 mg/dL (ref 0.3–1.2)
Total Protein: 5 g/dL — ABNORMAL LOW (ref 6.5–8.1)

## 2021-04-20 LAB — CBC WITH DIFFERENTIAL/PLATELET
Abs Immature Granulocytes: 0.03 10*3/uL (ref 0.00–0.07)
Abs Immature Granulocytes: 0.03 10*3/uL (ref 0.00–0.07)
Basophils Absolute: 0 10*3/uL (ref 0.0–0.1)
Basophils Absolute: 0 10*3/uL (ref 0.0–0.1)
Basophils Relative: 0 %
Basophils Relative: 0 %
Eosinophils Absolute: 0.2 10*3/uL (ref 0.0–0.5)
Eosinophils Absolute: 0 10*3/uL (ref 0.0–0.5)
Eosinophils Relative: 2 %
Eosinophils Relative: 0 %
HCT: 28.1 % — ABNORMAL LOW (ref 39.0–52.0)
HCT: 26.9 % — ABNORMAL LOW (ref 39.0–52.0)
Hemoglobin: 9.3 g/dL — ABNORMAL LOW (ref 13.0–17.0)
Hemoglobin: 8.5 g/dL — ABNORMAL LOW (ref 13.0–17.0)
Immature Granulocytes: 0 %
Immature Granulocytes: 1 %
Lymphocytes Relative: 9 %
Lymphocytes Relative: 9 %
Lymphs Abs: 0.7 10*3/uL (ref 0.7–4.0)
Lymphs Abs: 0.5 10*3/uL — ABNORMAL LOW (ref 0.7–4.0)
MCH: 32.9 pg (ref 26.0–34.0)
MCH: 32.2 pg (ref 26.0–34.0)
MCHC: 33.1 g/dL (ref 30.0–36.0)
MCHC: 31.6 g/dL (ref 30.0–36.0)
MCV: 99.3 fL (ref 80.0–100.0)
MCV: 101.9 fL — ABNORMAL HIGH (ref 80.0–100.0)
Monocytes Absolute: 0.4 10*3/uL (ref 0.1–1.0)
Monocytes Absolute: 0.1 10*3/uL (ref 0.1–1.0)
Monocytes Relative: 5 %
Monocytes Relative: 2 %
Neutro Abs: 7 10*3/uL (ref 1.7–7.7)
Neutro Abs: 5.3 10*3/uL (ref 1.7–7.7)
Neutrophils Relative %: 84 %
Neutrophils Relative %: 88 %
Platelets: 67 10*3/uL — ABNORMAL LOW (ref 150–400)
Platelets: 63 10*3/uL — ABNORMAL LOW (ref 150–400)
RBC: 2.83 MIL/uL — ABNORMAL LOW (ref 4.22–5.81)
RBC: 2.64 MIL/uL — ABNORMAL LOW (ref 4.22–5.81)
RDW: 14.1 % (ref 11.5–15.5)
RDW: 14.2 % (ref 11.5–15.5)
WBC: 8.3 10*3/uL (ref 4.0–10.5)
WBC: 5.9 10*3/uL (ref 4.0–10.5)
nRBC: 0 % (ref 0.0–0.2)
nRBC: 0 % (ref 0.0–0.2)

## 2021-04-20 LAB — COMPREHENSIVE METABOLIC PANEL WITH GFR
ALT: 28 U/L (ref 0–44)
AST: 57 U/L — ABNORMAL HIGH (ref 15–41)
Albumin: 3 g/dL — ABNORMAL LOW (ref 3.5–5.0)
Alkaline Phosphatase: 29 U/L — ABNORMAL LOW (ref 38–126)
Anion gap: 7 (ref 5–15)
BUN: 34 mg/dL — ABNORMAL HIGH (ref 8–23)
CO2: 23 mmol/L (ref 22–32)
Calcium: 8.6 mg/dL — ABNORMAL LOW (ref 8.9–10.3)
Chloride: 110 mmol/L (ref 98–111)
Creatinine, Ser: 1.25 mg/dL — ABNORMAL HIGH (ref 0.61–1.24)
GFR, Estimated: 55 mL/min — ABNORMAL LOW
Glucose, Bld: 116 mg/dL — ABNORMAL HIGH (ref 70–99)
Potassium: 4.1 mmol/L (ref 3.5–5.1)
Sodium: 140 mmol/L (ref 135–145)
Total Bilirubin: 1.2 mg/dL (ref 0.3–1.2)
Total Protein: 5.1 g/dL — ABNORMAL LOW (ref 6.5–8.1)

## 2021-04-20 LAB — SURGICAL PCR SCREEN
MRSA, PCR: NEGATIVE
Staphylococcus aureus: NEGATIVE

## 2021-04-20 SURGERY — FIXATION, FEMUR, NECK, PERCUTANEOUS, USING SCREW
Anesthesia: General | Site: Hip | Laterality: Right

## 2021-04-20 MED ORDER — PROPOFOL 10 MG/ML IV BOLUS
INTRAVENOUS | Status: DC | PRN
  Administered 2021-04-20: 50 mg via INTRAVENOUS

## 2021-04-20 MED ORDER — FENTANYL CITRATE (PF) 250 MCG/5ML IJ SOLN
INTRAMUSCULAR | Status: AC
  Filled 2021-04-20: qty 5

## 2021-04-20 MED ORDER — SUGAMMADEX SODIUM 200 MG/2ML IV SOLN
INTRAVENOUS | Status: DC | PRN
  Administered 2021-04-20: 200 mg via INTRAVENOUS

## 2021-04-20 MED ORDER — ROCURONIUM BROMIDE 10 MG/ML (PF) SYRINGE
PREFILLED_SYRINGE | INTRAVENOUS | Status: DC | PRN
  Administered 2021-04-20: 40 mg via INTRAVENOUS

## 2021-04-20 MED ORDER — METOCLOPRAMIDE HCL 5 MG PO TABS
5.0000 mg | ORAL_TABLET | Freq: Three times a day (TID) | ORAL | Status: DC | PRN
  Filled 2021-04-20: qty 2

## 2021-04-20 MED ORDER — METHOCARBAMOL 1000 MG/10ML IJ SOLN
500.0000 mg | Freq: Four times a day (QID) | INTRAVENOUS | Status: DC | PRN
  Filled 2021-04-20: qty 5

## 2021-04-20 MED ORDER — LIDOCAINE-PRILOCAINE 2.5-2.5 % EX CREA
TOPICAL_CREAM | CUTANEOUS | Status: AC
  Filled 2021-04-20: qty 5

## 2021-04-20 MED ORDER — SODIUM CHLORIDE 0.9 % IV BOLUS
250.0000 mL | Freq: Once | INTRAVENOUS | Status: AC
  Administered 2021-04-20: 250 mL via INTRAVENOUS

## 2021-04-20 MED ORDER — ACETAMINOPHEN 325 MG PO TABS
325.0000 mg | ORAL_TABLET | Freq: Four times a day (QID) | ORAL | Status: DC | PRN
  Filled 2021-04-20: qty 1

## 2021-04-20 MED ORDER — ALUM & MAG HYDROXIDE-SIMETH 200-200-20 MG/5ML PO SUSP: 30.0000 mL | ORAL | Status: DC | PRN

## 2021-04-20 MED ORDER — FENTANYL CITRATE (PF) 250 MCG/5ML IJ SOLN
INTRAMUSCULAR | Status: DC | PRN
  Administered 2021-04-20 (×3): 50 ug via INTRAVENOUS

## 2021-04-20 MED ORDER — DEXAMETHASONE SODIUM PHOSPHATE 10 MG/ML IJ SOLN
INTRAMUSCULAR | Status: DC | PRN
  Administered 2021-04-20: 10 mg via INTRAVENOUS

## 2021-04-20 MED ORDER — FENTANYL CITRATE (PF) 100 MCG/2ML IJ SOLN: 25.0000 ug | INTRAMUSCULAR | Status: DC | PRN

## 2021-04-20 MED ORDER — MENTHOL 3 MG MT LOZG: 1.0000 | LOZENGE | OROMUCOSAL | Status: DC | PRN

## 2021-04-20 MED ORDER — ACETAMINOPHEN 10 MG/ML IV SOLN
INTRAVENOUS | Status: DC | PRN
  Administered 2021-04-20: 1000 mg via INTRAVENOUS

## 2021-04-20 MED ORDER — ACETAMINOPHEN 10 MG/ML IV SOLN: 1000.0000 mg | Freq: Once | INTRAVENOUS | Status: DC | PRN

## 2021-04-20 MED ORDER — METOCLOPRAMIDE HCL 5 MG/ML IJ SOLN: 5.0000 mg | Freq: Three times a day (TID) | INTRAMUSCULAR | Status: DC | PRN

## 2021-04-20 MED ORDER — HALOPERIDOL LACTATE 5 MG/ML IJ SOLN
2.0000 mg | Freq: Four times a day (QID) | INTRAMUSCULAR | Status: DC | PRN
  Administered 2021-04-20 – 2021-04-23 (×8): 2 mg via INTRAVENOUS
  Filled 2021-04-20 (×8): qty 1

## 2021-04-20 MED ORDER — ONDANSETRON HCL 4 MG/2ML IJ SOLN
INTRAMUSCULAR | Status: DC | PRN
  Administered 2021-04-20: 4 mg via INTRAVENOUS

## 2021-04-20 MED ORDER — PHENOL 1.4 % MT LIQD: 1.0000 | OROMUCOSAL | Status: DC | PRN

## 2021-04-20 MED ORDER — ACETAMINOPHEN 160 MG/5ML PO SOLN: 1000.0000 mg | Freq: Once | ORAL | Status: DC | PRN

## 2021-04-20 MED ORDER — PHENYLEPHRINE HCL-NACL 20-0.9 MG/250ML-% IV SOLN
INTRAVENOUS | Status: DC | PRN
  Administered 2021-04-20: 50 ug/min via INTRAVENOUS

## 2021-04-20 MED ORDER — 0.9 % SODIUM CHLORIDE (POUR BTL) OPTIME
TOPICAL | Status: DC | PRN
  Administered 2021-04-20: 1000 mL

## 2021-04-20 MED ORDER — PHENYLEPHRINE HCL (PRESSORS) 10 MG/ML IV SOLN
INTRAVENOUS | Status: DC | PRN
Start: 2021-04-20 — End: 2021-04-20
  Administered 2021-04-20 (×2): 80 ug via INTRAVENOUS

## 2021-04-20 MED ORDER — BISACODYL 10 MG RE SUPP: 10.0000 mg | Freq: Every day | RECTAL | Status: DC | PRN

## 2021-04-20 MED ORDER — OXYCODONE HCL 5 MG/5ML PO SOLN
5.0000 mg | Freq: Once | ORAL | Status: DC | PRN
Start: 2021-04-20 — End: 2021-04-20

## 2021-04-20 MED ORDER — HYDROCODONE-ACETAMINOPHEN 7.5-325 MG PO TABS: 1.0000 | ORAL_TABLET | ORAL | Status: DC | PRN

## 2021-04-20 MED ORDER — OXYCODONE HCL 5 MG PO TABS: 5.0000 mg | ORAL_TABLET | Freq: Once | ORAL | Status: DC | PRN

## 2021-04-20 MED ORDER — METHOCARBAMOL 500 MG PO TABS
500.0000 mg | ORAL_TABLET | Freq: Four times a day (QID) | ORAL | Status: DC | PRN
  Administered 2021-04-22 (×2): 500 mg via ORAL
  Filled 2021-04-20 (×2): qty 1

## 2021-04-20 MED ORDER — TRAMADOL HCL 50 MG PO TABS: 50.0000 mg | ORAL_TABLET | Freq: Four times a day (QID) | ORAL | Status: DC

## 2021-04-20 MED ORDER — LACTATED RINGERS IV SOLN: INTRAVENOUS | Status: DC

## 2021-04-20 MED ORDER — ALBUMIN HUMAN 5 % IV SOLN: INTRAVENOUS | Status: DC | PRN

## 2021-04-20 MED ORDER — CEFAZOLIN SODIUM-DEXTROSE 1-4 GM/50ML-% IV SOLN
1.0000 g | Freq: Three times a day (TID) | INTRAVENOUS | Status: AC
Start: 2021-04-20 — End: 2021-04-22
  Administered 2021-04-20 – 2021-04-21 (×2): 1 g via INTRAVENOUS
  Filled 2021-04-20 (×2): qty 50

## 2021-04-20 MED ORDER — CEFAZOLIN SODIUM-DEXTROSE 2-4 GM/100ML-% IV SOLN
INTRAVENOUS | Status: AC
  Filled 2021-04-20: qty 100

## 2021-04-20 MED ORDER — TRANEXAMIC ACID-NACL 1000-0.7 MG/100ML-% IV SOLN
1000.0000 mg | Freq: Once | INTRAVENOUS | Status: AC
  Administered 2021-04-20: 1000 mg via INTRAVENOUS
  Filled 2021-04-20: qty 100

## 2021-04-20 MED ORDER — HYDROCODONE-ACETAMINOPHEN 5-325 MG PO TABS: 1.0000 | ORAL_TABLET | ORAL | Status: DC | PRN

## 2021-04-20 MED ORDER — CHLORHEXIDINE GLUCONATE 0.12 % MT SOLN: 15.0000 mL | Freq: Once | OROMUCOSAL | Status: AC

## 2021-04-20 MED ORDER — CEFAZOLIN SODIUM-DEXTROSE 2-3 GM-%(50ML) IV SOLR
INTRAVENOUS | Status: DC | PRN
  Administered 2021-04-20: 2 g via INTRAVENOUS

## 2021-04-20 MED ORDER — ACETAMINOPHEN 500 MG PO TABS: 500.0000 mg | ORAL_TABLET | Freq: Four times a day (QID) | ORAL | Status: AC

## 2021-04-20 MED ORDER — POLYETHYLENE GLYCOL 3350 17 G PO PACK: 17.0000 g | PACK | Freq: Every day | ORAL | Status: DC | PRN

## 2021-04-20 MED ORDER — ALBUMIN HUMAN 25 % IV SOLN
12.5000 g | Freq: Once | INTRAVENOUS | Status: AC
  Administered 2021-04-20: 12.5 g via INTRAVENOUS
  Filled 2021-04-20: qty 50

## 2021-04-20 MED ORDER — DOCUSATE SODIUM 100 MG PO CAPS
100.0000 mg | ORAL_CAPSULE | Freq: Two times a day (BID) | ORAL | Status: DC
Start: 2021-04-20 — End: 2021-04-24
  Administered 2021-04-23: 100 mg via ORAL
  Filled 2021-04-20 (×5): qty 1

## 2021-04-20 MED ORDER — ACETAMINOPHEN 500 MG PO TABS: 1000.0000 mg | ORAL_TABLET | Freq: Once | ORAL | Status: DC | PRN

## 2021-04-20 MED ORDER — PROPOFOL 10 MG/ML IV BOLUS
INTRAVENOUS | Status: AC
  Filled 2021-04-20: qty 20

## 2021-04-20 MED ORDER — MORPHINE SULFATE (PF) 2 MG/ML IV SOLN: 0.5000 mg | INTRAVENOUS | Status: DC | PRN

## 2021-04-20 MED ORDER — ALBUMIN HUMAN 5 % IV SOLN: 12.5000 g | Freq: Once | INTRAVENOUS | Status: DC

## 2021-04-20 MED ORDER — CHLORHEXIDINE GLUCONATE 0.12 % MT SOLN
OROMUCOSAL | Status: AC
  Administered 2021-04-20: 15 mL via OROMUCOSAL
  Filled 2021-04-20: qty 15

## 2021-04-20 SURGICAL SUPPLY — 31 items
BIT DRILL 4.9 CANNULATED (BIT) ×1
BIT DRILL CANN QC 4.9 LRG (BIT) IMPLANT
BNDG COHESIVE 4X5 TAN STRL (GAUZE/BANDAGES/DRESSINGS) ×2 IMPLANT
BNDG GAUZE ELAST 4 BULKY (GAUZE/BANDAGES/DRESSINGS) ×2 IMPLANT
COVER PERINEAL POST (MISCELLANEOUS) ×2 IMPLANT
DRAPE STERI IOBAN 125X83 (DRAPES) ×2 IMPLANT
DRESSING MEPILEX FLEX 4X4 (GAUZE/BANDAGES/DRESSINGS) IMPLANT
DRILL BIT CANNULATED 4.9 (BIT) ×2
DRSG MEPILEX BORDER 4X4 (GAUZE/BANDAGES/DRESSINGS) ×2 IMPLANT
DRSG MEPILEX FLEX 4X4 (GAUZE/BANDAGES/DRESSINGS) ×2
DURAPREP 26ML APPLICATOR (WOUND CARE) ×2 IMPLANT
GLOVE SRG 8 PF TXTR STRL LF DI (GLOVE) ×1 IMPLANT
GLOVE SURG ENC MOIS LTX SZ7.5 (GLOVE) ×4 IMPLANT
GLOVE SURG UNDER POLY LF SZ7.5 (GLOVE) ×3 IMPLANT
GLOVE SURG UNDER POLY LF SZ8 (GLOVE) ×4
GOWN STRL REUS W/ TWL LRG LVL3 (GOWN DISPOSABLE) ×3 IMPLANT
GOWN STRL REUS W/TWL LRG LVL3 (GOWN DISPOSABLE) ×6
GUIDEWIRE ASNIS 3.2 NONCAL (WIRE) ×3 IMPLANT
KIT BASIN OR (CUSTOM PROCEDURE TRAY) ×2 IMPLANT
KIT TURNOVER KIT A (KITS) ×2 IMPLANT
NS IRRIG 1000ML POUR BTL (IV SOLUTION) ×2 IMPLANT
PACK GENERAL/GYN (CUSTOM PROCEDURE TRAY) ×2 IMPLANT
PAD ARMBOARD 7.5X6 YLW CONV (MISCELLANEOUS) ×4 IMPLANT
SCREW CANN ASNIS 6.5X95 (Screw) ×2 IMPLANT
SCREW CANNULATED ASNIS 6.5X105 (Screw) ×1 IMPLANT
STRIP CLOSURE SKIN 1/2X4 (GAUZE/BANDAGES/DRESSINGS) ×1 IMPLANT
SUT MON AB 2-0 CT1 36 (SUTURE) ×2 IMPLANT
SUT VIC AB 0 CT1 27 (SUTURE) ×2
SUT VIC AB 0 CT1 27XBRD ANBCTR (SUTURE) IMPLANT
TOWEL OR 17X26 10 PK STRL BLUE (TOWEL DISPOSABLE) ×4 IMPLANT
WASHER SCREW MATTA SS 13.0X1.5 (Washer) ×1 IMPLANT

## 2021-04-20 NOTE — Progress Notes (Addendum)
TRN was walking on unit and noticed patients BP was 86/58. I verified placement and retook the BP and it was 94/57. I noticed blood pressures, on average, were higher prior to surgery. Pt currently laying in bed and resting. Dr. Barry Dienes notified and order obtained for a stat CBC and 226mL 5% albumin bolus. TRN contacted Roderic Palau in pharmacy helped with correct order of Albumin. Primary RN notified of these orders.   2154- TRN placed a 20G IV to the right forearm and blood work obtained, including CBC and sent to lab. Primary RN at bedside and assisted.

## 2021-04-20 NOTE — Progress Notes (Signed)
Patient returned to room from PACU alert to self, noted combative as staffs repositioning him and attempting to pull lines and tubings. Patient refused oral medications. Pain medication admin per MAR. Will continue to monitor.

## 2021-04-20 NOTE — Anesthesia Procedure Notes (Signed)
Procedure Name: Intubation Date/Time: 04/19/2021 2:10 PM Performed by: Clearnce Sorrel, CRNA Pre-anesthesia Checklist: Patient identified, Emergency Drugs available, Suction available and Patient being monitored Patient Re-evaluated:Patient Re-evaluated prior to induction Oxygen Delivery Method: Circle System Utilized Preoxygenation: Pre-oxygenation with 100% oxygen Induction Type: IV induction Ventilation: Mask ventilation without difficulty Laryngoscope Size: Mac and 3 Grade View: Grade I Tube type: Oral Tube size: 7.5 mm Number of attempts: 1 Airway Equipment and Method: Stylet and Oral airway Placement Confirmation: ETT inserted through vocal cords under direct vision, positive ETCO2 and breath sounds checked- equal and bilateral Secured at: 23 cm Tube secured with: Tape Dental Injury: Teeth and Oropharynx as per pre-operative assessment

## 2021-04-20 NOTE — Interval H&P Note (Signed)
History and Physical Interval Note:  04/16/2021 7:26 AM  [name]  has presented today for surgery, with the diagnosis of Right Fx Hip.  The various methods of treatment have been discussed with the patient and family. After consideration of risks, benefits and other options for treatment, the patient has consented to  Procedure(s): CANNULATED HIP PINNING (Right) as a surgical intervention.  The patient's history has been reviewed, patient examined, no change in status, stable for surgery.  I have reviewed the patient's chart and labs.  Questions were answered to the patient's satisfaction.     Renette Butters

## 2021-04-20 NOTE — Op Note (Signed)
04/22/2021  3:46 PM  PATIENT:  [name]    PRE-OPERATIVE DIAGNOSIS:  Right Fx Hip  POST-OPERATIVE DIAGNOSIS:  Same  PROCEDURE:  CANNULATED HIP PINNING  SURGEON:  Renette Butters, MD  ASSISTANT: Aggie Moats, PA-C, he was present and scrubbed throughout the case, critical for completion in a timely fashion, and for retraction, instrumentation, and closure.   ANESTHESIA:   General  PREOPERATIVE INDICATIONS:  [name] is a  87 y.o. male who fell and was found to have a diagnosis of Right Fx Hip who elected for surgical management.    The risks benefits and alternatives were discussed with the patient preoperatively including but not limited to the risks of infection, bleeding, nerve injury, cardiopulmonary complications, blood clots, malunion, nonunion, avascular necrosis, the need for revision surgery, the potential for conversion to hemiarthroplasty, among others, and the patient was willing to proceed.  OPERATIVE IMPLANTS: 6.5 mm cannulated screws x3  OPERATIVE FINDINGS: Clinical osteoporosis with weak bone, proximal femur  OPERATIVE PROCEDURE: The patient was brought to the operating room and placed in supine position. IV antibiotics were given. General anesthesia administered. Foley was also given. The patient was placed on the fracture table. The operative extremity was positioned, without any significant reduction maneuver and was prepped and draped in usual sterile fashion.  Time out was performed.  Small incisions were made distal to the greater trochanter, and 3 guidewires were introduced Into an inverted triangle configuration. The lengths were measured. The reduction was slightly valgus, and near-anatomic. I opened the cortex with a cannulated drill, and then placed the screws into position. Satisfactory fixation was achieved. I sequentially tightened the screws by hand.  I performed a live fluoroscopic exam and no screw penetrance was noted. All threads crossed the  fracture site.   The wounds were irrigated copiously, and repaired with Vicryl with Steri-Strips and sterile gauze. There no complications and the patient tolerated the procedure well.  The patient will be weightbearing as tolerated, VTE prophylaxis will be: mobilization and chemical px

## 2021-04-20 NOTE — Progress Notes (Incomplete)
Patient returned to room from PACU alert to self, noted combative and attempting to pull lines and tubings.

## 2021-04-20 NOTE — Transfer of Care (Signed)
Immediate Anesthesia Transfer of Care Note  Patient: [name]  Procedure(s) Performed: CANNULATED HIP PINNING (Right: Hip)  Patient Location: PACU  Anesthesia Type:General  Level of Consciousness: drowsy  Airway & Oxygen Therapy: Patient Spontanous Breathing and Patient connected to nasal cannula oxygen  Post-op Assessment: Report given to RN and Post -op Vital signs reviewed and stable  Post vital signs: Reviewed and stable  Last Vitals:  Vitals Value Taken Time  BP    Temp    Pulse    Resp    SpO2      Last Pain:  Vitals:   04/07/2021 1123  TempSrc:   PainSc: 0-No pain      Patients Stated Pain Goal: 0 (06/30/70 5366)  Complications: No notable events documented.

## 2021-04-20 NOTE — Anesthesia Preprocedure Evaluation (Signed)
Anesthesia Evaluation  Patient identified by MRN, date of birth, ID band Patient confused    Reviewed: Allergy & Precautions, H&P , NPO status , Patient's Chart, lab work & pertinent test results, reviewed documented beta blocker date and time   History of Anesthesia Complications Negative for: history of anesthetic complications  Airway Mallampati: II  TM Distance: >3 FB Neck ROM: Full    Dental  (+) Poor Dentition, Dental Advisory Given,    Pulmonary shortness of breath and with exertion,    breath sounds clear to auscultation       Cardiovascular hypertension, Pt. on medications and Pt. on home beta blockers + CAD and + Past MI  + Valvular Problems/Murmurs  Rhythm:Irregular Rate:Normal - Systolic murmurs 1. Left ventricular ejection fraction, by visual estimation, is 55 to  60%. The left ventricle has normal function. There is mildly increased  left ventricular hypertrophy.  2. Elevated left ventricular end-diastolic pressure.  3. Left ventricular diastolic parameters are consistent with Grade II  diastolic dysfunction (pseudonormalization).  4. The left ventricle has no regional wall motion abnormalities.  5. Global right ventricle has low normal systolic function.The right  ventricular size is mildly enlarged. No increase in right ventricular wall  thickness.  6. Left atrial size was mild-moderately dilated.  7. Right atrial size was normal.  8. The mitral valve is grossly normal. No evidence of mitral valve  regurgitation.  9. The tricuspid valve is grossly normal.  10. The tricuspid valve is grossly normal. Tricuspid valve regurgitation  is mild.  11. The aortic valve was not well visualized. However, bioprosthetic valve  appears to be functioning normally. Aortic valve regurgitation is not  visualized. No evidence of aortic valve sclerosis or stenosis.  12. The pulmonic valve was not well visualized. Pulmonic  valve  regurgitation is not visualized.  13. Mildly elevated pulmonary artery systolic pressure.  14. The interatrial septum was not well visualized.    Neuro/Psych    GI/Hepatic negative GI ROS, Neg liver ROS,   Endo/Other  Hypothyroidism   Renal/GU Renal InsufficiencyRenal diseaseLab Results      Component                Value               Date                      CREATININE               1.25 (H)            04/16/2021           Lab Results      Component                Value               Date                      K                        4.1                 04/08/2021                Musculoskeletal Right Fx Hip   Abdominal   Peds  Hematology  (+) Blood dyscrasia, anemia , Lab Results      Component  Value               Date                      WBC                      8.3                 04/28/2021                HGB                      9.3 (L)             04/15/2021                HCT                      28.1 (L)            04/14/2021                MCV                      99.3                04/15/2021                PLT                      67 (L)              04/17/2021            thrombocytopenia   Anesthesia Other Findings S/P aortic valve replacement with bioprosthetic valve  Reproductive/Obstetrics                             Anesthesia Physical Anesthesia Plan  ASA: 4  Anesthesia Plan: General   Post-op Pain Management:    Induction: Intravenous  PONV Risk Score and Plan: 2 and Ondansetron and Dexamethasone  Airway Management Planned: Oral ETT  Additional Equipment: None  Intra-op Plan:   Post-operative Plan: Extubation in OR  Informed Consent: I have reviewed the patients History and Physical, chart, labs and discussed the procedure including the risks, benefits and alternatives for the proposed anesthesia with the patient or authorized representative who has indicated his/her understanding and  acceptance.    Discussed DNR with power of attorney and Continue DNR.   Dental advisory given and Consent reviewed with POA  Plan Discussed with: CRNA and Anesthesiologist  Anesthesia Plan Comments:         Anesthesia Quick Evaluation

## 2021-04-20 NOTE — Progress Notes (Signed)
Day of Surgery   Subjective/Chief Complaint: Interactive, but not really making sense.    Objective: Vital signs in last 24 hours: Temp:  [98 F (36.7 C)-99.9 F (37.7 C)] 98.9 F (37.2 C) (10/23 0752) Pulse Rate:  [90-121] 97 (10/23 0752) Resp:  [10-23] 18 (10/23 0812) BP: (97-138)/(44-107) 119/88 (10/23 0812) SpO2:  [88 %-100 %] 93 % (10/23 0812)    Intake/Output from previous day: 10/22 0701 - 10/23 0700 In: 494.8 [I.V.:494.8] Out: 600 [Urine:600] Intake/Output this shift: No intake/output data recorded.  Gen: cachectic, looks uncomfortable Resp: no respiratory distress CV RR&R Abd soft, non tender Ext warm and well perfused.   Lab Results:  Recent Labs    04/19/21 1621 04/03/2021 0426  WBC 9.6 8.3  HGB 9.1* 9.3*  HCT 27.9* 28.1*  PLT 61* 67*   BMET Recent Labs    04/19/21 1621 04/26/2021 0426  NA 140 140  K 5.0 4.1  CL 109 110  CO2 26 23  GLUCOSE 127* 116*  BUN 42* 34*  CREATININE 1.45* 1.25*  CALCIUM 8.5* 8.6*   PT/INR Recent Labs    04/17/2021 0433  LABPROT 16.5*  INR 1.3*   ABG No results for input(s): PHART, HCO3 in the last 72 hours.  Invalid input(s): PCO2, PO2  Studies/Results: CT Head Wo Contrast  Result Date: 04/02/2021 CLINICAL DATA:  Mechanical fall.  Unable to get up. EXAM: CT HEAD WITHOUT CONTRAST TECHNIQUE: Contiguous axial images were obtained from the base of the skull through the vertex without intravenous contrast. COMPARISON:  None. FINDINGS: Brain: Age related volume loss. Mild chronic small-vessel ischemic change of the white matter. No sign of acute infarction, mass lesion, hemorrhage, hydrocephalus or extra-axial collection. Vascular: There is atherosclerotic calcification of the major vessels at the base of the brain. Skull: Negative Sinuses/Orbits: Clear/normal Other: None IMPRESSION: No acute or traumatic finding. Age related volume loss and chronic small-vessel ischemic change of the white matter. Electronically Signed    By: Nelson Chimes M.D.   On: 04/19/2021 14:53   CT Cervical Spine Wo Contrast  Result Date: 04/19/2021 CLINICAL DATA:  [place].  Found on the floor. EXAM: CT CERVICAL SPINE WITHOUT CONTRAST TECHNIQUE: Multidetector CT imaging of the cervical spine was performed without intravenous contrast. Multiplanar CT image reconstructions were also generated. COMPARISON:  None FINDINGS: Alignment: The study suffers from motion degradation. No traumatic malalignment. Skull base and vertebrae: No evidence of regional fracture or focal bone lesion. Soft tissues and spinal canal: No evidence of soft tissue swelling. Disc levels: Motion degradation as noted above. Facet osteoarthritis on the right at C2-3, C3-4 and C7-T1 and on the left at C3-4 and C4-5. Disc space narrowing throughout the cervical region. Vacuum phenomenon of the C6-7 level, probably degenerative. Upper chest: Not included Other: Negative/none IMPRESSION: No acute or traumatic finding. Motion degraded examination. Chronic appearing spondylosis and facet osteoarthritis without acute traumatic finding. Electronically Signed   By: Nelson Chimes M.D.   On: 04/08/2021 14:56    Anti-infectives: Anti-infectives (From admission, onward)    Start     Dose/Rate Route Frequency Ordered Stop   04/19/21 0600  ceFAZolin (ANCEF) IVPB 2g/100 mL premix        2 g 200 mL/hr over 30 Minutes Intravenous On call to O.R. 04/24/2021 2008 04/13/2021 0559   04/06/2021 1200  ceFAZolin (ANCEF) IVPB 2g/100 mL premix  Status:  Discontinued        2 g 200 mL/hr over 30 Minutes Intravenous On call to O.R. 04/19/2021  3254 04/06/2021 2021       Assessment/Plan:  GLF Pelvic fracture, R femoral neck fracture - Ortho following.  Patient is confirmed DNR but wife states to operate if needed.  Cards consult for frequent small runs of VTACH R lat flank/hip subcutaneous hematoma - monitor H/H. Received TXA an d 1 unit of PRBCs at [facility]. Thrombocytopenia - stable Frequent Vtach/CAD - metoprolol   HTN - hold home meds due to intermittent hypotension HLD Cerebrovascular disease Hypothyroidism - IV synthroid [place] state -palliative care talking with family. FEN - NPO/IVFs ID - ancef on call to OR if goes to OR with ortho VTE - SCDs, lovenox   Dispo: palliative    LOS: 2 days    Stark Klein 04/05/2021

## 2021-04-20 NOTE — TOC CAGE-AID Note (Signed)
Transition of Care [facility]) - CAGE-AID Screening   Patient Details  Name: NIHAAL FRIESEN MRN: [id number] Date of Birth: 10-02-1929  Transition of Care [facility]) CM/SW Contact:    Precious Bard, RN Phone Number: 04/11/2021, 11:21 AM   Clinical Narrative:  Patient denies any current alcohol or drug use. No need for education or resources at this time.  CAGE-AID Screening:    Have You Ever Felt You Ought to Cut Down on Your Drinking or Drug Use?: No Have People Annoyed You By Critizing Your Drinking Or Drug Use?: No Have You Felt Bad Or Guilty About Your Drinking Or Drug Use?: No Have You Ever Had a Drink or Used Drugs First Thing In The Morning to Steady Your Nerves or to Get Rid of a Hangover?: No CAGE-AID Score: 0  Substance Abuse Education Offered: No

## 2021-04-20 NOTE — Progress Notes (Signed)
Please see Dr Kyla Balzarine original consult note regarding preop clearance. Tele reviewed today, shows sinus tach chronic IVCD, occasioanl PVCs, no significant ventricular arrhythmias by tele, primarily sinus tachy with his underlying conduction delay. Plans to go to OR for hip fracture. Call with questions.   Carlyle Dolly MD

## 2021-04-21 ENCOUNTER — Encounter (HOSPITAL_COMMUNITY): Payer: Self-pay | Admitting: Orthopedic Surgery

## 2021-04-21 DIAGNOSIS — I493 Ventricular premature depolarization: Secondary | ICD-10-CM | POA: Diagnosis not present

## 2021-04-21 DIAGNOSIS — F0390 Unspecified dementia without behavioral disturbance: Secondary | ICD-10-CM | POA: Diagnosis not present

## 2021-04-21 LAB — CBC
HCT: 25.8 % — ABNORMAL LOW (ref 39.0–52.0)
Hemoglobin: 8.3 g/dL — ABNORMAL LOW (ref 13.0–17.0)
MCH: 32 pg (ref 26.0–34.0)
MCHC: 32.2 g/dL (ref 30.0–36.0)
MCV: 99.6 fL (ref 80.0–100.0)
Platelets: 76 10*3/uL — ABNORMAL LOW (ref 150–400)
RBC: 2.59 MIL/uL — ABNORMAL LOW (ref 4.22–5.81)
RDW: 13.9 % (ref 11.5–15.5)
WBC: 7.3 10*3/uL (ref 4.0–10.5)
nRBC: 0.3 % — ABNORMAL HIGH (ref 0.0–0.2)

## 2021-04-21 MED ORDER — OXYCODONE HCL 5 MG PO TABS
2.5000 mg | ORAL_TABLET | ORAL | Status: DC | PRN
  Administered 2021-04-23 – 2021-04-25 (×2): 5 mg via ORAL
  Filled 2021-04-21 (×2): qty 1

## 2021-04-21 MED ORDER — MORPHINE SULFATE (PF) 2 MG/ML IV SOLN
1.0000 mg | INTRAVENOUS | Status: DC | PRN
  Administered 2021-04-21 – 2021-04-23 (×4): 2 mg via INTRAVENOUS
  Administered 2021-04-23 – 2021-04-25 (×2): 1 mg via INTRAVENOUS
  Administered 2021-04-26 (×2): 2 mg via INTRAVENOUS
  Filled 2021-04-21 (×8): qty 1

## 2021-04-21 MED ORDER — ACETAMINOPHEN 500 MG PO TABS
1000.0000 mg | ORAL_TABLET | Freq: Three times a day (TID) | ORAL | Status: DC | PRN
  Administered 2021-04-22: 1000 mg via ORAL
  Filled 2021-04-21: qty 2

## 2021-04-21 NOTE — Progress Notes (Signed)
    Subjective: Patient has dementia and is largely non-verbal. Tolerating diet. Urinating. Has not worked with PT on mobilizing OOB. Unsure of his ability to do so.  Objective:   VITALS:   Vitals:   04/21/21 0135 04/21/21 0348 04/21/21 0557 04/21/21 0755  BP: (!) 143/47 (!) 150/73 123/72 (!) 146/88  Pulse: (!) 109 (!) 111 (!) 110   Resp: 18 14 17 16   Temp:  99 F (37.2 C)  97.8 F (36.6 C)  TempSrc:  Oral  Oral  SpO2: 99% 99% 100%   Weight:      Height:       CBC Latest Ref Rng & Units 04/21/2021 04/21/2021 04/23/2021  WBC 4.0 - 10.5 K/uL 7.3 5.9 8.3  Hemoglobin 13.0 - 17.0 g/dL 8.3(L) 8.5(L) 9.3(L)  Hematocrit 39.0 - 52.0 % 25.8(L) 26.9(L) 28.1(L)  Platelets 150 - 400 K/uL 76(L) 63(L) 67(L)   BMP Latest Ref Rng & Units 04/23/2021 04/13/2021 04/19/2021  Glucose 70 - 99 mg/dL 146(H) 116(H) 127(H)  BUN 8 - 23 mg/dL 30(H) 34(H) 42(H)  Creatinine 0.61 - 1.24 mg/dL 1.11 1.25(H) 1.45(H)  BUN/Creat Ratio 10 - 24 - - -  Sodium 135 - 145 mmol/L 141 140 140  Potassium 3.5 - 5.1 mmol/L 4.5 4.1 5.0  Chloride 98 - 111 mmol/L 109 110 109  CO2 22 - 32 mmol/L 24 23 26   Calcium 8.9 - 10.3 mg/dL 8.4(L) 8.6(L) 8.5(L)   Intake/Output      10/23 0701 10/24 0700 10/24 0701 10/25 0700   P.O. 0    I.V. (mL/kg) 2045.6 (32.9)    IV Piggyback 850    Total Intake(mL/kg) 2895.6 (46.6)    Urine (mL/kg/hr) 1100 (0.7)    Blood 25    Total Output 1125    Net +1770.6            Physical Exam: General: NAD. Laying flat in bed. Appears very frail. Wearing mittens Resp: No increased wob Cardio: regular rate and rhythm ABD soft Neurologically intact MSK Neurovascularly intact Sensation intact distally, pulls away from painful stimuli Intact pulses distally Dorsiflexion/Plantar flexion intact Moves toes Incision: dressing C/D/I Ecchymosis on thigh   Assessment: 1 Day Post-Op  S/P Procedure(s) (LRB): CANNULATED HIP PINNING (Right) by Dr. Ernesta Amble. Percell Miller on 04/01/2021  Active  Problems:   Closed right hip fracture ([place])   Plan:  Advance diet Up with therapy if able Incentive Spirometry Elevate and Apply ice  Weightbearing: WBAT RLE Insicional and dressing care: Dressings left intact until follow-up and Reinforce dressings as needed Orthopedic device(s): None Showering: Keep dressing dry VTE prophylaxis:  Lovenox 30mg  daily   while inpatient, can switch to ASA 81mg  bid x 30 days upon d/c , SCDs, ambulation Pain control: continue current regimen, minimize narcotics d/t dementia Follow - up plan: 2 weeks post op Contact information:  Edmonia Lynch MD, Aggie Moats PA-C  Dispo:  TBD based on PT evals. Will most likely need SNF .     Britt Bottom, PA-C Office [phone] 04/21/2021, 8:55 AM

## 2021-04-21 NOTE — Progress Notes (Signed)
Progress Note  Patient Name: [name] Date of Encounter: 04/21/2021  Primary Cardiologist: Kirk Ruths, MD   Subjective   Has surgery 04/17/2021.  No Cardiac complications:  over weekend has PVCs but no NSVT.  Patient moans and grunts but presently is not taking pills or eating.    Inpatient Medications    Scheduled Meds:  acetaminophen  500 mg Oral Q6H   Chlorhexidine Gluconate Cloth  6 each Topical Daily   docusate sodium  100 mg Oral BID   enoxaparin (LOVENOX) injection  30 mg Subcutaneous Q24H   levothyroxine  37.5 mcg Intravenous Daily   metoprolol tartrate  5 mg Intravenous Q6H   pantoprazole  40 mg Oral Daily   Or   pantoprazole (PROTONIX) IV  40 mg Intravenous Daily   traMADol  50 mg Oral Q6H   Continuous Infusions:  sodium chloride 50 mL/hr at 04/19/2021 1644   methocarbamol (ROBAXIN) IV     PRN Meds: acetaminophen, alum & mag hydroxide-simeth, bisacodyl, haloperidol lactate, HYDROcodone-acetaminophen, HYDROcodone-acetaminophen, menthol-cetylpyridinium **OR** phenol, methocarbamol **OR** methocarbamol (ROBAXIN) IV, metoCLOPramide **OR** metoCLOPramide (REGLAN) injection, morphine injection, morphine injection, morphine injection, ondansetron **OR** ondansetron (ZOFRAN) IV, polyethylene glycol   Vital Signs    Vitals:   04/21/21 0135 04/21/21 0348 04/21/21 0557 04/21/21 0755  BP: (!) 143/47 (!) 150/73 123/72 (!) 146/88  Pulse: (!) 109 (!) 111 (!) 110   Resp: 18 14 17 16   Temp:  99 F (37.2 C)  97.8 F (36.6 C)  TempSrc:  Oral  Oral  SpO2: 99% 99% 100%   Weight:      Height:        Intake/Output Summary (Last 24 hours) at 04/21/2021 0906 Last data filed at 04/21/2021 0400 Gross per 24 hour  Intake 2895.6 ml  Output 1125 ml  Net 1770.6 ml   Filed Weights   04/15/2021 0138 04/09/2021 1105  Weight: 60 kg 62.1 kg    Telemetry    Sinus rhythm- Sinus tachycardia PVCs with bigeminy and trigeminy right bundleoid QRS - Personally Reviewed  ECG    No  New - Personally Reviewed  Physical Exam   GEN: Elderly mail Neck: No JVD Cardiac: RRR,soft systolic murmur with no rubs, or gallops.  Respiratory: Clear to auscultation bilaterally with limited breath sounds (does not respire on command) GI: Soft, nontender, non-distended  MS: No edema; No deformity. Neuro:  In hand mitts, does not responsed to questions, move all extremities Psych: Altered affect   Labs    Chemistry Recent Labs  Lab 04/19/21 1621 04/08/2021 0426 04/24/2021 2154  NA 140 140 141  K 5.0 4.1 4.5  CL 109 110 109  CO2 26 23 24   GLUCOSE 127* 116* 146*  BUN 42* 34* 30*  CREATININE 1.45* 1.25* 1.11  CALCIUM 8.5* 8.6* 8.4*  PROT 4.7* 5.1* 5.0*  ALBUMIN 2.8* 3.0* 2.9*  AST 42* 57* 46*  ALT 23 28 25   ALKPHOS 25* 29* 24*  BILITOT 1.1 1.2 0.9  GFRNONAA 46* 55* >60  ANIONGAP 5 7 8      Hematology Recent Labs  Lab 04/02/2021 0426 04/01/2021 2154 04/21/21 0302  WBC 8.3 5.9 7.3  RBC 2.83* 2.64* 2.59*  HGB 9.3* 8.5* 8.3*  HCT 28.1* 26.9* 25.8*  MCV 99.3 101.9* 99.6  MCH 32.9 32.2 32.0  MCHC 33.1 31.6 32.2  RDW 14.1 14.2 13.9  PLT 67* 63* 76*    Cardiac EnzymesNo results for input(s): TROPONINI in the last 168 hours. No results for input(s): TROPIPOC  in the last 168 hours.   BNPNo results for input(s): BNP, PROBNP in the last 168 hours.   DDimer No results for input(s): DDIMER in the last 168 hours.   Radiology    Pelvis Portable  Result Date: 04/10/2021 CLINICAL DATA:  Status post ORIF of the RIGHT femur EXAM: PORTABLE PELVIS 1-2 VIEWS COMPARISON:  April 18, 2021. FINDINGS: Status post ORIF of the RIGHT femur. Orthopedic hardware is intact and without periprosthetic fracture or lucency. Near anatomic alignment of the subcapital femur. Revisualization of a comminuted fracture of the LEFT inferior and superior pubic ramus. Revisualization of the mildly displaced fracture of the RIGHT superior pubic rami extending into the acetabulum. Revisualization of a  mildly displaced fracture of the RIGHT inferior pubic ramus. Soft tissue air. Vascular calcifications. IMPRESSION: Expected postsurgical appearance status post ORIF of the RIGHT femur. Electronically Signed   By: Valentino Saxon M.D.   On: 04/25/2021 16:25   DG C-Arm 1-60 Min-No Report  Result Date: 04/11/2021 Fluoroscopy was utilized by the requesting physician.  No radiographic interpretation.   DG HIP OPERATIVE UNILAT WITH PELVIS RIGHT  Result Date: 04/26/2021 CLINICAL DATA:  Hip fracture. EXAM: OPERATIVE RIGHT HIP (WITH PELVIS IF PERFORMED)  VIEWS TECHNIQUE: Fluoroscopic spot image(s) were submitted for interpretation post-operatively. COMPARISON:  Hip radiograph 03/29/2021; CT pelvis 04/15/2021 FINDINGS: Fluoroscopic images were obtained intraoperatively and submitted for post operative interpretation. Three right cannulated screws with hardware in expected position, 1 image was obtained with 50 seconds of fluoroscopy time. Please see the performing provider's procedural report for further detail. Right anterior column acetabular fracture again demonstrated. IMPRESSION: Right hip hardware in appropriate position. Electronically Signed   By: Ileana Roup M.D.   On: 04/03/2021 16:01      Patient Profile     87 y.o. male fall and s/p hip surgery seen 04/25/2021 for Pre-operative risk stratification; course complicated by AMS  Assessment & Plan    PVCs Hx of CAD with prior NSTEMI Hx of Critical AS s/p SAVR 08/3293 complicated by post op AF on amiodarone CAS s/p Prior CEA Dementia- unclear baseline - presently s/p hip surgery and DNR is the only limitation of care, I do have concerns that he has a significant underlying dementia, will plan for conservative plan or care unless GOC change - ASA back when OK post operatively - when taking PO again would retern metoprolol tartrate 25 mg PO BID - zetia 10 mg is reasonable to return - if blood pressure becomes and issues with appropriate pain  control, can add back ramipril 5 mg; otherwise this can be returned as outpatient   [place] will sign off.   Medication Recommendations:  ASA and BB as above when tolerating PO Other recommendations (labs, testing, etc):  NA Follow up as an outpatient:  We can move up outpatient follow up from 09/10/2021   For questions or updates, please contact [place] Please consult [email or web address] for contact info under Cardiology/STEMI.      Signed, Werner Lean, MD  04/21/2021, 9:06 AM

## 2021-04-21 NOTE — Care Management Important Message (Signed)
Important Message  Patient Details  Name: [name] MRN: [id number] Date of Birth: [birth year]   Medicare Important Message Given:  Yes     [name] 04/21/2021, 3:05 PM

## 2021-04-21 NOTE — Evaluation (Signed)
Physical Therapy Evaluation Patient Details Name: [name] MRN: [id number] DOB: Mar 15, 1930 Today's Date: 04/21/2021  History of Present Illness  Patient is a 87 y/o male who presents on 04/05/2021 with right femoral neck fx s/p fall at home, now s/p hip pinning 04/16/2021. PMH includes dementia, CAD, CVA, HTN, aortic stenosis s/p AVR.  Clinical Impression  Patient presents with agitation, post surgical deficits, nonsensical speech and impaired mobility s/p above. Pt not able to provide PLOF due to hx of dementia. Per chart, pt was ambulatory with [facility] and lives with wife at home. Today, pt not able to tolerate any mobility due to increasingly getting more agitated with conversation and stimulus. Pulling off mitts and swinging arms, not able to calm patient with distraction/conversation. Irritated about having restraints donned. RN present and assisted with re-donning mitts. Not sure pt is going to be able to participate/tolerate therapy due to cognitive deficits which are worsened by being in the hospital. Per chart, family would like pt to go to SNF to attempt to maximize independence and mobility and ease burden of care prior to return home. Will follow for further attempts at assessing mobility.       Recommendations for follow up therapy are one component of a multi-disciplinary discharge planning process, led by the attending physician.  Recommendations may be updated based on patient status, additional functional criteria and insurance authorization.  Follow Up Recommendations Skilled nursing-short term rehab (<3 hours/day)    Assistance Recommended at Discharge Frequent or constant Supervision/Assistance  Functional Status Assessment Patient has had a recent decline in their functional status and/or demonstrates limited ability to make significant improvements in function in a reasonable and predictable amount of time  Equipment Recommendations  None recommended by PT    Recommendations for  Other Services       Precautions / Restrictions Precautions Precautions: Fall Restrictions Weight Bearing Restrictions: Yes RLE Weight Bearing: Weight bearing as tolerated      Mobility  Bed Mobility               General bed mobility comments: Deferred due to escalating agitation with conversation, stimulus    Transfers                   General transfer comment: Deferred    Ambulation/Gait                Stairs            Wheelchair Mobility    Modified Rankin (Stroke Patients Only)       Balance                                             Pertinent Vitals/Pain Pain Assessment: PAINAD Breathing: normal Negative Vocalization: occasional moan/groan, low speech, negative/disapproving quality Facial Expression: facial grimacing Body Language: tense, distressed pacing, fidgeting Consolability: no need to console PAINAD Score: 4 Pain Intervention(s): Repositioned    Home Living Family/patient expects to be discharged to:: Skilled nursing facility                        Prior Function Prior Level of Function : Needs assist;Patient poor historian/Family not available             Mobility Comments: Per notes, pt was using [facility] for mobility and assisting with ADLs at baseline.  Hand Dominance        Extremity/Trunk Assessment   Upper Extremity Assessment Upper Extremity Assessment: Defer to OT evaluation (Strong grip and good ROM in BUEs, swinging at therapists, difficulty getting mitt on BUEs)    Lower Extremity Assessment Lower Extremity Assessment: Difficult to assess due to impaired cognition (No movement of LEs noted throughout session. Not able to assess due to agitation.)       Communication   Communication: Expressive difficulties  Cognition Arousal/Alertness: Awake/alert Behavior During Therapy: Agitated Overall Cognitive Status: History of cognitive impairments - at baseline                                  General Comments: Hx of dementia. Upon arrival, pt had pulled off mitt restraint and had the other one half off, talking nonsensical for most of session, "you are going to get a whooping." "get out of here" getting increasingly more agitated with attempts at working with pt, attempted to help wash face, move pillows, remove restraint. Pt began swinging UEs and yelling.        General Comments General comments (skin integrity, edema, etc.): Tachycardia 115 bpm upon arrival.    Exercises     Assessment/Plan    PT Assessment Patient needs continued PT services  PT Problem List Decreased strength;Decreased mobility;Decreased safety awareness;Decreased skin integrity;Decreased cognition;Decreased activity tolerance;Pain;Decreased knowledge of precautions;Decreased range of motion;Decreased coordination       PT Treatment Interventions Patient/family education;Therapeutic activities;Cognitive remediation;Functional mobility training;Balance training;Gait training;Therapeutic exercise;DME instruction    PT Goals (Current goals can be found in the Care Plan section)  Acute Rehab PT Goals Patient Stated Goal: "get out of here!" PT Goal Formulation: Patient unable to participate in goal setting Time For Goal Achievement: 05/05/21 Potential to Achieve Goals: Fair    Frequency Min 3X/week   Barriers to discharge        Co-evaluation               AM-PAC PT "6 Clicks" Mobility  Outcome Measure Help needed turning from your back to your side while in a flat bed without using bedrails?: Total Help needed moving from lying on your back to sitting on the side of a flat bed without using bedrails?: Total Help needed moving to and from a bed to a chair (including a wheelchair)?: Total Help needed standing up from a chair using your arms (e.g., wheelchair or bedside chair)?: Total Help needed to walk in hospital room?: Total Help needed  climbing 3-5 steps with a railing? : Total 6 Click Score: 6    End of Session   Activity Tolerance: Treatment limited secondary to agitation Patient left: in bed;with call bell/phone within reach;with bed alarm set;with restraints reapplied;with nursing/sitter in room;with SCD's reapplied Nurse Communication: Mobility status;Other (comment) (medication to help decrease agitation) PT Visit Diagnosis: Muscle weakness (generalized) (M62.81);Difficulty in walking, not elsewhere classified (R26.2)    Time: 2500-3704 PT Time Calculation (min) (ACUTE ONLY): 17 min   Charges:   PT Evaluation $PT Eval Moderate Complexity: 1 Mod          Marisa Severin, PT, DPT Acute Rehabilitation Services Pager [phone] Office [phone]     Marguarite Arbour A Sabra Heck 04/21/2021, 12:36 PM

## 2021-04-21 NOTE — Anesthesia Postprocedure Evaluation (Signed)
Anesthesia Post Note  Patient: [name]  Procedure(s) Performed: CANNULATED HIP PINNING (Right: Hip)     Patient location during evaluation: PACU Anesthesia Type: General Level of consciousness: awake and patient cooperative Pain management: pain level controlled Vital Signs Assessment: post-procedure vital signs reviewed and stable Respiratory status: spontaneous breathing, nonlabored ventilation, respiratory function stable and patient connected to nasal cannula oxygen Cardiovascular status: blood pressure returned to baseline and stable Postop Assessment: no apparent nausea or vomiting Anesthetic complications: no   No notable events documented.  Last Vitals:  Vitals:   04/21/21 0348 04/21/21 0557  BP: (!) 150/73 123/72  Pulse: (!) 111 (!) 110  Resp: 14 17  Temp: 37.2 C   SpO2: 99% 100%    Last Pain:  Vitals:   04/21/21 0439  TempSrc:   PainSc: Asleep                 Cameren Earnest

## 2021-04-21 NOTE — Progress Notes (Addendum)
1 Day Post-Op  Subjective: CC: Patient received Haldol x 2 this am. He is awake but does not answer to questioning or follow commands. Grunts when asked questions.   Objective: Vital signs in last 24 hours: Temp:  [97.8 F (36.6 C)-99.4 F (37.4 C)] 97.8 F (36.6 C) (10/24 0755) Pulse Rate:  [81-134] 110 (10/24 0557) Resp:  [11-21] 16 (10/24 0755) BP: (93-150)/(47-112) 146/88 (10/24 0755) SpO2:  [90 %-100 %] 100 % (10/24 0557)    Intake/Output from previous day: 10/23 0701 - 10/24 0700 In: 2895.6 [I.V.:2045.6; IV Piggyback:850] Out: 2778 [Urine:1100; Blood:25] Intake/Output this shift: No intake/output data recorded.  PE: Gen:  Awake, sitting up in bed Card:  Tachycardic with regular rhythm Pulm:  CTAB, no W/R/R, effort normal Abd: Soft, ND, NT +BS Ext:  No LE edema  Skin: no rashes noted, warm and dry  Lab Results:  Recent Labs    04/16/2021 2154 04/21/21 0302  WBC 5.9 7.3  HGB 8.5* 8.3*  HCT 26.9* 25.8*  PLT 63* 76*   BMET Recent Labs    04/23/2021 0426 04/22/2021 2154  NA 140 141  K 4.1 4.5  CL 110 109  CO2 23 24  GLUCOSE 116* 146*  BUN 34* 30*  CREATININE 1.25* 1.11  CALCIUM 8.6* 8.4*   PT/INR No results for input(s): LABPROT, INR in the last 72 hours. CMP     Component Value Date/Time   NA 141 04/23/2021 2154   NA 141 08/09/2020 1123   K 4.5 04/09/2021 2154   CL 109 04/14/2021 2154   CO2 24 04/11/2021 2154   GLUCOSE 146 (H) 04/05/2021 2154   BUN 30 (H) 04/13/2021 2154   BUN 25 08/09/2020 1123   CREATININE 1.11 04/13/2021 2154   CALCIUM 8.4 (L) 04/28/2021 2154   PROT 5.0 (L) 03/30/2021 2154   PROT 6.7 08/09/2020 1123   ALBUMIN 2.9 (L) 04/24/2021 2154   ALBUMIN 4.4 08/09/2020 1123   AST 46 (H) 04/07/2021 2154   ALT 25 04/09/2021 2154   ALKPHOS 24 (L) 04/28/2021 2154   BILITOT 0.9 04/12/2021 2154   BILITOT 0.7 08/09/2020 1123   GFRNONAA >60 04/23/2021 2154   GFRAA 65 08/09/2020 1123   Lipase  No results found for:  LIPASE  Studies/Results: Pelvis Portable  Result Date: 04/25/2021 CLINICAL DATA:  Status post ORIF of the RIGHT femur EXAM: PORTABLE PELVIS 1-2 VIEWS COMPARISON:  April 18, 2021. FINDINGS: Status post ORIF of the RIGHT femur. Orthopedic hardware is intact and without periprosthetic fracture or lucency. Near anatomic alignment of the subcapital femur. Revisualization of a comminuted fracture of the LEFT inferior and superior pubic ramus. Revisualization of the mildly displaced fracture of the RIGHT superior pubic rami extending into the acetabulum. Revisualization of a mildly displaced fracture of the RIGHT inferior pubic ramus. Soft tissue air. Vascular calcifications. IMPRESSION: Expected postsurgical appearance status post ORIF of the RIGHT femur. Electronically Signed   By: Valentino Saxon M.D.   On: 04/06/2021 16:25   DG C-Arm 1-60 Min-No Report  Result Date: 04/14/2021 Fluoroscopy was utilized by the requesting physician.  No radiographic interpretation.   DG HIP OPERATIVE UNILAT WITH PELVIS RIGHT  Result Date: 04/19/2021 CLINICAL DATA:  Hip fracture. EXAM: OPERATIVE RIGHT HIP (WITH PELVIS IF PERFORMED)  VIEWS TECHNIQUE: Fluoroscopic spot image(s) were submitted for interpretation post-operatively. COMPARISON:  Hip radiograph 04/25/2021; CT pelvis 04/03/2021 FINDINGS: Fluoroscopic images were obtained intraoperatively and submitted for post operative interpretation. Three right cannulated screws with hardware in expected position,  1 image was obtained with 50 seconds of fluoroscopy time. Please see the performing provider's procedural report for further detail. Right anterior column acetabular fracture again demonstrated. IMPRESSION: Right hip hardware in appropriate position. Electronically Signed   By: Ileana Roup M.D.   On: 04/04/2021 16:01    Anti-infectives: Anti-infectives (From admission, onward)    Start     Dose/Rate Route Frequency Ordered Stop   04/25/2021 2200  ceFAZolin  (ANCEF) IVPB 1 g/50 mL premix        1 g 100 mL/hr over 30 Minutes Intravenous Every 8 hours 04/09/2021 1607 04/21/21 0637   04/10/2021 1057  ceFAZolin (ANCEF) 2-4 GM/100ML-% IVPB       Note to Pharmacy: Rennis Harding   : cabinet override      04/22/2021 1057 04/03/2021 2259   04/19/21 0600  ceFAZolin (ANCEF) IVPB 2g/100 mL premix  Status:  Discontinued        2 g 200 mL/hr over 30 Minutes Intravenous On call to O.R. 04/12/2021 2008 04/11/2021 0559   04/24/2021 1200  ceFAZolin (ANCEF) IVPB 2g/100 mL premix  Status:  Discontinued        2 g 200 mL/hr over 30 Minutes Intravenous On call to O.R. 04/17/2021 0558 04/06/2021 2021        Assessment/Plan GLF Pelvic fracture, R femoral neck fracture - Per Ortho s/p cannulated hip pinning by Dr. Edmonia Lynch on 10/23. WBAT RLE. PT/OT [facility] -  Cards consult for frequent small runs of VTACH R lat flank/hip subcutaneous hematoma - monitor H/H. Received TXA an d 1 unit of PRBCs at [facility]. Thrombocytopenia - stable Frequent Vtach/PVC/hx of CAD - Per Cards - recs as follows - when taking PO again would retern metoprolol tartrate 25 mg PO BID. Zetia 10 mg is reasonable to return. If blood pressure becomes and issues with appropriate pain control, can add back ramipril 5 mg; otherwise this can be returned as outpatient. Cards s/o 10/24. Check EKG for qtc given recent haldol administration.  HTN - hold home meds due to intermittent hypotension. On scheduled IV metoprolol.  HLD Cerebrovascular disease Dementia  Hypothyroidism - IV synthroid Frail state - palliative care talking with family. FEN - NPO/IVFs, SLP eval.  ID - ancef peri-op for Ortho procedure. None currently.  VTE - SCDs, lovenox. Plan to restart ASA as recommended by Cards when taking PO. Will need ASA 81mg  bid x 30 days upon d/c per Ortho  Foley - out Dispo - PT/OT. SNF. Palliative discussions.    LOS: 3 days    Jillyn Ledger , [facility] Surgery 04/21/2021, 11:59 AM Please see  Amion for pager number during day hours 7:00am-4:30pm

## 2021-04-22 ENCOUNTER — Inpatient Hospital Stay (HOSPITAL_COMMUNITY): Payer: Medicare Other

## 2021-04-22 DIAGNOSIS — Z515 Encounter for palliative care: Secondary | ICD-10-CM | POA: Diagnosis not present

## 2021-04-22 DIAGNOSIS — Z7189 Other specified counseling: Secondary | ICD-10-CM | POA: Diagnosis not present

## 2021-04-22 LAB — CBC
HCT: 24.6 % — ABNORMAL LOW (ref 39.0–52.0)
Hemoglobin: 8 g/dL — ABNORMAL LOW (ref 13.0–17.0)
MCH: 32.7 pg (ref 26.0–34.0)
MCHC: 32.5 g/dL (ref 30.0–36.0)
MCV: 100.4 fL — ABNORMAL HIGH (ref 80.0–100.0)
Platelets: 93 10*3/uL — ABNORMAL LOW (ref 150–400)
RBC: 2.45 MIL/uL — ABNORMAL LOW (ref 4.22–5.81)
RDW: 14.1 % (ref 11.5–15.5)
WBC: 7.9 10*3/uL (ref 4.0–10.5)
nRBC: 0 % (ref 0.0–0.2)

## 2021-04-22 MED ORDER — ORAL CARE MOUTH RINSE
15.0000 mL | Freq: Two times a day (BID) | OROMUCOSAL | Status: DC
  Administered 2021-04-22 – 2021-04-25 (×7): 15 mL via OROMUCOSAL

## 2021-04-22 MED ORDER — CHLORHEXIDINE GLUCONATE 0.12 % MT SOLN
15.0000 mL | Freq: Two times a day (BID) | OROMUCOSAL | Status: DC
  Administered 2021-04-22 – 2021-04-27 (×11): 15 mL via OROMUCOSAL
  Filled 2021-04-22 (×8): qty 15

## 2021-04-22 MED ORDER — WHITE PETROLATUM EX OINT
TOPICAL_OINTMENT | CUTANEOUS | Status: AC
  Filled 2021-04-22: qty 28.35

## 2021-04-22 MED ORDER — BUSPIRONE HCL 5 MG PO TABS
5.0000 mg | ORAL_TABLET | Freq: Two times a day (BID) | ORAL | Status: DC
  Administered 2021-04-22 – 2021-04-25 (×7): 5 mg via ORAL
  Filled 2021-04-22 (×7): qty 1

## 2021-04-22 NOTE — Progress Notes (Addendum)
2 Days Post-Op  Subjective: CC: Patient is awake and alert, moving all extremities but does not answer questions (nonsensical words and grunts) and intermittently follows commands. No acute changes overnight.   Objective: Vital signs in last 24 hours: Temp:  [97.9 F (36.6 C)-98.8 F (37.1 C)] 98.6 F (37 C) (10/25 0736) Pulse Rate:  [117-122] (P) 120 (10/25 0500) Resp:  [17-20] 20 (10/25 0736) BP: (94-132)/(58-66) 94/66 (10/25 0736) SpO2:  [95 %-98 %] 98 % (10/25 0736) Last BM Date: 04/19/2021  Intake/Output from previous day: 10/24 0701 - 10/25 0700 In: 240 [P.O.:240] Out: -  Intake/Output this shift: No intake/output data recorded.  PE: Gen:  Awake, sitting up in bed Card:  Tachycardic with irregular rhythm Pulm:  CTAB, no W/R/R, effort normal Abd: Soft, ND, NT +BS Ext:  No LE edema. MAEs Skin: no rashes noted, warm and dry  Lab Results:  Recent Labs    04/21/21 0302 04/22/21 0256  WBC 7.3 7.9  HGB 8.3* 8.0*  HCT 25.8* 24.6*  PLT 76* 93*   BMET Recent Labs    04/14/2021 0426 04/09/2021 2154  NA 140 141  K 4.1 4.5  CL 110 109  CO2 23 24  GLUCOSE 116* 146*  BUN 34* 30*  CREATININE 1.25* 1.11  CALCIUM 8.6* 8.4*   PT/INR No results for input(s): LABPROT, INR in the last 72 hours. CMP     Component Value Date/Time   NA 141 04/17/2021 2154   NA 141 08/09/2020 1123   K 4.5 04/15/2021 2154   CL 109 04/15/2021 2154   CO2 24 04/09/2021 2154   GLUCOSE 146 (H) 04/11/2021 2154   BUN 30 (H) 04/03/2021 2154   BUN 25 08/09/2020 1123   CREATININE 1.11 04/17/2021 2154   CALCIUM 8.4 (L) 03/30/2021 2154   PROT 5.0 (L) 04/08/2021 2154   PROT 6.7 08/09/2020 1123   ALBUMIN 2.9 (L) 04/09/2021 2154   ALBUMIN 4.4 08/09/2020 1123   AST 46 (H) 04/03/2021 2154   ALT 25 03/30/2021 2154   ALKPHOS 24 (L) 04/12/2021 2154   BILITOT 0.9 04/19/2021 2154   BILITOT 0.7 08/09/2020 1123   GFRNONAA >60 04/19/2021 2154   GFRAA 65 08/09/2020 1123   Lipase  No results found  for: LIPASE  Studies/Results: Pelvis Portable  Result Date: 04/18/2021 CLINICAL DATA:  Status post ORIF of the RIGHT femur EXAM: PORTABLE PELVIS 1-2 VIEWS COMPARISON:  April 18, 2021. FINDINGS: Status post ORIF of the RIGHT femur. Orthopedic hardware is intact and without periprosthetic fracture or lucency. Near anatomic alignment of the subcapital femur. Revisualization of a comminuted fracture of the LEFT inferior and superior pubic ramus. Revisualization of the mildly displaced fracture of the RIGHT superior pubic rami extending into the acetabulum. Revisualization of a mildly displaced fracture of the RIGHT inferior pubic ramus. Soft tissue air. Vascular calcifications. IMPRESSION: Expected postsurgical appearance status post ORIF of the RIGHT femur. Electronically Signed   By: Valentino Saxon M.D.   On: 03/30/2021 16:25   DG C-Arm 1-60 Min-No Report  Result Date: 04/17/2021 Fluoroscopy was utilized by the requesting physician.  No radiographic interpretation.   DG HIP OPERATIVE UNILAT WITH PELVIS RIGHT  Result Date: 04/02/2021 CLINICAL DATA:  Hip fracture. EXAM: OPERATIVE RIGHT HIP (WITH PELVIS IF PERFORMED)  VIEWS TECHNIQUE: Fluoroscopic spot image(s) were submitted for interpretation post-operatively. COMPARISON:  Hip radiograph 03/31/2021; CT pelvis 04/24/2021 FINDINGS: Fluoroscopic images were obtained intraoperatively and submitted for post operative interpretation. Three right cannulated screws with hardware in  expected position, 1 image was obtained with 50 seconds of fluoroscopy time. Please see the performing provider's procedural report for further detail. Right anterior column acetabular fracture again demonstrated. IMPRESSION: Right hip hardware in appropriate position. Electronically Signed   By: Ileana Roup M.D.   On: 04/23/2021 16:01    Anti-infectives: Anti-infectives (From admission, onward)    Start     Dose/Rate Route Frequency Ordered Stop   04/23/2021 2200   ceFAZolin (ANCEF) IVPB 1 g/50 mL premix        1 g 100 mL/hr over 30 Minutes Intravenous Every 8 hours 04/04/2021 1607 04/22/21 0758   04/17/2021 1057  ceFAZolin (ANCEF) 2-4 GM/100ML-% IVPB       Note to Pharmacy: Rennis Harding   : cabinet override      04/01/2021 1057 04/12/2021 2259   04/19/21 0600  ceFAZolin (ANCEF) IVPB 2g/100 mL premix  Status:  Discontinued        2 g 200 mL/hr over 30 Minutes Intravenous On call to O.R. 03/29/2021 2008 04/16/2021 0559   04/17/2021 1200  ceFAZolin (ANCEF) IVPB 2g/100 mL premix  Status:  Discontinued        2 g 200 mL/hr over 30 Minutes Intravenous On call to O.R. 04/01/2021 0558 04/15/2021 2021        Assessment/Plan GLF Pelvic fracture, R femoral neck fracture - Per Ortho s/p cannulated hip pinning by Dr. Edmonia Lynch on 10/23. WBAT RLE. PT/OT [facility] -  Cards consult for frequent small runs of VTACH R lat flank/hip subcutaneous hematoma - hgb stable. Received TXA an d 1 unit of PRBCs at [facility]. Thrombocytopenia - improving. Plt 93 Frequent Vtach/PVC/hx of CAD - Per Cards - recs as follows - when taking PO again would retern metoprolol tartrate 25 mg PO BID. Zetia 10 mg is reasonable to return. If blood pressure becomes and issues with appropriate pain control, can add back ramipril 5 mg; otherwise this can be returned as outpatient. Cards s/o 10/24.  HTN - hold home meds due to intermittent hypotension. On scheduled IV metoprolol.  HLD Cerebrovascular disease Dementia  Hypothyroidism - IV synthroid Frail state - palliative care talking with family. DNR FEN - NPO/IVFs, SLP eval (doing swallow today) ID - ancef peri-op for Ortho procedure. None currently.  VTE - SCDs, lovenox. Plan to restart ASA as recommended by Cards when taking PO. Will need ASA 81mg  bid x 30 days upon d/c per Ortho.  Foley - out Dispo - PT/OT. SNF. Palliative discussions. Discussed with family today.    LOS: 4 days    Jillyn Ledger , [facility] Surgery 04/22/2021,  9:22 AM Please see Amion for pager number during day hours 7:00am-4:30pm

## 2021-04-22 NOTE — Discharge Instructions (Signed)
Weightbearing: Weight bearing as tolerated to the right lower extremity Insicional and dressing care: Dressings to be left intact until follow-up and Reinforce dressings as needed Orthopedic device(s): None Showering: Keep dressing dry VTE prophylaxis:  Please taken ASA 81mg  bid x 30 days upon d/c  Follow - up plan: Please follow up with Ortho 2 weeks post op

## 2021-04-22 NOTE — Progress Notes (Signed)
Modified Barium Swallow Progress Note  Patient Details  Name: [name] MRN: [id number] Date of Birth: [birth year]  Today's Date: 04/22/2021  Modified Barium Swallow completed.  Full report located under Chart Review in the Imaging Section.  Brief recommendations include the following:  Clinical Impression  Pt was seen for an MBS. Overall, pt presents with severe dysphagia secondary to decreased strength and coordination of oral and pharyngeal phases of swallowing. Across consistencies, pt exhibited decreased bolus cohesion, delayed oral transit, and weak lingual manipulation with impaired mastication of solids, lingual pumping with puree, and piecemeal swallowing of honey thick liquid. Pharyngeal phase deficits c/b reduced base of tongue retraction, reduced anterior laryngeal excursion, reduced epiglottic inversion, reduced airway closure and reduced laryngeal elevation resulting in residue in the vallecula, pyriform sinuses, posterior pharyngeal wall, and cricopharyngeal segment across consistencies. Liquids attempted to enter his nasal cavity intermittently due to questionable incomplete velopharyngeal closue. Reduced airway closure resulted in aspiration that did not clear despite attempt (PAS 7) with thin liquid and penetration above the level of the vocal folds that did not clear (PAS 3) with nectar thick. Pt exhibited no penetration/aspiration with honey thick, however residue was increased. Recommend Dys2/nectar thick liquid diet to aid in bolus preparation with solids and mitigate chances of aspiration with liquids although aspiration risk is high with all consistencies. SLP will follow for management of diet tolerance, upgraded PO trails and agree with palliative care consult as swallow is likely to deteriorate. Suspect pt's swallow function may have been weakened prior and now unable to compensate with this acute illness.   Swallow Evaluation Recommendations       SLP Diet  Recommendations: Dysphagia 2 (Fine chop) solids;Nectar thick liquid   Liquid Administration via: Cup   Medication Administration: Crushed with puree   Supervision: Full assist for feeding;Full supervision/cueing for compensatory strategies   Compensations: Minimize environmental distractions;Slow rate;Small sips/bites   Postural Changes: Remain semi-upright after after feeds/meals (Comment);Seated upright at 90 degrees   Oral Care Recommendations: Oral care BID   Other Recommendations: Order thickener from pharmacy    [name] 04/22/2021,4:53 PM  Orbie Pyo Colvin Caroli.Ed Risk analyst [phone] Office [phone]

## 2021-04-22 NOTE — Progress Notes (Signed)
   Palliative Medicine Inpatient Follow Up Note  Consulting Provider: Georganna Skeans, MD   Reason for consult:   [place] Palliative Medicine Consult  Reason for Consult? 87yo DNR, hip and pelvic FXs, cardiac HX, medical non-compliance    HPI:  Per intake H&P --> 87 yo male with medical history significant for HTN, hyperlipidemia, CAD, cerebrovascular disease who presented to [facility] ED via EMS after suffering a ground level fall at home. S/P right hip pinning. The Palliative Care team was asked to get involved to further address goals of care in the setting of his frail state and chronic noncompliance.   Today's Discussion (04/22/2021):  *Please note that this is a verbal dictation therefore any spelling or grammatical errors are due to the "[place] [employer]" system interpretation.  Chart reviewed. Patient is having a complicated post-op delirium which appears to be inhibiting his ability to work with PT/OT.  I met with [name] at bedside this morning. He has mittens in place. He appears generally unwell.  I spoke to patients RN, [name] today he shares that [name] responds better when you call him by name. He has had some delirium today though they got him an activity apron which has been keeping his hands busy. We reviewed delirium precautions and that to may take some time. WE discussed low dose Buspar to aid in delirium management.   I have called patients spouse, [name] and niece [name] to discuss [name]'s present state. I left a voicemail though will follow up to verify when they next plan on visiting.   Questions and concerns addressed   Objective Assessment: Vital Signs Vitals:   04/22/21 1140 04/22/21 1141  BP: 107/88 107/88  Pulse: (!) 138 (!) 138  Resp: 18 16  Temp: 98.4 F (36.9 C)   SpO2: 99% 97%    Intake/Output Summary (Last 24 hours) at 04/22/2021 1355 Last data filed at 04/22/2021 0700 Gross per 24 hour  Intake 0 ml  Output --  Net 0 ml    Last Weight  Most recent update: 04/13/2021 11:14 AM    Weight  62.1 kg (137 lb)            Gen: Frail elderly male HEENT: moist mucous membranes CV: Regular rate and rhythm PULM: clear to auscultation bilaterally ABD: soft/nontender EXT: No edema, ecchymosis noted on bilateral upper extremities Neuro: Disoriented  SUMMARY OF RECOMMENDATIONS   DNAR/DNI   Treat what is treatable   Goals are for strengthening at rehabilitation though this may not be complicated in the setting of a complicated post-operative delirium   An open and honest conversation was held regarding best case worst-case scenarios in terms of the rehabilitation journey   Delirium precautions - Will initiate Buspar $RemoveBeforeD'5mg'aktBmnxiOAVPum$  PO BID   Appreciate transitions of care setting up outpatient palliative support   Ongoing palliative support incrementally   Time Spent: 25 Greater than 50% of the time was spent in counseling and coordination of care _________________________________________________________________________________ [place] Team Team Cell Phone: [phone] Please utilize secure chat with additional questions, if there is no response within 30 minutes please call the above phone number  Palliative Medicine Team providers are available by phone from 7am to 7pm daily and can be reached through the team cell phone.  Should this patient require assistance outside of these hours, please call the patient's attending physician.

## 2021-04-22 NOTE — Evaluation (Addendum)
Clinical/Bedside Swallow Evaluation Patient Details  Name: [name] MRN: [id number] Date of Birth: May 17, 1930  Today's Date: 04/22/2021 Time: SLP Start Time (ACUTE ONLY): 8099 SLP Stop Time (ACUTE ONLY): 0945 SLP Time Calculation (min) (ACUTE ONLY): 21 min  Past Medical History:  Past Medical History:  Diagnosis Date   Aortic stenosis    a. s/p tissue AVR 08/2013;  b. Echo (09/2013):  Mild LVH, EF 55-60%, no RWMA, Gr 1 DD, AVR ok (mean gradient 9 mmHg), mild MR, mild LAE, mildly reduced RVSF, mild TR, PASP 32 mmHg.   CAD (coronary artery disease)    Cerebrovascular disease, unspecified    Dementia (HCC)    Glaucoma    Bilateral eyes   HLD (hyperlipidemia)    HOH (hard of hearing)    wears bilateral hearing aids   HTN (hypertension)    Pneumonia 07/23/2013   S/P aortic valve replacement with bioprosthetic valve 09/13/2013   25 mm [facility] Ease bovine bioprosthetic tissue valve   Skin cancer of face    removed from nose   Past Surgical History:  Past Surgical History:  Procedure Laterality Date   AORTIC VALVE REPLACEMENT N/A 09/13/2013   Procedure: AORTIC VALVE REPLACEMENT (AVR);  Surgeon: Rexene Alberts, MD;  Location: [place];  Service: Open Heart Surgery;  Laterality: N/A;   CARDIAC CATHETERIZATION     no PCI   CAROTID ENDARTERECTOMY Right    CATARACT EXTRACTION Bilateral    COLONOSCOPY W/ POLYPECTOMY     EYE SURGERY     HIP PINNING,CANNULATED Right 03/31/2021   Procedure: CANNULATED HIP PINNING;  Surgeon: Renette Butters, MD;  Location: [place];  Service: Orthopedics;  Laterality: Right;   INTRAOPERATIVE TRANSESOPHAGEAL ECHOCARDIOGRAM N/A 09/13/2013   Procedure: INTRAOPERATIVE TRANSESOPHAGEAL ECHOCARDIOGRAM;  Surgeon: Rexene Alberts, MD;  Location: [place];  Service: Open Heart Surgery;  Laterality: N/A;   LEFT AND RIGHT HEART CATHETERIZATION WITH CORONARY ANGIOGRAM N/A 07/26/2013   Procedure: LEFT AND RIGHT HEART CATHETERIZATION WITH CORONARY ANGIOGRAM;  Surgeon: Sinclair Grooms, MD;  Location: [facility] CATH LAB;  Service: Cardiovascular;  Laterality: N/A;   RETINAL DETACHMENT SURGERY     HPI:  Patient is a 87 y/o male who presents on 04/28/2021 with right femoral neck fx s/p fall at home, now s/p hip pinning 03/29/2021. PMH includes dementia, CAD, CVA, HTN, aortic stenosis s/p AVR. He eats softer foods per wife. She denies difficulty swallowing before admission.    Assessment / Plan / Recommendation  Clinical Impression  Pt demonstrated indications of inadequate airway protection and incoordinated swallow marked by multiple, audible swallows, immediate and delayed coughs following thin liquids. Puree trial was unremarkable. He was adequately awake and participatory during evaluation. Given CVA, history of dementia and clinical presentation with suspected penetration and/or aspiration, recommend continue NPO with MBS today at 1:30. SLP Visit Diagnosis: Dysphagia, unspecified (R13.10)    Aspiration Risk  Moderate aspiration risk    Diet Recommendation NPO        Other  Recommendations Oral Care Recommendations: Oral care QID    Recommendations for follow up therapy are one component of a multi-disciplinary discharge planning process, led by the attending physician.  Recommendations may be updated based on patient status, additional functional criteria and insurance authorization.  Follow up Recommendations  (TBD)      Frequency and Duration            Prognosis        Swallow Study   General Date  of Onset: 03/29/2021 HPI: Patient is a 87 y/o male who presents on 04/22/2021 with right femoral neck fx s/p fall at home, now s/p hip pinning 04/08/2021. PMH includes dementia, CAD, CVA, HTN, aortic stenosis s/p AVR. He eats softer foods per wife. She denies difficulty swallowing before admission. Type of Study: Bedside Swallow Evaluation Previous Swallow Assessment:  (no) Diet Prior to this Study: NPO Temperature Spikes Noted: No Respiratory Status: Room  air History of Recent Intubation: Yes Length of Intubations (days):  (during surgery) Date extubated: 04/17/2021 Behavior/Cognition: Alert;Distractible;Requires cueing;Confused Oral Cavity Assessment: Dried secretions;Dry Oral Care Completed by SLP: Yes Oral Cavity - Dentition: Missing dentition Self-Feeding Abilities: Needs assist Patient Positioning: Upright in bed Baseline Vocal Quality: Hoarse Volitional Cough: Cognitively unable to elicit Volitional Swallow: Able to elicit    Oral/Motor/Sensory Function Overall Oral Motor/Sensory Function: Mild impairment   Ice Chips Ice chips: Not tested   Thin Liquid Thin Liquid: Impaired Presentation: Cup Oral Phase Impairments: Reduced labial seal Pharyngeal  Phase Impairments: Multiple swallows;Cough - Immediate;Cough - Delayed    Nectar Thick Nectar Thick Liquid: Not tested   Honey Thick Honey Thick Liquid: Not tested   Puree Puree: Within functional limits   Solid     Solid: Not tested      [name] 04/22/2021,1:30 PM

## 2021-04-22 NOTE — Progress Notes (Signed)
    Subjective: Patient has dementia and is largely non-verbal. Sleeping in bed. Urinating. Has not worked with PT on mobilizing OOB due to cognitive level making it too difficult.   Objective:   VITALS:   Vitals:   04/22/21 0000 04/22/21 0400 04/22/21 0500 04/22/21 0736  BP: 132/62 104/65  94/66  Pulse: (!) 119 (!) 122 (!) (P) 120   Resp:  19  20  Temp: 98 F (36.7 C) 98.8 F (37.1 C)  98.6 F (37 C)  TempSrc: Axillary Axillary  Oral  SpO2: 96% 98%  98%  Weight:      Height:       CBC Latest Ref Rng & Units 04/22/2021 04/21/2021 03/29/2021  WBC 4.0 - 10.5 K/uL 7.9 7.3 5.9  Hemoglobin 13.0 - 17.0 g/dL 8.0(L) 8.3(L) 8.5(L)  Hematocrit 39.0 - 52.0 % 24.6(L) 25.8(L) 26.9(L)  Platelets 150 - 400 K/uL 93(L) 76(L) 63(L)   BMP Latest Ref Rng & Units 04/20/2021 04/28/2021 04/19/2021  Glucose 70 - 99 mg/dL 146(H) 116(H) 127(H)  BUN 8 - 23 mg/dL 30(H) 34(H) 42(H)  Creatinine 0.61 - 1.24 mg/dL 1.11 1.25(H) 1.45(H)  BUN/Creat Ratio 10 - 24 - - -  Sodium 135 - 145 mmol/L 141 140 140  Potassium 3.5 - 5.1 mmol/L 4.5 4.1 5.0  Chloride 98 - 111 mmol/L 109 110 109  CO2 22 - 32 mmol/L 24 23 26   Calcium 8.9 - 10.3 mg/dL 8.4(L) 8.6(L) 8.5(L)   Intake/Output      10/24 0701 10/25 0700 10/25 0701 10/26 0700   P.O. 240    I.V. (mL/kg)     IV Piggyback     Total Intake(mL/kg) 240 (3.9)    Urine (mL/kg/hr)     Blood     Total Output     Net +240         Urine Occurrence 2 x       Physical Exam: General: NAD. Laying flat in bed. Appears very frail. Wearing mittens Resp: No increased wob Cardio: regular rate and rhythm ABD soft Neurologically intact MSK Neurovascularly intact Sensation intact distally, pulls away from painful stimuli Intact pulses distally Dorsiflexion/Plantar flexion intact Moves toes Incision: dressing C/D/I Ecchymosis on thigh   Assessment: 2 Days Post-Op  S/P Procedure(s) (LRB): CANNULATED HIP PINNING (Right) by Dr. Ernesta Amble. Percell Miller on  04/28/2021  Active Problems:   Closed right hip fracture ([place])   Plan:  Advance diet Up with therapy if able Incentive Spirometry Elevate and Apply ice  Weightbearing: WBAT RLE Insicional and dressing care: Dressings left intact until follow-up and Reinforce dressings as needed Orthopedic device(s): None Showering: Keep dressing dry VTE prophylaxis:  Lovenox 30mg  daily   while inpatient, can switch to ASA 81mg  bid x 30 days upon d/c , SCDs, ambulation Pain control: continue current regimen, minimize narcotics d/t dementia Follow - up plan: 2 weeks post op Contact information:  Edmonia Lynch MD, Aggie Moats PA-C  Dispo:  TBD based on PT evals. Will most likely need SNF . Prognosis for ability to return home not good. He barely has any meaningful interaction with staff.     Britt Bottom, PA-C Office [phone] 04/22/2021, 9:39 AM

## 2021-04-23 DIAGNOSIS — Z515 Encounter for palliative care: Secondary | ICD-10-CM | POA: Diagnosis not present

## 2021-04-23 DIAGNOSIS — Z7189 Other specified counseling: Secondary | ICD-10-CM | POA: Diagnosis not present

## 2021-04-23 LAB — TYPE AND SCREEN
ABO/RH(D): A NEG
Antibody Screen: NEGATIVE
Unit division: 0
Unit division: 0

## 2021-04-23 LAB — BPAM RBC
Blood Product Expiration Date: 202211112359
Blood Product Expiration Date: 202211132359
ISSUE DATE / TIME: 202210210814
Unit Type and Rh: 600
Unit Type and Rh: 600

## 2021-04-23 LAB — CBC
HCT: 27.9 % — ABNORMAL LOW (ref 39.0–52.0)
Hemoglobin: 9 g/dL — ABNORMAL LOW (ref 13.0–17.0)
MCH: 32.3 pg (ref 26.0–34.0)
MCHC: 32.3 g/dL (ref 30.0–36.0)
MCV: 100 fL (ref 80.0–100.0)
Platelets: 125 10*3/uL — ABNORMAL LOW (ref 150–400)
RBC: 2.79 MIL/uL — ABNORMAL LOW (ref 4.22–5.81)
RDW: 14.5 % (ref 11.5–15.5)
WBC: 7.7 10*3/uL (ref 4.0–10.5)
nRBC: 0.6 % — ABNORMAL HIGH (ref 0.0–0.2)

## 2021-04-23 MED ORDER — METOPROLOL TARTRATE 25 MG PO TABS
25.0000 mg | ORAL_TABLET | Freq: Two times a day (BID) | ORAL | Status: DC
  Administered 2021-04-23 – 2021-04-25 (×5): 25 mg via ORAL
  Filled 2021-04-23 (×5): qty 1

## 2021-04-23 MED ORDER — POLYETHYLENE GLYCOL 3350 17 G PO PACK
17.0000 g | PACK | Freq: Every day | ORAL | Status: DC
  Administered 2021-04-23 – 2021-04-25 (×3): 17 g via ORAL
  Filled 2021-04-23 (×3): qty 1

## 2021-04-23 MED ORDER — METOPROLOL TARTRATE 5 MG/5ML IV SOLN: 2.5000 mg | Freq: Once | INTRAVENOUS | Status: DC

## 2021-04-23 MED ORDER — LEVOTHYROXINE SODIUM 75 MCG PO TABS
75.0000 ug | ORAL_TABLET | Freq: Every day | ORAL | Status: DC
  Administered 2021-04-24 – 2021-04-25 (×2): 75 ug via ORAL
  Filled 2021-04-23 (×2): qty 1

## 2021-04-23 MED ORDER — METOPROLOL TARTRATE 5 MG/5ML IV SOLN
5.0000 mg | Freq: Four times a day (QID) | INTRAVENOUS | Status: DC | PRN
  Administered 2021-04-24 – 2021-04-25 (×4): 5 mg via INTRAVENOUS
  Filled 2021-04-23 (×5): qty 5

## 2021-04-23 MED ORDER — ASPIRIN EC 81 MG PO TBEC
81.0000 mg | DELAYED_RELEASE_TABLET | Freq: Every day | ORAL | Status: DC
  Administered 2021-04-23: 81 mg via ORAL
  Filled 2021-04-23: qty 1

## 2021-04-23 MED ORDER — METOPROLOL TARTRATE 5 MG/5ML IV SOLN
5.0000 mg | Freq: Once | INTRAVENOUS | Status: AC
  Administered 2021-04-23: 5 mg via INTRAVENOUS
  Filled 2021-04-23: qty 5

## 2021-04-23 NOTE — Progress Notes (Signed)
Patient restless with HR sustained in 140-150's. Administered pain medication with no effect. Notified MD with new orders for metoprolol 5mg  IV. Will continue to monitor.

## 2021-04-23 NOTE — Progress Notes (Signed)
Palliative Medicine Inpatient Follow Up Note  Consulting Provider: Georganna Skeans, MD   Reason for consult:   [name] Palliative Medicine Consult  Reason for Consult? 87yo DNR, hip and pelvic FXs, cardiac HX, medical non-compliance    HPI:  Per intake H&P --> 87 yo male with medical history significant for HTN, hyperlipidemia, CAD, cerebrovascular disease who presented to [facility] ED via EMS after suffering a ground level fall at home. S/P right hip pinning. The Palliative Care team was asked to get involved to further address goals of care in the setting of his frail state and chronic noncompliance.   Today's Discussion (04/23/2021):  *Please note that this is a verbal dictation therefore any spelling or grammatical errors are due to the "[place] [employer]" system interpretation.  Chart reviewed.   I met with [name] at bedside this morning. He is altered though in NAD. He has mittens on his hands and wrist restraints in place.   I called patients niece, [name] and provided her a brief update as to how [name] was doing. She shared that [name] and another niece, Arrie Aran will be by this afternoon.   I went to bedside this afternoon and met with [name] and [name]. [name] shares that [name] from her perspective, [name] is not doing terribly well. We discussed his poor overall clinical state and the complications associated with him in the setting of delirium. We reviewed that he is likely not going to do terribly well if his delirium continued. We reviewed that this will preclude the ability to continue with PT/OT mainly in the setting of inability to follow direction.  I shared that per nutritional review and discussion with the RN, patient is eating very little. We discussed that this could also be a complication of delirium which may improve over time but also could continue.   We discussed continued care for the time being though if further decline occurs to discuss options such  as hospice care.  I described hospice as a service for patients who have a life expectancy of < 6 months. It preserves dignity and quality at the end phases of life. The focus changes from curative to symptom relief. Patients wife understands and would not be able to care for [name] at home if things evolve this way. She lives in [place] which would thus need to be the hospice of choice - inpatient.   Questions and concerns addressed   Objective Assessment: Vital Signs Vitals:   04/23/21 0730 04/23/21 1130  BP: 107/79 107/71  Pulse: (!) 137 (!) 131  Resp: 15 14  Temp: (!) 97.3 F (36.3 C) 98.6 F (37 C)  SpO2: 100% 100%    Intake/Output Summary (Last 24 hours) at 04/23/2021 1328 Last data filed at 04/22/2021 1700 Gross per 24 hour  Intake 0 ml  Output --  Net 0 ml    Last Weight  Most recent update: 04/05/2021 11:14 AM    Weight  62.1 kg (137 lb)            Gen: Frail elderly male HEENT: moist mucous membranes CV: Regular rate and rhythm PULM: clear to auscultation bilaterally ABD: soft/nontender EXT: No edema, ecchymosis noted on bilateral upper extremities Neuro: Disoriented  SUMMARY OF RECOMMENDATIONS   DNAR/DNI   Treat what is treatable  Watchful waiting to see if improvements can be made   An open and honest conversation was held regarding best case worst-case scenarios in terms of the rehabilitation journey -  if unable to transition to rehabilitation d/t inability to follow direction w. PT/OT may need to focus on comfort and hospice care. This is a statement in the setting of patient lack of PO's and ongoing state of delirium.    Delirium precautions - Will initiate Buspar 41m PO BID    Ongoing palliative support  Time Spent:  35 Greater than 50% of the time was spent in counseling and coordination of care _________________________________________________________________________________ [place] Team Team Cell Phone: [phone] utilize secure chat with additional questions, if there is no response within 30 minutes please call the above phone number  Palliative Medicine Team providers are available by phone from 7am to 7pm daily and can be reached through the team cell phone.  Should this patient require assistance outside of these hours, please call the patient's attending physician.

## 2021-04-23 NOTE — Progress Notes (Signed)
Speech Language Pathology Treatment: Dysphagia  Patient Details Name: [name] MRN: [id number] DOB: [birth year] Today's Date: 04/23/2021 Time: 3235-5732 SLP Time Calculation (min) (ACUTE ONLY): 9 min  Assessment / Plan / Recommendation Clinical Impression   Pt seen for therapy targeting swallowing goals. Overall, pt appears less participatory and alert than previous session with open mouth posture and reduced response to stimuli/questions. RN reports pt experiences difficulty initiating swallow of puree with medications; swishes puree around in mouth before spitting. RN placed medications in nectar thick liquid with better reception, however pt intake still limited. SLP educated family re: results of the MBS, risk of aspiration across consistencies, and selection of diet for decreased but not eliminated risk of aspiration. Family demonstrated understanding that pt is at risk for aspiration across all consistencies. Pt lethargic and therefore not wanting POs this date. Recommend continue with Dys2/nectar thick liquid. SLP will continue to follow for management of diet tolerance.    HPI HPI: Patient is a 87 y/o male who presents on 04/23/2021 with right femoral neck fx s/p fall at home, now s/p hip pinning 04/03/2021. PMH includes dementia, CAD, CVA, HTN, aortic stenosis s/p AVR. He eats softer foods per wife. She denies difficulty swallowing before admission.      SLP Plan  Continue with current plan of care      Recommendations for follow up therapy are one component of a multi-disciplinary discharge planning process, led by the attending physician.  Recommendations may be updated based on patient status, additional functional criteria and insurance authorization.    Recommendations  Diet recommendations: Nectar-thick liquid;Dysphagia 1 (puree) Liquids provided via: Cup Medication Administration: Crushed with puree Supervision: Full supervision/cueing for compensatory  strategies Compensations: Minimize environmental distractions;Slow rate;Small sips/bites Postural Changes and/or Swallow Maneuvers: Seated upright 90 degrees;Upright 30-60 min after meal                Oral Care Recommendations: Oral care QID Follow up Recommendations: 24 hour supervision/assistance SLP Visit Diagnosis: Dysphagia, oral phase (R13.11);Dysphagia, pharyngeal phase (R13.13);Dysphagia, pharyngoesophageal phase (R13.14) Plan: Continue with current plan of care       GO               [name], SLP-Student  [name]  04/23/2021, 3:26 PM

## 2021-04-23 NOTE — Progress Notes (Addendum)
3 Days Post-Op  Subjective: CC: Patient agitated this morning in soft wrist restraints. Tracks me when I come in the room but does not answer questions and grunts or responds with nonsensical words/sentences. No UOP documented but appears he has gone this morning and needs his diaper changed. Still requiring Haldol intermittently.   SLP recommended Dys2/nectar thick liquid diet. RN reports that he is taking meds with applesauce but unclear if any other PO intake.   Palliative having ongoing discussions. They started Buspar 5mg  PO BID  Objective: Vital signs in last 24 hours: Temp:  [97.3 F (36.3 C)-98.7 F (37.1 C)] 97.3 F (36.3 C) (10/26 0730) Pulse Rate:  [120-144] 137 (10/26 0730) Resp:  [15-23] 15 (10/26 0730) BP: (107-135)/(77-96) 107/79 (10/26 0730) SpO2:  [97 %-100 %] 100 % (10/26 0730) Last BM Date: 04/25/2021  Intake/Output from previous day: No intake/output data recorded. Intake/Output this shift: No intake/output data recorded.  PE: Gen:  Awake, sitting up in bed Card:  Tachycardic  Pulm:  CTAB, no W/R/R, effort normal Abd: Soft, ND, no rigidity or guarding +BS Ext:  No LE edema. Soft wrist restrains.  Skin: no rashes noted, warm and dry  Lab Results:  Recent Labs    04/22/21 0256 04/23/21 0411  WBC 7.9 7.7  HGB 8.0* 9.0*  HCT 24.6* 27.9*  PLT 93* 125*   BMET Recent Labs    04/19/2021 2154  NA 141  K 4.5  CL 109  CO2 24  GLUCOSE 146*  BUN 30*  CREATININE 1.11  CALCIUM 8.4*   PT/INR No results for input(s): LABPROT, INR in the last 72 hours. CMP     Component Value Date/Time   NA 141 04/19/2021 2154   NA 141 08/09/2020 1123   K 4.5 04/17/2021 2154   CL 109 04/01/2021 2154   CO2 24 04/28/2021 2154   GLUCOSE 146 (H) 04/24/2021 2154   BUN 30 (H) 04/12/2021 2154   BUN 25 08/09/2020 1123   CREATININE 1.11 04/09/2021 2154   CALCIUM 8.4 (L) 04/19/2021 2154   PROT 5.0 (L) 04/19/2021 2154   PROT 6.7 08/09/2020 1123   ALBUMIN 2.9 (L)  04/15/2021 2154   ALBUMIN 4.4 08/09/2020 1123   AST 46 (H) 04/16/2021 2154   ALT 25 04/24/2021 2154   ALKPHOS 24 (L) 04/08/2021 2154   BILITOT 0.9 04/19/2021 2154   BILITOT 0.7 08/09/2020 1123   GFRNONAA >60 04/17/2021 2154   GFRAA 65 08/09/2020 1123   Lipase  No results found for: LIPASE  Studies/Results: DG Swallowing Func-Speech Pathology  Result Date: 04/22/2021 Table formatting from the original result was not included. Objective Swallowing Evaluation: Type of Study: MBS-Modified Barium Swallow Study  Patient Details Name: [name] MRN: [id number] Date of Birth: 03-Jan-1930 Today's Date: 04/22/2021 Time: SLP Start Time (ACUTE ONLY): 1330 -SLP Stop Time (ACUTE ONLY): 1962 SLP Time Calculation (min) (ACUTE ONLY): 24 min Past Medical History: Past Medical History: Diagnosis Date  Aortic stenosis   a. s/p tissue AVR 08/2013;  b. Echo (09/2013):  Mild LVH, EF 55-60%, no RWMA, Gr 1 DD, AVR ok (mean gradient 9 mmHg), mild MR, mild LAE, mildly reduced RVSF, mild TR, PASP 32 mmHg.  CAD (coronary artery disease)   Cerebrovascular disease, unspecified   Dementia (HCC)   Glaucoma   Bilateral eyes  HLD (hyperlipidemia)   HOH (hard of hearing)   wears bilateral hearing aids  HTN (hypertension)   Pneumonia 07/23/2013  S/P aortic valve replacement with bioprosthetic valve  09/13/2013  25 mm [facility] Ease bovine bioprosthetic tissue valve  Skin cancer of face   removed from nose Past Surgical History: Past Surgical History: Procedure Laterality Date  AORTIC VALVE REPLACEMENT N/A 09/13/2013  Procedure: AORTIC VALVE REPLACEMENT (AVR);  Surgeon: Rexene Alberts, MD;  Location: [place];  Service: Open Heart Surgery;  Laterality: N/A;  CARDIAC CATHETERIZATION    no PCI  CAROTID ENDARTERECTOMY Right   CATARACT EXTRACTION Bilateral   COLONOSCOPY W/ POLYPECTOMY    EYE SURGERY    HIP PINNING,CANNULATED Right 03/29/2021  Procedure: CANNULATED HIP PINNING;  Surgeon: Renette Butters, MD;  Location: [place];  Service:  Orthopedics;  Laterality: Right;  INTRAOPERATIVE TRANSESOPHAGEAL ECHOCARDIOGRAM N/A 09/13/2013  Procedure: INTRAOPERATIVE TRANSESOPHAGEAL ECHOCARDIOGRAM;  Surgeon: Rexene Alberts, MD;  Location: [place];  Service: Open Heart Surgery;  Laterality: N/A;  LEFT AND RIGHT HEART CATHETERIZATION WITH CORONARY ANGIOGRAM N/A 07/26/2013  Procedure: LEFT AND RIGHT HEART CATHETERIZATION WITH CORONARY ANGIOGRAM;  Surgeon: Sinclair Grooms, MD;  Location: [facility] CATH LAB;  Service: Cardiovascular;  Laterality: N/A;  RETINAL DETACHMENT SURGERY   HPI: Patient is a 87 y/o male who presents on 04/01/2021 with right femoral neck fx s/p fall at home, now s/p hip pinning 04/06/2021. PMH includes dementia, CAD, CVA, HTN, aortic stenosis s/p AVR. He eats softer foods per wife. She denies difficulty swallowing before admission.  Subjective: Pt awake and alert Assessment / Plan / Recommendation CHL IP CLINICAL IMPRESSIONS 04/22/2021 Clinical Impression Pt was seen for an MBS. Overall, pt presents with severe dysphagia secondary to decreased strength and coordination of oral and pharyngeal phases of swallowing. Across consistencies, pt exhibited decreased bolus cohesion, delayed oral transit, and weak lingual manipulation with impaired mastication of solids, lingual pumping with puree, and piecemeal swallowing of honey thick liquid. Pharyngeal phase deficits c/b reduced base of tongue retraction, reduced anterior laryngeal excursion, reduced epiglottic inversion, reduced airway closure and reduced laryngeal elevation resulting in residue in the vallecula, pyriform sinuses, posterior pharyngeal wall, and cricopharyngeal segment across consistencies. Liquids attempted to enter his nasal cavity intermittently due to questionable incomplete velopharyngeal closue. Reduced airway closure resulted in aspiration that did not clear despite attempt (PAS 7) with thin liquid and penetration above the level of the vocal folds that did not clear (PAS 3) with nectar  thick. Pt exhibited no penetration/aspiration with honey thick, however residue was increased. Recommend Dys2/nectar thick liquid diet to aid in bolus preparation with solids and mitigate chances of aspiration with liquids although aspiration risk is high with all consistencies. SLP will follow for management of diet tolerance, upgraded PO trails and agree with palliative care consult as swallow is likely to deteriorate. Suspect pt's swallow function may have been weakened prior and now unable to compensate with this acute illness. SLP Visit Diagnosis Dysphagia, oral phase (R13.11);Dysphagia, pharyngeal phase (R13.13);Dysphagia, pharyngoesophageal phase (R13.14) Attention and concentration deficit following -- Frontal lobe and executive function deficit following -- Impact on safety and function Severe aspiration risk   CHL IP TREATMENT RECOMMENDATION 04/22/2021 Treatment Recommendations Therapy as outlined in treatment plan below   Prognosis 04/22/2021 Prognosis for Safe Diet Advancement Fair Barriers to Reach Goals Cognitive deficits;Severity of deficits Barriers/Prognosis Comment -- CHL IP DIET RECOMMENDATION 04/22/2021 SLP Diet Recommendations Dysphagia 2 (Fine chop) solids;Nectar thick liquid Liquid Administration via Cup Medication Administration Crushed with puree Compensations Minimize environmental distractions;Slow rate;Small sips/bites Postural Changes Remain semi-upright after after feeds/meals (Comment);Seated upright at 90 degrees   CHL IP OTHER RECOMMENDATIONS 04/22/2021 Recommended  Consults -- Oral Care Recommendations Oral care BID Other Recommendations Order thickener from pharmacy   CHL IP FOLLOW UP RECOMMENDATIONS 04/22/2021 Follow up Recommendations 24 hour supervision/assistance   CHL IP FREQUENCY AND DURATION 04/22/2021 Speech Therapy Frequency (ACUTE ONLY) min 2x/week Treatment Duration 2 weeks      CHL IP ORAL PHASE 04/22/2021 Oral Phase Impaired Oral - Pudding Teaspoon -- Oral - Pudding Cup  -- Oral - Honey Teaspoon -- Oral - Honey Cup Weak lingual manipulation;Delayed oral transit;Decreased bolus cohesion Oral - Nectar Teaspoon -- Oral - Nectar Cup Weak lingual manipulation;Delayed oral transit;Decreased bolus cohesion Oral - Nectar Straw -- Oral - Thin Teaspoon -- Oral - Thin Cup Piecemeal swallowing;Weak lingual manipulation;Delayed oral transit;Decreased bolus cohesion Oral - Thin Straw -- Oral - Puree Impaired mastication;Weak lingual manipulation;Lingual pumping;Reduced posterior propulsion;Delayed oral transit;Decreased bolus cohesion Oral - Mech Soft -- Oral - Regular Weak lingual manipulation;Lingual pumping;Impaired mastication;Reduced posterior propulsion;Delayed oral transit;Decreased bolus cohesion Oral - Multi-Consistency -- Oral - Pill -- Oral Phase - Comment --  CHL IP PHARYNGEAL PHASE 04/22/2021 Pharyngeal Phase Impaired Pharyngeal- Pudding Teaspoon -- Pharyngeal -- Pharyngeal- Pudding Cup -- Pharyngeal -- Pharyngeal- Honey Teaspoon -- Pharyngeal -- Pharyngeal- Honey Cup Pharyngeal residue - posterior pharnyx;Pharyngeal residue - pyriform;Pharyngeal residue - valleculae;Pharyngeal residue - cp segment;Reduced laryngeal elevation;Reduced epiglottic inversion;Reduced anterior laryngeal mobility;Reduced airway/laryngeal closure;Reduced tongue base retraction Pharyngeal Material does not enter airway Pharyngeal- Nectar Teaspoon -- Pharyngeal -- Pharyngeal- Nectar Cup Reduced epiglottic inversion;Reduced anterior laryngeal mobility;Reduced laryngeal elevation;Reduced airway/laryngeal closure;Reduced tongue base retraction;Penetration/Aspiration during swallow;Pharyngeal residue - valleculae;Pharyngeal residue - pyriform;Pharyngeal residue - posterior pharnyx;Pharyngeal residue - cp segment Pharyngeal Material enters airway, remains ABOVE vocal cords and not ejected out Pharyngeal- Nectar Straw -- Pharyngeal -- Pharyngeal- Thin Teaspoon -- Pharyngeal -- Pharyngeal- Thin Cup  Penetration/Aspiration during swallow;Moderate aspiration;Pharyngeal residue - valleculae;Pharyngeal residue - pyriform;Pharyngeal residue - posterior pharnyx;Pharyngeal residue - cp segment;Nasopharyngeal reflux;Reduced epiglottic inversion;Reduced anterior laryngeal mobility;Reduced laryngeal elevation;Reduced airway/laryngeal closure;Reduced tongue base retraction Pharyngeal Material enters airway, passes BELOW cords and not ejected out despite cough attempt by patient Pharyngeal- Thin Straw -- Pharyngeal -- Pharyngeal- Puree Reduced epiglottic inversion;Reduced anterior laryngeal mobility;Reduced laryngeal elevation;Reduced airway/laryngeal closure;Reduced tongue base retraction Pharyngeal Material does not enter airway Pharyngeal- Mechanical Soft -- Pharyngeal -- Pharyngeal- Regular Pharyngeal residue - cp segment;Pharyngeal residue - posterior pharnyx;Pharyngeal residue - pyriform;Pharyngeal residue - valleculae;Reduced epiglottic inversion;Reduced anterior laryngeal mobility;Reduced airway/laryngeal closure;Reduced laryngeal elevation;Reduced tongue base retraction Pharyngeal Material does not enter airway Pharyngeal- Multi-consistency -- Pharyngeal -- Pharyngeal- Pill -- Pharyngeal -- Pharyngeal Comment --  CHL IP CERVICAL ESOPHAGEAL PHASE 04/22/2021 Cervical Esophageal Phase Impaired Pudding Teaspoon -- Pudding Cup -- Honey Teaspoon -- Honey Cup -- Nectar Teaspoon -- Nectar Cup -- Nectar Straw -- Thin Teaspoon -- Thin Cup -- Thin Straw -- Puree -- Mechanical Soft -- Regular -- Multi-consistency -- Pill -- Cervical Esophageal Comment -- [name] 04/22/2021, 4:53 PM               Anti-infectives: Anti-infectives (From admission, onward)    Start     Dose/Rate Route Frequency Ordered Stop   04/22/2021 2200  ceFAZolin (ANCEF) IVPB 1 g/50 mL premix        1 g 100 mL/hr over 30 Minutes Intravenous Every 8 hours 04/05/2021 1607 04/22/21 0758   04/01/2021 1057  ceFAZolin (ANCEF) 2-4 GM/100ML-% IVPB        Note to Pharmacy: Rennis Harding   : cabinet override      04/03/2021 1057 04/03/2021 2259   04/19/21  0600  ceFAZolin (ANCEF) IVPB 2g/100 mL premix  Status:  Discontinued        2 g 200 mL/hr over 30 Minutes Intravenous On call to O.R. 04/19/2021 2008 03/29/2021 0559   03/29/2021 1200  ceFAZolin (ANCEF) IVPB 2g/100 mL premix  Status:  Discontinued        2 g 200 mL/hr over 30 Minutes Intravenous On call to O.R. 04/08/2021 0558 04/12/2021 2021        Assessment/Plan GLF Pelvic fracture, R femoral neck fracture - Per Ortho s/p cannulated hip pinning by Dr. Edmonia Lynch on 10/23. WBAT RLE. PT/OT [facility] -  Cards consult for frequent small runs of VTACH. See below.  R lat flank/hip subcutaneous hematoma - hgb stable. Received TXA an d 1 unit of PRBCs at [facility]. Hgb stable.  Thrombocytopenia - improving. Plt 125 Frequent Vtach/PVC/hx of CAD - Per Cards - recs as follows - when taking PO again would retern metoprolol tartrate 25 mg PO BID. Will restart 04/30/23 now that he has passed for a diet. Zetia 10 mg is reasonable to return. If blood pressure becomes and issues with appropriate pain control, can add back ramipril 5 mg; otherwise this can be returned as outpatient. Cards s/o 10/24.  HTN - As above HLD Cerebrovascular disease Dementia  Hypothyroidism - IV synthroid. Switch to PO Frail state - palliative care talking with family. DNR Dysphagia - slp following Post op delirium/agitation - Soft restraints. PRN haldol. Palliative added Buspar. Monitor QTc (474 on 10/24) FEN - Dys2, nectar thick. Increase bowel regimen ID - ancef peri-op for Ortho procedure. None currently.  VTE - SCDs, lovenox. Restarted ASA as recommended by Cards. Will need ASA 81mg  bid x 30 days upon d/c per Ortho.  Foley - Out. Voiding.  Dispo - PT/OT. SNF. Palliative discussions.    LOS: 5 days    Jillyn Ledger , [facility] Surgery 04-29-2021, 9:08 AM Please see Amion for pager number during day hours  7:00am-4:30pm

## 2021-04-23 NOTE — Progress Notes (Signed)
PHARMACIST - PHYSICIAN COMMUNICATION  DR:   Trauma Team  CONCERNING: IV to Oral Route Change Policy  RECOMMENDATION: This patient is receiving Levothyroxine by the intravenous route.  Based on criteria approved by the Pharmacy and Therapeutics Committee, the intravenous medication(s) is/are being converted to the equivalent oral dose form(s).   DESCRIPTION: These criteria include: The patient is eating (either orally or via tube) and/or has been taking other orally administered medications for a least 24 hours The patient has no evidence of active gastrointestinal bleeding or impaired GI absorption (gastrectomy, short bowel, patient on TNA or NPO).  If you have questions about this conversion, please contact the Pharmacy Department  []   [phone] )  [facility] []   [phone] )  [facility] [x]   [phone] )  [facility] []   [phone] )  [facility] []   [phone] )  [facility]   Thank you for allowing pharmacy to be a part of this patient's care.  Alycia Rossetti, PharmD, BCPS Clinical Pharmacist Clinical phone for 04/23/2021: [phone] 04/23/2021 2:03 PM   **Pharmacist phone directory can now be found on [email or web address] ([email or web address]).  Listed under [place].

## 2021-04-23 NOTE — Progress Notes (Signed)
    Subjective: Patient has dementia and is largely non-verbal. Sitting up in bed. Moaning and making non-sensical speech. Urinating. Has worked some with PT on [place] but difficult due to cognitive level. Family aware of his decline. May consider hospice.   Objective:   VITALS:   Vitals:   04/23/21 0459 04/23/21 0730 04/23/21 1130 04/23/21 1434  BP: 116/77 107/79 107/71 (!) 129/98  Pulse: (!) 121 (!) 137 (!) 131 (!) 147  Resp: 15 15 14 16   Temp: 97.8 F (36.6 C) (!) 97.3 F (36.3 C) 98.6 F (37 C) 99 F (37.2 C)  TempSrc: Oral Oral Axillary Axillary  SpO2: 99% 100% 100% 100%  Weight:      Height:       CBC Latest Ref Rng & Units 04/23/2021 04/22/2021 04/21/2021  WBC 4.0 - 10.5 K/uL 7.7 7.9 7.3  Hemoglobin 13.0 - 17.0 g/dL 9.0(L) 8.0(L) 8.3(L)  Hematocrit 39.0 - 52.0 % 27.9(L) 24.6(L) 25.8(L)  Platelets 150 - 400 K/uL 125(L) 93(L) 76(L)   BMP Latest Ref Rng & Units 04/04/2021 04/17/2021 04/19/2021  Glucose 70 - 99 mg/dL 146(H) 116(H) 127(H)  BUN 8 - 23 mg/dL 30(H) 34(H) 42(H)  Creatinine 0.61 - 1.24 mg/dL 1.11 1.25(H) 1.45(H)  BUN/Creat Ratio 10 - 24 - - -  Sodium 135 - 145 mmol/L 141 140 140  Potassium 3.5 - 5.1 mmol/L 4.5 4.1 5.0  Chloride 98 - 111 mmol/L 109 110 109  CO2 22 - 32 mmol/L 24 23 26   Calcium 8.9 - 10.3 mg/dL 8.4(L) 8.6(L) 8.5(L)   Intake/Output      10/25 0701 10/26 0700 10/26 0701 10/27 0700   P.O. 0 60   Total Intake(mL/kg) 0 (0) 60 (1)   Net 0 +60        Urine Occurrence  1 x      Physical Exam: General: NAD. Sitting up in bed. Appears very frail. Wearing mittens Resp: No increased wob Cardio: regular rate and rhythm ABD soft Neurologically intact MSK Neurovascularly intact Sensation intact distally, pulls away from painful stimuli Intact pulses distally Dorsiflexion/Plantar flexion intact Moves toes Incision: dressing C/D/I Ecchymosis on thigh   Assessment: 3 Days Post-Op  S/P Procedure(s) (LRB): CANNULATED HIP PINNING  (Right) by Dr. Ernesta Amble. Percell Miller on 04/02/2021  Active Problems:   Closed right hip fracture ([place])   Plan:  Advance diet Up with therapy if able Incentive Spirometry Elevate and Apply ice  Weightbearing: WBAT RLE Insicional and dressing care: Dressings left intact until follow-up and Reinforce dressings as needed Orthopedic device(s): None Showering: Keep dressing dry VTE prophylaxis:  Lovenox 30mg  daily   while inpatient, can switch to ASA 81mg  bid x 30 days upon d/c , SCDs, ambulation Pain control: continue current regimen, minimize narcotics d/t dementia Follow - up plan: 2 weeks post op Contact information:  Edmonia Lynch MD, Aggie Moats PA-C  Dispo:  TBD. Will most likely need SNF vs. hospice . Prognosis for ability to return home not good and family is aware of this. Appreciate palliative care's help.   Britt Bottom, PA-C Office [phone] 04/23/2021, 2:55 PM

## 2021-04-23 NOTE — Progress Notes (Signed)
Physical Therapy Treatment Patient Details Name: [name] MRN: [id number] DOB: [birth year] Today's Date: 04/23/2021   History of Present Illness Patient is a 87 y/o male who presents on 04/06/2021 with right femoral neck fx s/p fall at home, now s/p hip pinning 04/08/2021. PMH includes dementia, CAD, CVA, HTN, aortic stenosis s/p AVR.    PT Comments    Pt sleeping upon arrival to room, very HOH but wakes with PT touching pt shoulder and stating pt's name loudly in R ear. Pt tolerated to/from EOB and x10 minutes EOB siting well, requires very increased time to avoid agitation. afib rhythm, HR 120s-160 bpm during session. Pt's wife and niece at bedside, very supportive. Wrist restraints reapplied at end of session. PT to continue to follow.      Recommendations for follow up therapy are one component of a multi-disciplinary discharge planning process, led by the attending physician.  Recommendations may be updated based on patient status, additional functional criteria and insurance authorization.  Follow Up Recommendations  Skilled nursing-short term rehab (<3 hours/day)     Assistance Recommended at Discharge Frequent or constant Supervision/Assistance  Equipment Recommendations  None recommended by PT    Recommendations for Other Services       Precautions / Restrictions Precautions Precautions: Fall Restrictions RLE Weight Bearing: Weight bearing as tolerated     Mobility  Bed Mobility Overal bed mobility: Needs Assistance Bed Mobility: Supine to Sit;Sit to Supine     Supine to sit: Total assist Sit to supine: Total assist   General bed mobility comments: total assist for all aspects, pt grimacing and moaning with transition to/from EOB but subsides when stationary.    Transfers                   General transfer comment: Deferred    Ambulation/Gait                 Stairs             Wheelchair Mobility    Modified Rankin (Stroke  Patients Only)       Balance Overall balance assessment: Needs assistance;History of Falls Sitting-balance support: No upper extremity supported Sitting balance-Leahy Scale: Fair Sitting balance - Comments: able to maintain EOB sitting without PT support x10 minutes, preference for L lateral leaning especially with fatigue Postural control: Left lateral lean                                  Cognition Arousal/Alertness: Awake/alert Behavior During Therapy: Restless Overall Cognitive Status: History of cognitive impairments - at baseline                                 General Comments: history of dementia, very HOH, no agitation today but restless and needs increased time to make transitions (supine>EOB, LE exercises at EOB, return to supine).        Exercises General Exercises - Lower Extremity Long Arc Quad: AAROM;Right;5 reps;Seated    General Comments General comments (skin integrity, edema, etc.): afib rhythm, HR 120s-160 bpm      Pertinent Vitals/Pain Pain Assessment: Faces Faces Pain Scale: Hurts even more Pain Location: RLE, when performing AAROM or bed mobility Pain Descriptors / Indicators: Discomfort;Grimacing;Guarding;Moaning Pain Intervention(s): Limited activity within patient's tolerance;Monitored during session;Repositioned    Home Living  Prior Function            PT Goals (current goals can now be found in the care plan section) Acute Rehab PT Goals PT Goal Formulation: With patient/family Time For Goal Achievement: 05/05/21 Potential to Achieve Goals: Fair Progress towards PT goals: Progressing toward goals    Frequency    Min 3X/week      PT Plan Current plan remains appropriate    Co-evaluation              AM-PAC PT "6 Clicks" Mobility   Outcome Measure  Help needed turning from your back to your side while in a flat bed without using bedrails?: Total Help needed  moving from lying on your back to sitting on the side of a flat bed without using bedrails?: Total Help needed moving to and from a bed to a chair (including a wheelchair)?: Total Help needed standing up from a chair using your arms (e.g., wheelchair or bedside chair)?: Total Help needed to walk in hospital room?: Total Help needed climbing 3-5 steps with a railing? : Total 6 Click Score: 6    End of Session   Activity Tolerance: Other (comment);Patient limited by fatigue (restlessness, trying to avoid escalation to agitation) Patient left: in bed;with call bell/phone within reach;with bed alarm set;with restraints reapplied;with nursing/sitter in room;with SCD's reapplied Nurse Communication: Mobility status;Other (comment) (afib) PT Visit Diagnosis: Muscle weakness (generalized) (M62.81);Difficulty in walking, not elsewhere classified (R26.2)     Time: 5003-7048 PT Time Calculation (min) (ACUTE ONLY): 21 min  Charges:  $Therapeutic Activity: 8-22 mins            Stacie Glaze, PT DPT Acute Rehabilitation Services Pager [phone]  Office [phone]    Roxine Caddy E Ruffin Pyo 04/23/2021, 1:57 PM

## 2021-04-24 LAB — GLUCOSE, CAPILLARY: Glucose-Capillary: 131 mg/dL — ABNORMAL HIGH (ref 70–99)

## 2021-04-24 MED ORDER — ASPIRIN EC 81 MG PO TBEC: 81.0000 mg | DELAYED_RELEASE_TABLET | Freq: Two times a day (BID) | ORAL | 0 refills | Status: AC

## 2021-04-24 MED ORDER — DOCUSATE SODIUM 50 MG/5ML PO LIQD
100.0000 mg | Freq: Two times a day (BID) | ORAL | Status: DC
  Administered 2021-04-24 – 2021-04-25 (×3): 100 mg via ORAL
  Filled 2021-04-24 (×3): qty 10

## 2021-04-24 MED ORDER — TRAMADOL HCL 50 MG PO TABS: 50.0000 mg | ORAL_TABLET | Freq: Four times a day (QID) | ORAL | 0 refills | Status: AC | PRN

## 2021-04-24 MED ORDER — ASPIRIN 81 MG PO CHEW
81.0000 mg | CHEWABLE_TABLET | Freq: Every day | ORAL | Status: DC
  Administered 2021-04-24 – 2021-04-25 (×2): 81 mg via ORAL
  Filled 2021-04-24 (×2): qty 1

## 2021-04-24 NOTE — Progress Notes (Signed)
Trauma Response Nurse Note-  Reason for Call / Reason for Trauma activation:   - called by nursing staff to evaluate swelling to patient's penis  Initial Focused Assessment (If applicable, or please see trauma documentation):  - Swelling to groin, able to retract foreskin without difficulty. Voiding per primary RN. Bruising noted to shaft of penis and scrotum, per primary RN bruising is unchanged from yesterday. Chart review does not reflect any groin bruising or swelling.  Interventions:  - Notified Dr. Zenia Resides trauma attending via text  Plan of Care as of this note:  - Waiting for further instruction from trauma attending at this time  Please contact TRN for further assistance. [phone]

## 2021-04-24 NOTE — TOC Initial Note (Signed)
Transition of Care [facility]) - Initial/Assessment Note    Patient Details  Name: [name] MRN: [id number] Date of Birth: Apr 19, 1930  Transition of Care [facility]) CM/SW Contact:    Ella Bodo, RN Phone Number: 04/24/2021, 5:21 PM  Clinical Narrative:                 Patient is a 87 y/o male who presents on 04/09/2021 with right femoral neck fx s/p fall at home, now s/p hip pinning 04/15/2021. Prior to admission, patient required assistance with ADLs, and uses cane for mobility; he lives with his wife, Stanton Kidney.  PT/OT recommending skilled nursing facility placement, however patient is being followed by Palliative Medicine Team for goals of care, due to decline in function.  Family is interested in seeing how the patient does with therapies and p.o. intake prior to making a decision on comfort measures or hospice residential facility.  TOC case manager will continue to follow/assist with disposition as needed.  Expected Discharge Plan: Skilled Nursing Facility Barriers to Discharge: Continued Medical Work up        Expected Discharge Plan and Services Expected Discharge Plan: [place]   Discharge Planning Services: CM Consult   Living arrangements for the past 2 months: Single Family Home                                      Prior Living Arrangements/Services Living arrangements for the past 2 months: Single Family Home Lives with:: Spouse Patient language and need for interpreter reviewed:: Yes Do you feel safe going back to the place where you live?: Yes      Need for Family Participation in Patient Care: Yes (Comment) Care giver support system in place?: Yes (comment)   Criminal Activity/Legal Involvement Pertinent to Current Situation/Hospitalization: No - Comment as needed  Activities of Daily Living      Permission Sought/Granted                  Emotional Assessment Appearance:: Appears stated age Attitude/Demeanor/Rapport:  Lethargic Affect (typically observed): Appropriate Orientation: : Oriented to Self      Admission diagnosis:  Renal insufficiency [N28.9] Closed right hip fracture ([place]) [S72.001A] Closed fracture of neck of right femur, initial encounter ([place]) [S72.001A] Closed fracture of pelvis, initial encounter ([place]) [S32.9XXA] Closed fracture of anterior column of right acetabulum, initial encounter ([place]) [S32.431A] Hypotension, unspecified hypotension type [I95.9] Patient Active Problem List   Diagnosis Date Noted   Closed right hip fracture ([place]) 04/19/2021   Sensorineural hearing loss (SNHL) of both ears 04/18/2018   Hypothyroidism 02/27/2016   S/P aortic valve replacement with bioprosthetic valve 09/13/2013   Thrombocytopenia ([place]) 08/17/2013   Pulsatile groin mass 08/17/2013   CAD (coronary artery disease) 08/17/2013   Allergic dermatitis 07/31/2013   PREMATURE VENTRICULAR CONTRACTIONS 05/13/2010   PALPITATIONS 05/07/2010   ARM NUMBNESS 01/04/2009   Hyperlipemia 01/02/2009   Essential hypertension 01/02/2009   Aortic valve disorder 01/02/2009   Cerebrovascular disease 01/02/2009   DYSPNEA 01/02/2009   PCP:  [name], MD Pharmacy:   [place], [place] [address] Phone: [phone] Fax: [phone]  [place] [address] [place] [address] Phone: [phone] Fax: [phone]     Social Determinants of Health (SDOH) Interventions  Readmission Risk Interventions No flowsheet data found.  Reinaldo Raddle, RN, BSN  Trauma/Neuro ICU Case Manager [phone]

## 2021-04-24 NOTE — Progress Notes (Signed)
4 Days Post-Op  Subjective: CC: Patient the most awake that I have seen him today. Answers that his name is "[name]" and that he doesn't know where he is, why he is here or what year it is. Denies any pain.   Objective: Vital signs in last 24 hours: Temp:  [98 F (36.7 C)-99 F (37.2 C)] 98.7 F (37.1 C) (10/27 0721) Pulse Rate:  [114-148] 116 (10/27 0721) Resp:  [14-40] 18 (10/27 0721) BP: (107-137)/(71-98) 115/86 (10/27 0721) SpO2:  [98 %-100 %] 100 % (10/27 0721) Last BM Date: 04/21/2021  Intake/Output from previous day: 10/26 0701 - 10/27 0700 In: 360 [P.O.:360] Out: -  Intake/Output this shift: No intake/output data recorded.  PE: Gen:  Awake, sitting up in bed Card:  Tachycardic  Pulm:  CTAB, no W/R/R, effort normal Abd: Soft, ND, no rigidity or guarding +BS Ext:  No LE edema. Soft wrist restrains.  Skin: no rashes noted, warm and dry  Lab Results:  Recent Labs    04/22/21 0256 04/23/21 0411  WBC 7.9 7.7  HGB 8.0* 9.0*  HCT 24.6* 27.9*  PLT 93* 125*   BMET No results for input(s): NA, K, CL, CO2, GLUCOSE, BUN, CREATININE, CALCIUM in the last 72 hours. PT/INR No results for input(s): LABPROT, INR in the last 72 hours. CMP     Component Value Date/Time   NA 141 04/02/2021 2154   NA 141 08/09/2020 1123   K 4.5 03/30/2021 2154   CL 109 04/06/2021 2154   CO2 24 04/09/2021 2154   GLUCOSE 146 (H) 04/10/2021 2154   BUN 30 (H) 04/22/2021 2154   BUN 25 08/09/2020 1123   CREATININE 1.11 04/02/2021 2154   CALCIUM 8.4 (L) 04/24/2021 2154   PROT 5.0 (L) 04/10/2021 2154   PROT 6.7 08/09/2020 1123   ALBUMIN 2.9 (L) 04/23/2021 2154   ALBUMIN 4.4 08/09/2020 1123   AST 46 (H) 03/31/2021 2154   ALT 25 04/07/2021 2154   ALKPHOS 24 (L) 04/08/2021 2154   BILITOT 0.9 04/12/2021 2154   BILITOT 0.7 08/09/2020 1123   GFRNONAA >60 04/19/2021 2154   GFRAA 65 08/09/2020 1123   Lipase  No results found for: LIPASE  Studies/Results: DG Swallowing Func-Speech  Pathology  Result Date: 04/22/2021 Table formatting from the original result was not included. Objective Swallowing Evaluation: Type of Study: MBS-Modified Barium Swallow Study  Patient Details Name: [name] MRN: [id number] Date of Birth: 12/06/1929 Today's Date: 04/22/2021 Time: SLP Start Time (ACUTE ONLY): 1330 -SLP Stop Time (ACUTE ONLY): 6160 SLP Time Calculation (min) (ACUTE ONLY): 24 min Past Medical History: Past Medical History: Diagnosis Date  Aortic stenosis   a. s/p tissue AVR 08/2013;  b. Echo (09/2013):  Mild LVH, EF 55-60%, no RWMA, Gr 1 DD, AVR ok (mean gradient 9 mmHg), mild MR, mild LAE, mildly reduced RVSF, mild TR, PASP 32 mmHg.  CAD (coronary artery disease)   Cerebrovascular disease, unspecified   Dementia (HCC)   Glaucoma   Bilateral eyes  HLD (hyperlipidemia)   HOH (hard of hearing)   wears bilateral hearing aids  HTN (hypertension)   Pneumonia 07/23/2013  S/P aortic valve replacement with bioprosthetic valve 09/13/2013  25 mm [facility] Ease bovine bioprosthetic tissue valve  Skin cancer of face   removed from nose Past Surgical History: Past Surgical History: Procedure Laterality Date  AORTIC VALVE REPLACEMENT N/A 09/13/2013  Procedure: AORTIC VALVE REPLACEMENT (AVR);  Surgeon: Rexene Alberts, MD;  Location: [place];  Service: Open  Heart Surgery;  Laterality: N/A;  CARDIAC CATHETERIZATION    no PCI  CAROTID ENDARTERECTOMY Right   CATARACT EXTRACTION Bilateral   COLONOSCOPY W/ POLYPECTOMY    EYE SURGERY    HIP PINNING,CANNULATED Right 04/01/2021  Procedure: CANNULATED HIP PINNING;  Surgeon: Renette Butters, MD;  Location: [place];  Service: Orthopedics;  Laterality: Right;  INTRAOPERATIVE TRANSESOPHAGEAL ECHOCARDIOGRAM N/A 09/13/2013  Procedure: INTRAOPERATIVE TRANSESOPHAGEAL ECHOCARDIOGRAM;  Surgeon: Rexene Alberts, MD;  Location: [place];  Service: Open Heart Surgery;  Laterality: N/A;  LEFT AND RIGHT HEART CATHETERIZATION WITH CORONARY ANGIOGRAM N/A 07/26/2013  Procedure: LEFT AND RIGHT  HEART CATHETERIZATION WITH CORONARY ANGIOGRAM;  Surgeon: Sinclair Grooms, MD;  Location: [facility] CATH LAB;  Service: Cardiovascular;  Laterality: N/A;  RETINAL DETACHMENT SURGERY   HPI: Patient is a 87 y/o male who presents on 03/29/2021 with right femoral neck fx s/p fall at home, now s/p hip pinning 04/06/2021. PMH includes dementia, CAD, CVA, HTN, aortic stenosis s/p AVR. He eats softer foods per wife. She denies difficulty swallowing before admission.  Subjective: Pt awake and alert Assessment / Plan / Recommendation CHL IP CLINICAL IMPRESSIONS 04/22/2021 Clinical Impression Pt was seen for an MBS. Overall, pt presents with severe dysphagia secondary to decreased strength and coordination of oral and pharyngeal phases of swallowing. Across consistencies, pt exhibited decreased bolus cohesion, delayed oral transit, and weak lingual manipulation with impaired mastication of solids, lingual pumping with puree, and piecemeal swallowing of honey thick liquid. Pharyngeal phase deficits c/b reduced base of tongue retraction, reduced anterior laryngeal excursion, reduced epiglottic inversion, reduced airway closure and reduced laryngeal elevation resulting in residue in the vallecula, pyriform sinuses, posterior pharyngeal wall, and cricopharyngeal segment across consistencies. Liquids attempted to enter his nasal cavity intermittently due to questionable incomplete velopharyngeal closue. Reduced airway closure resulted in aspiration that did not clear despite attempt (PAS 7) with thin liquid and penetration above the level of the vocal folds that did not clear (PAS 3) with nectar thick. Pt exhibited no penetration/aspiration with honey thick, however residue was increased. Recommend Dys2/nectar thick liquid diet to aid in bolus preparation with solids and mitigate chances of aspiration with liquids although aspiration risk is high with all consistencies. SLP will follow for management of diet tolerance, upgraded PO trails and  agree with palliative care consult as swallow is likely to deteriorate. Suspect pt's swallow function may have been weakened prior and now unable to compensate with this acute illness. SLP Visit Diagnosis Dysphagia, oral phase (R13.11);Dysphagia, pharyngeal phase (R13.13);Dysphagia, pharyngoesophageal phase (R13.14) Attention and concentration deficit following -- Frontal lobe and executive function deficit following -- Impact on safety and function Severe aspiration risk   CHL IP TREATMENT RECOMMENDATION 04/22/2021 Treatment Recommendations Therapy as outlined in treatment plan below   Prognosis 04/22/2021 Prognosis for Safe Diet Advancement Fair Barriers to Reach Goals Cognitive deficits;Severity of deficits Barriers/Prognosis Comment -- CHL IP DIET RECOMMENDATION 04/22/2021 SLP Diet Recommendations Dysphagia 2 (Fine chop) solids;Nectar thick liquid Liquid Administration via Cup Medication Administration Crushed with puree Compensations Minimize environmental distractions;Slow rate;Small sips/bites Postural Changes Remain semi-upright after after feeds/meals (Comment);Seated upright at 90 degrees   CHL IP OTHER RECOMMENDATIONS 04/22/2021 Recommended Consults -- Oral Care Recommendations Oral care BID Other Recommendations Order thickener from pharmacy   CHL IP FOLLOW UP RECOMMENDATIONS 04/22/2021 Follow up Recommendations 24 hour supervision/assistance   CHL IP FREQUENCY AND DURATION 04/22/2021 Speech Therapy Frequency (ACUTE ONLY) min 2x/week Treatment Duration 2 weeks      CHL IP ORAL  PHASE 04/22/2021 Oral Phase Impaired Oral - Pudding Teaspoon -- Oral - Pudding Cup -- Oral - Honey Teaspoon -- Oral - Honey Cup Weak lingual manipulation;Delayed oral transit;Decreased bolus cohesion Oral - Nectar Teaspoon -- Oral - Nectar Cup Weak lingual manipulation;Delayed oral transit;Decreased bolus cohesion Oral - Nectar Straw -- Oral - Thin Teaspoon -- Oral - Thin Cup Piecemeal swallowing;Weak lingual manipulation;Delayed  oral transit;Decreased bolus cohesion Oral - Thin Straw -- Oral - Puree Impaired mastication;Weak lingual manipulation;Lingual pumping;Reduced posterior propulsion;Delayed oral transit;Decreased bolus cohesion Oral - Mech Soft -- Oral - Regular Weak lingual manipulation;Lingual pumping;Impaired mastication;Reduced posterior propulsion;Delayed oral transit;Decreased bolus cohesion Oral - Multi-Consistency -- Oral - Pill -- Oral Phase - Comment --  CHL IP PHARYNGEAL PHASE 04/22/2021 Pharyngeal Phase Impaired Pharyngeal- Pudding Teaspoon -- Pharyngeal -- Pharyngeal- Pudding Cup -- Pharyngeal -- Pharyngeal- Honey Teaspoon -- Pharyngeal -- Pharyngeal- Honey Cup Pharyngeal residue - posterior pharnyx;Pharyngeal residue - pyriform;Pharyngeal residue - valleculae;Pharyngeal residue - cp segment;Reduced laryngeal elevation;Reduced epiglottic inversion;Reduced anterior laryngeal mobility;Reduced airway/laryngeal closure;Reduced tongue base retraction Pharyngeal Material does not enter airway Pharyngeal- Nectar Teaspoon -- Pharyngeal -- Pharyngeal- Nectar Cup Reduced epiglottic inversion;Reduced anterior laryngeal mobility;Reduced laryngeal elevation;Reduced airway/laryngeal closure;Reduced tongue base retraction;Penetration/Aspiration during swallow;Pharyngeal residue - valleculae;Pharyngeal residue - pyriform;Pharyngeal residue - posterior pharnyx;Pharyngeal residue - cp segment Pharyngeal Material enters airway, remains ABOVE vocal cords and not ejected out Pharyngeal- Nectar Straw -- Pharyngeal -- Pharyngeal- Thin Teaspoon -- Pharyngeal -- Pharyngeal- Thin Cup Penetration/Aspiration during swallow;Moderate aspiration;Pharyngeal residue - valleculae;Pharyngeal residue - pyriform;Pharyngeal residue - posterior pharnyx;Pharyngeal residue - cp segment;Nasopharyngeal reflux;Reduced epiglottic inversion;Reduced anterior laryngeal mobility;Reduced laryngeal elevation;Reduced airway/laryngeal closure;Reduced tongue base retraction  Pharyngeal Material enters airway, passes BELOW cords and not ejected out despite cough attempt by patient Pharyngeal- Thin Straw -- Pharyngeal -- Pharyngeal- Puree Reduced epiglottic inversion;Reduced anterior laryngeal mobility;Reduced laryngeal elevation;Reduced airway/laryngeal closure;Reduced tongue base retraction Pharyngeal Material does not enter airway Pharyngeal- Mechanical Soft -- Pharyngeal -- Pharyngeal- Regular Pharyngeal residue - cp segment;Pharyngeal residue - posterior pharnyx;Pharyngeal residue - pyriform;Pharyngeal residue - valleculae;Reduced epiglottic inversion;Reduced anterior laryngeal mobility;Reduced airway/laryngeal closure;Reduced laryngeal elevation;Reduced tongue base retraction Pharyngeal Material does not enter airway Pharyngeal- Multi-consistency -- Pharyngeal -- Pharyngeal- Pill -- Pharyngeal -- Pharyngeal Comment --  CHL IP CERVICAL ESOPHAGEAL PHASE 04/22/2021 Cervical Esophageal Phase Impaired Pudding Teaspoon -- Pudding Cup -- Honey Teaspoon -- Honey Cup -- Nectar Teaspoon -- Nectar Cup -- Nectar Straw -- Thin Teaspoon -- Thin Cup -- Thin Straw -- Puree -- Mechanical Soft -- Regular -- Multi-consistency -- Pill -- Cervical Esophageal Comment -- [name] 04/22/2021, 4:53 PM               Anti-infectives: Anti-infectives (From admission, onward)    Start     Dose/Rate Route Frequency Ordered Stop   04/19/2021 2200  ceFAZolin (ANCEF) IVPB 1 g/50 mL premix        1 g 100 mL/hr over 30 Minutes Intravenous Every 8 hours 04/04/2021 1607 04/22/21 0758   04/17/2021 1057  ceFAZolin (ANCEF) 2-4 GM/100ML-% IVPB       Note to Pharmacy: Rennis Harding   : cabinet override      04/17/2021 1057 04/11/2021 2259   04/19/21 0600  ceFAZolin (ANCEF) IVPB 2g/100 mL premix  Status:  Discontinued        2 g 200 mL/hr over 30 Minutes Intravenous On call to O.R. 04/11/2021 2008 04/22/2021 0559   04/07/2021 1200  ceFAZolin (ANCEF) IVPB 2g/100 mL premix  Status:  Discontinued  2 g 200  mL/hr over 30 Minutes Intravenous On call to O.R. 04/23/2021 0558 03/29/2021 2021       Assessment/Plan GLF Pelvic fracture, R femoral neck fracture - Per Ortho s/p cannulated hip pinning by Dr. Edmonia Lynch on 10/23. WBAT RLE. PT/OT [facility] -  Cards consult for frequent small runs of VTACH. See below.  R lat flank/hip subcutaneous hematoma - hgb stable. Received TXA an d 1 unit of PRBCs at [facility]. Hgb stable on labs 05-20-2023.  Thrombocytopenia - improving. Plt 125 on labs 20-May-2023 Frequent Vtach/PVC/hx of CAD - Per Cards - recs as follows - when taking PO again would retern metoprolol tartrate 25 mg PO BID. Restarted May 20, 2023 now that he has passed for a diet. Zetia 10 mg is reasonable to return. If blood pressure becomes and issues with appropriate pain control, can add back ramipril 5 mg; otherwise this can be returned as outpatient. Cards s/o 10/24.  HTN - As above HLD Cerebrovascular disease Dementia  Hypothyroidism - IV synthroid. Switch to PO Frail state - palliative care talking with family. DNR Dysphagia - slp following Post op delirium/agitation - Soft restraints. PRN haldol. Palliative added Buspar. Monitor QTc (474 on 10/24) FEN - Dys1, nectar thick. Bowel regimen ID - ancef peri-op for Ortho procedure. None currently.  VTE - SCDs, lovenox. ASA as recommended by Cards. Will need ASA 81mg  bid x 30 days upon d/c per Ortho.  Foley - Out. Voiding.  Dispo - PT/OT. SNF. Palliative discussions. Family considering hospice if patient does not improve.    LOS: 6 days    Jillyn Ledger , [facility] Surgery 04/24/2021, 8:59 AM Please see Amion for pager number during day hours 7:00am-4:30pm

## 2021-04-24 NOTE — Progress Notes (Signed)
11:20pm RN and CNA went to change pt. Genitals increased bruising and swelling. Charge RN made aware, trauma RN came to assess. TRN paged MD. No new orders at this time.

## 2021-04-24 NOTE — Progress Notes (Addendum)
Discussed patient with palliative yesterday. Agree with monitoring/watching waiting to see if patient improves and "if unable to transition to rehabilitation d/t inability to follow direction w. PT/OT may need to focus on comfort and hospice care".   Attempted to reach out to family via phone today x 2. Family was not in the room this morning when I rounded or when I just went by this afternoon to check.   We discussed patient in multi disciplinary rounds this afternoon. I have asked for PT to reach out to me and let me know how he does during today's/tomorrow's sessions.   Will try to reach out to family again in the AM.   Addendum: I was able to get in touch with the patient's wife, [name], over the phone. She confirmed the plan that they would like to see how the patient does with therapies/po intake etc before deciding on the direction of SNF vs comfort/hospice care. She plans to come in tomorrow. We will plan to have further discussions after we get more information from today's and tomorrow's therapy sessions.

## 2021-04-25 MED ORDER — MELATONIN 3 MG PO TABS
3.0000 mg | ORAL_TABLET | Freq: Every day | ORAL | Status: DC
  Administered 2021-04-25: 3 mg via ORAL
  Filled 2021-04-25: qty 1

## 2021-04-25 MED ORDER — METOPROLOL TARTRATE 5 MG/5ML IV SOLN
5.0000 mg | Freq: Once | INTRAVENOUS | Status: AC
  Administered 2021-04-25: 5 mg via INTRAVENOUS

## 2021-04-25 NOTE — Progress Notes (Signed)
Speech Language Pathology Treatment: Dysphagia  Patient Details Name: [name] MRN: [id number] DOB: 1929-11-05 Today's Date: 04/25/2021 Time: 2229-7989 SLP Time Calculation (min) (ACUTE ONLY): 18 min  Assessment / Plan / Recommendation Clinical Impression  Pt continues to exhibit indicators of aspiration. He was upright in chair and audible coughing heard frequently as therapist reviewing chart. RN tech cleaned oral cavity removing copious mucous. Pt coughs however due to weakness he is unable to expectorate majority of the time and mucous aggregates and dries on soft palate, palatopharyngeal arches. Family present (different niece and wife) and explained/reviewed relationship between dysphagia and hip fractures/other illnesses. Swallows with puree and nectar thick marked by audible and multiple swallows, delayed consistent coughs and weak swallows (confirmed by MBS). Discussed pt's likely aspiration status and progression, potential consequences. Palliative care involved and can be an helpful source as family navigates pt's illnesses and condition. Continue puree (Dys 1), nectar, sit upright, small sips, slow pace and stop eating if coughing becomes significant. He frequently requests water and could consider the water protocol.     HPI HPI: Patient is a 87 y/o male who presents on 03/31/2021 with right femoral neck fx s/p fall at home, now s/p hip pinning 04/15/2021. PMH includes dementia, CAD, CVA, HTN, aortic stenosis s/p AVR. He eats softer foods per wife. She denies difficulty swallowing before admission.      SLP Plan  Continue with current plan of care      Recommendations for follow up therapy are one component of a multi-disciplinary discharge planning process, led by the attending physician.  Recommendations may be updated based on patient status, additional functional criteria and insurance authorization.    Recommendations  Diet recommendations: Dysphagia 1 (puree);Nectar-thick  liquid Liquids provided via: Cup;Teaspoon (cup or spoon) Medication Administration: Crushed with puree Supervision: Staff to assist with self feeding;Full supervision/cueing for compensatory strategies Compensations: Minimize environmental distractions;Slow rate;Small sips/bites Postural Changes and/or Swallow Maneuvers: Seated upright 90 degrees                Oral Care Recommendations: Oral care QID Follow up Recommendations: 24 hour supervision/assistance SLP Visit Diagnosis: Dysphagia, oral phase (R13.11);Dysphagia, pharyngeal phase (R13.13);Dysphagia, pharyngoesophageal phase (R13.14) Plan: Continue with current plan of care                       [name]  04/25/2021, 11:54 AM  Orbie Pyo Colvin Caroli.Ed Risk analyst [phone] Office [phone]

## 2021-04-25 NOTE — Progress Notes (Addendum)
5 Days Post-Op  Subjective: CC: Patient was reported by RN to be able to get out of restraints yesterday and feed himself 100% of lunch and most of dinner. He is currently lying in bed and opens eyes/grunts to questioning but does not verbally responds with words or follow commands. TRN note reports groin swelling.   Objective: Vital signs in last 24 hours: Temp:  [98.8 F (37.1 C)-99.8 F (37.7 C)] 99.8 F (37.7 C) (10/28 0805) Pulse Rate:  [109-136] 120 (10/28 0805) Resp:  [17-26] 20 (10/28 0805) BP: (111-142)/(66-92) 142/90 (10/28 0805) SpO2:  [99 %-100 %] 99 % (10/28 0805) Last BM Date: 04/07/2021  Intake/Output from previous day: 10/27 0701 - 10/28 0700 In: 4734 [P.O.:540; I.V.:4194] Out: -  Intake/Output this shift: No intake/output data recorded.  PE: Gen:  NAD Card:  Tachycardic  Pulm:  CTAB, no W/R/R, effort normal Abd: Soft, ND, no rigidity or guarding +BS GU: Chaperone present. Patient with mild R scrotal edema with ecchymosis of the right scrotum. No obvious hernia. He does not appear to have any ttp over this area. Ext:  No LE edema.  Skin: no rashes noted, warm and dry  Lab Results:  Recent Labs    04/23/21 0411  WBC 7.7  HGB 9.0*  HCT 27.9*  PLT 125*   BMET No results for input(s): NA, K, CL, CO2, GLUCOSE, BUN, CREATININE, CALCIUM in the last 72 hours. PT/INR No results for input(s): LABPROT, INR in the last 72 hours. CMP     Component Value Date/Time   NA 141 04/04/2021 2154   NA 141 08/09/2020 1123   K 4.5 04/09/2021 2154   CL 109 04/15/2021 2154   CO2 24 04/12/2021 2154   GLUCOSE 146 (H) 04/23/2021 2154   BUN 30 (H) 03/31/2021 2154   BUN 25 08/09/2020 1123   CREATININE 1.11 04/17/2021 2154   CALCIUM 8.4 (L) 04/25/2021 2154   PROT 5.0 (L) 03/29/2021 2154   PROT 6.7 08/09/2020 1123   ALBUMIN 2.9 (L) 03/30/2021 2154   ALBUMIN 4.4 08/09/2020 1123   AST 46 (H) 04/14/2021 2154   ALT 25 04/22/2021 2154   ALKPHOS 24 (L) 04/19/2021 2154    BILITOT 0.9 04/08/2021 2154   BILITOT 0.7 08/09/2020 1123   GFRNONAA >60 04/17/2021 2154   GFRAA 65 08/09/2020 1123   Lipase  No results found for: LIPASE  Studies/Results: No results found.  Anti-infectives: Anti-infectives (From admission, onward)    Start     Dose/Rate Route Frequency Ordered Stop   04/13/2021 2200  ceFAZolin (ANCEF) IVPB 1 g/50 mL premix        1 g 100 mL/hr over 30 Minutes Intravenous Every 8 hours 04/12/2021 1607 04/22/21 0758   04/03/2021 1057  ceFAZolin (ANCEF) 2-4 GM/100ML-% IVPB       Note to Pharmacy: Rennis Harding   : cabinet override      04/21/2021 1057 04/08/2021 2259   04/19/21 0600  ceFAZolin (ANCEF) IVPB 2g/100 mL premix  Status:  Discontinued        2 g 200 mL/hr over 30 Minutes Intravenous On call to O.R. 04/25/2021 2008 04/01/2021 0559   04/07/2021 1200  ceFAZolin (ANCEF) IVPB 2g/100 mL premix  Status:  Discontinued        2 g 200 mL/hr over 30 Minutes Intravenous On call to O.R. 03/30/2021 0558 04/26/2021 2021        Assessment/Plan GLF Pelvic fracture, R femoral neck fracture - Per Ortho s/p cannulated hip pinning  by Dr. Edmonia Lynch on 10/23. WBAT RLE. PT/OT [facility] -  Cards consult for frequent small runs of VTACH. See below.  R lat flank/hip subcutaneous hematoma - Received TXA an d 1 unit of PRBCs at [facility]. Hgb stable on labs 2023/04/30.  Thrombocytopenia - improving. Plt 125 on labs 04/30/2023 Frequent Vtach/PVC/hx of CAD - Per Cards - recs as follows - when taking PO again would retern metoprolol tartrate 25 mg PO BID. Restarted 04-30-2023 now that he has passed for a diet. Zetia 10 mg is reasonable to return. If blood pressure becomes and issues with appropriate pain control, can add back ramipril 5 mg; otherwise this can be returned as outpatient. Cards s/o 10/24.  HTN - As above HLD Cerebrovascular disease Dementia  Hypothyroidism - PO synthroid. Frail state - palliative care talking with family. DNR Dysphagia - slp following Post op delirium/agitation -  Out of restraints. PRN haldol. Palliative added Buspar. Monitor QTc (474 on 10/24) Scrotal edema/bruising - will discuss with MD FEN - Dys1, nectar thick. Bowel regimen ID - ancef peri-op for Ortho procedure. None currently.  VTE - SCDs, lovenox. ASA as recommended by Cards. Will need ASA 60m bid x 30 days upon d/c per Ortho.  Foley - Out. Voiding.  Dispo - PT/OT. Palliative discussions undergoing. I met with the family today and discussed patients current medical status, progression with therapy etc. Family would like to meet with palliative again before making a final decision. I have discussed with their team who will see either later today or tomorrow. Appreciate their assistance.    LOS: 7 days    MJillyn Ledger, [facility] Hospital[facility] 04/25/2021, 9:04 AM Please see Amion for pager number during day hours 7:00am-4:30pm

## 2021-04-25 NOTE — Progress Notes (Addendum)
Patient's oxygen saturation levels dropped in to the 70%, patient sounding very congested and "gurgling" but lungs still clear/diminished.  Oxygen placed on patient via nasal cannula and oral suctioning performed to help clear airway.   Oxygen saturations improved but patient breathing through his mouth so venturi mask placed.  PA Maczis aware, PA advised to keep patient NPO at this time due to concern for aspiration.  Wife and niece at bedside and aware of patient status.

## 2021-04-25 NOTE — Evaluation (Signed)
THOccupational Therapy Evaluation Patient Details Name: [name] MRN: [id number] DOB: Mar 16, 1930 Today's Date: 04/25/2021   History of Present Illness Patient is a 87 y/o male who presents on 04/14/2021 with right femoral neck fx s/p fall at home, now s/p hip pinning 03/30/2021. PMH includes dementia, CAD, CVA, HTN, aortic stenosis s/p AVR.   Clinical Impression   This 87 yo male admitted and underwent above presents to acute OT with PLOF of being mobile with SPC and able to do some his basic ADLs. Currently pt is min A-total A for basic ADLs and +2 A for all mobility. He will continue to benefit from acute OT with follow up at SNF to work towards more functional mobility and ADLs.     Recommendations for follow up therapy are one component of a multi-disciplinary discharge planning process, led by the attending physician.  Recommendations may be updated based on patient status, additional functional criteria and insurance authorization.   Follow Up Recommendations  Skilled nursing-short term rehab (<3 hours/day)    Assistance Recommended at Discharge Frequent or constant Supervision/Assistance  Functional Status Assessment  Patient has had a recent decline in their functional status and/or demonstrates limited ability to make significant improvements in function in a reasonable and predictable amount of time  Equipment Recommendations  Other (comment) (TBD next venue)       Precautions / Restrictions Precautions Precautions: Fall Restrictions Weight Bearing Restrictions: No RLE Weight Bearing: Weight bearing as tolerated      Mobility Bed Mobility Overal bed mobility: Needs Assistance Bed Mobility: Supine to Sit     Supine to sit: Total assist;+2 for physical assistance;+2 for safety/equipment;HOB elevated     General bed mobility comments: total +2 all aspects, once sitting EOB pt sitting min guard.    Transfers Overall transfer level: Needs assistance Equipment used: 2  person hand held assist;Ambulation equipment used Transfers: Sit to/from Stand Sit to Stand: Mod assist;+2 physical assistance           General transfer comment: Mod +2 for power up, hip rise via bed pad facilitation, and steadying upon standing. STS x3, from EOB with HHA +2, back of recliner, and stedy. x1 stand once in stedy for transition stedy>recliner. Transfer via Lift Equipment: Stedy    Balance Overall balance assessment: Needs assistance;History of Falls Sitting-balance support: No upper extremity supported Sitting balance-Leahy Scale: Fair Sitting balance - Comments: min guard   Standing balance support: Bilateral upper extremity supported;During functional activity Standing balance-Leahy Scale: Poor Standing balance comment: reliant on PT/OT assist and bilat UE support                           ADL either performed or assessed with clinical judgement   ADL Overall ADL's : Needs assistance/impaired Eating/Feeding: Maximal assistance;Sitting;Bed level   Grooming: Wash/dry face;Minimal assistance;Sitting Grooming Details (indicate cue type and reason): EOB Upper Body Bathing: Moderate assistance;Sitting Upper Body Bathing Details (indicate cue type and reason): EOB Lower Body Bathing: Total assistance Lower Body Bathing Details (indicate cue type and reason): Mod A +2 sit<>stand, with one sit<>stand min A from stedy Upper Body Dressing : Maximal assistance;Sitting Upper Body Dressing Details (indicate cue type and reason): EOB Lower Body Dressing: Total assistance Lower Body Dressing Details (indicate cue type and reason): Mod A +2 sit<>stand, with one sit<>stand min A from [employer] Details (indicate cue type and reason): Use of stedy with Mod A +2 sit<>stand, with  one sit<>stand min A from stedy Toileting- Water quality scientist and Hygiene: Total assistance Toileting - Clothing Manipulation Details (indicate cue type and reason): Mod A +2  sit<>stand, with one sit<>stand min A from stedy       General ADL Comments: Performs ADL tasks with tactile cuing and task-specific objects (washes face with warm washcloth, dons glasses when placed in hands).     Vision Baseline Vision/History: 1 Wears glasses Ability to See in Adequate Light:  (unknown, pt has glasses on but not sure how well he sees) Patient Visual Report:  (unable to tell me)              Pertinent Vitals/Pain Pain Assessment: PAINAD Faces Pain Scale: Hurts a little bit Breathing: normal Negative Vocalization: occasional moan/groan, low speech, negative/disapproving quality Facial Expression: smiling or inexpressive Body Language: tense, distressed pacing, fidgeting Consolability: no need to console PAINAD Score: 2 Pain Location: RLE, with mobility Pain Descriptors / Indicators: Discomfort;Grimacing;Guarding;Moaning Pain Intervention(s): Limited activity within patient's tolerance;Monitored during session;Repositioned     Hand Dominance Right   Extremity/Trunk Assessment Upper Extremity Assessment Upper Extremity Assessment: Generalized weakness           Communication Communication Communication: Expressive difficulties;HOH (hears better out of right ear)   Cognition Arousal/Alertness: Awake/alert Behavior During Therapy: Restless Overall Cognitive Status: History of cognitive impairments - at baseline                                 General Comments: history of dementia, very HOH. A&Ox0. No evidence of combativeness or agitation today.     General Comments  afib rhythm, HR 110s-130s bpm during session            Home Living Family/patient expects to be discharged to:: Skilled nursing facility                                        Prior Functioning/Environment Prior Level of Function : Needs assist;Patient poor historian/Family not available             Mobility Comments: Per notes, pt was using  [facility] for mobility and needed assist with ADLs at baseline.          OT Problem List: Decreased strength;Decreased range of motion;Impaired balance (sitting and/or standing);Pain;Decreased cognition;Decreased safety awareness      OT Treatment/Interventions: Self-care/ADL training;DME and/or AE instruction;Patient/family education;Balance training    OT Goals(Current goals can be found in the care plan section) Acute Rehab OT Goals Patient Stated Goal: unable to state OT Goal Formulation: Patient unable to participate in goal setting Time For Goal Achievement: 05/09/21 Potential to Achieve Goals: Fair  OT Frequency: Min 2X/week           Co-evaluation   Reason for Co-Treatment: For patient/therapist safety;To address functional/ADL transfers;Necessary to address cognition/behavior during functional activity PT goals addressed during session: Mobility/safety with mobility;Balance        AM-PAC OT "6 Clicks" Daily Activity     Outcome Measure Help from another person eating meals?: A Lot Help from another person taking care of personal grooming?: A Lot Help from another person toileting, which includes using toliet, bedpan, or urinal?: A Lot Help from another person bathing (including washing, rinsing, drying)?: A Lot Help from another person to put on and taking off regular upper body clothing?: A Lot  Help from another person to put on and taking off regular lower body clothing?: Total 6 Click Score: 11   End of Session Equipment Utilized During Treatment:  (sara stedy, back to recliner with handles) Nurse Communication: Mobility status;Need for lift equipment  Activity Tolerance: Patient tolerated treatment well Patient left: in chair;with call bell/phone within reach;with chair alarm set  OT Visit Diagnosis: Unsteadiness on feet (R26.81);Other abnormalities of gait and mobility (R26.89);Muscle weakness (generalized) (M62.81);Pain;Other symptoms and signs involving cognitive  function Pain - Right/Left: Right Pain - part of body: Leg                Time: 5041-3643 OT Time Calculation (min): 29 min Charges:  OT General Charges $OT Visit: 1 Visit OT Evaluation $OT Eval Moderate Complexity: 1 Mod Golden Circle, OTR/L Acute [employer] Pager [phone] Office [phone]    Almon Register 04/25/2021, 11:51 AM

## 2021-04-25 NOTE — Progress Notes (Signed)
Physical Therapy Treatment Patient Details Name: [name] MRN: [id number] DOB: 19-Nov-1929 Today's Date: 04/25/2021   History of Present Illness Patient is a 87 y/o male who presents on 04/17/2021 with right femoral neck fx s/p fall at home, now s/p hip pinning 04/26/2021. PMH includes dementia, CAD, CVA, HTN, aortic stenosis s/p AVR.    PT Comments    Pt wakes easily when calling pt name in R ear, is pleasant and smiles at times during session. Pt requires mod-total +2 assist for bed mobility, repeated transfers to standing. Pt vocalizing in pain with stands, suspect due to R leg, but tolerated standing for ~1 minute at a time. Pt follows simple cues, especially when provided with tactile cuing or ADL tasks. SNF remains appropriate given slight mobility progression, however family discussions with palliative are ongoing. PT to continue to follow.      Recommendations for follow up therapy are one component of a multi-disciplinary discharge planning process, led by the attending physician.  Recommendations may be updated based on patient status, additional functional criteria and insurance authorization.  Follow Up Recommendations  Skilled nursing-short term rehab (<3 hours/day)     Assistance Recommended at Discharge    Equipment Recommendations  None recommended by PT    Recommendations for Other Services       Precautions / Restrictions Precautions Precautions: Fall Restrictions Weight Bearing Restrictions: Yes RLE Weight Bearing: Weight bearing as tolerated     Mobility  Bed Mobility Overal bed mobility: Needs Assistance Bed Mobility: Supine to Sit     Supine to sit: Total assist;+2 for physical assistance;+2 for safety/equipment;HOB elevated     General bed mobility comments: total +2 all aspects, once sitting EOB pt sitting min guard.    Transfers Overall transfer level: Needs assistance Equipment used: 2 person hand held assist;Ambulation equipment used (back of  recliner, stedy) Transfers: Sit to/from Stand Sit to Stand: Mod assist;+2 physical assistance           General transfer comment: Mod +2 for power up, hip rise via bed pad facilitation, and steadying upon standing. STS x3, from EOB with HHA +2, back of recliner, and stedy. x1 stand once in stedy for transition stedy>recliner. Transfer via Lift Equipment: Stedy  Ambulation/Gait             General Gait Details: nt   Marine scientist Rankin (Stroke Patients Only)       Balance Overall balance assessment: Needs assistance;History of Falls Sitting-balance support: No upper extremity supported Sitting balance-Leahy Scale: Fair Sitting balance - Comments: min guard   Standing balance support: Bilateral upper extremity supported;During functional activity Standing balance-Leahy Scale: Poor Standing balance comment: reliant on PT/OT assist and bilat UE support                            Cognition Arousal/Alertness: Awake/alert Behavior During Therapy: Restless Overall Cognitive Status: History of cognitive impairments - at baseline                                 General Comments: history of dementia, very HOH. A&Ox0. No evidence of combativeness or agitation today. Performs ADL tasks with tactile cuing and task-specific objects (washes face with warm washcloth, dons glasses when placed in hands).  Exercises      General Comments General comments (skin integrity, edema, etc.): afib rhythm, HR 110s-130s bpm during session      Pertinent Vitals/Pain Pain Assessment: Faces Faces Pain Scale: Hurts a little bit Breathing: normal Negative Vocalization: none Facial Expression: smiling or inexpressive Body Language: relaxed Consolability: no need to console PAINAD Score: 0 Pain Location: RLE, with mobility Pain Descriptors / Indicators: Discomfort;Grimacing;Guarding;Moaning Pain  Intervention(s): Limited activity within patient's tolerance;Monitored during session;Repositioned    Home Living                          Prior Function            PT Goals (current goals can now be found in the care plan section) Acute Rehab PT Goals PT Goal Formulation: With patient/family Time For Goal Achievement: 05/05/21 Potential to Achieve Goals: Fair Progress towards PT goals: Progressing toward goals    Frequency    Min 3X/week      PT Plan Current plan remains appropriate    Co-evaluation              AM-PAC PT "6 Clicks" Mobility   Outcome Measure  Help needed turning from your back to your side while in a flat bed without using bedrails?: A Lot Help needed moving from lying on your back to sitting on the side of a flat bed without using bedrails?: Total Help needed moving to and from a bed to a chair (including a wheelchair)?: A Lot Help needed standing up from a chair using your arms (e.g., wheelchair or bedside chair)?: A Lot Help needed to walk in hospital room?: Total Help needed climbing 3-5 steps with a railing? : Total 6 Click Score: 9    End of Session   Activity Tolerance: Patient tolerated treatment well;Patient limited by fatigue Patient left: in chair;with chair alarm set;with call bell/phone within reach Nurse Communication: Mobility status;Need for lift equipment (stedy) PT Visit Diagnosis: Muscle weakness (generalized) (M62.81);Difficulty in walking, not elsewhere classified (R26.2)     Time: 8416-6063 PT Time Calculation (min) (ACUTE ONLY): 29 min  Charges:  $Therapeutic Activity: 8-22 mins                     Stacie Glaze, PT DPT Acute Rehabilitation Services Pager [phone]  Office [phone]    [name] 04/25/2021, 11:00 AM

## 2021-04-25 NOTE — Progress Notes (Signed)
During patient care, patient's oxygen saturation level dropped in to the 40's and heart rate increased 130-150's.   RN placed patient on a non-rebreather at 15 liters oxygen and performed subglottal oral suction.   Patient's oxygen saturations improved to 100%.     Patients heart rate sustained 130-150's, per bedside monitor Atrial Fibrillation.  Paged MD Redmond Pulling and one time order for additional IV metoprolol dose obtained and administered to patient.   Heart rate improved to 110-120's.

## 2021-04-25 NOTE — Progress Notes (Signed)
Patient's wife and niece are at bedside.  PA Maczis has spoken with family and consulted Palliative Care team to help assist in answering family's questions.  Patient's wife has requested that Palliative Care team call patient's niece to discuss palliative options if family is not present at bedside.  Rana Snare (Niece) # [phone]

## 2021-04-26 ENCOUNTER — Inpatient Hospital Stay (HOSPITAL_COMMUNITY): Payer: Medicare Other

## 2021-04-26 DIAGNOSIS — Z7189 Other specified counseling: Secondary | ICD-10-CM | POA: Diagnosis not present

## 2021-04-26 DIAGNOSIS — Z515 Encounter for palliative care: Secondary | ICD-10-CM | POA: Diagnosis not present

## 2021-04-26 MED ORDER — MORPHINE SULFATE (PF) 2 MG/ML IV SOLN
2.0000 mg | Freq: Four times a day (QID) | INTRAVENOUS | Status: DC
  Administered 2021-04-26 – 2021-04-27 (×5): 2 mg via INTRAVENOUS
  Filled 2021-04-26 (×5): qty 1

## 2021-04-26 MED ORDER — GLYCOPYRROLATE 0.2 MG/ML IJ SOLN: 0.2000 mg | INTRAMUSCULAR | Status: DC | PRN

## 2021-04-26 MED ORDER — LORAZEPAM 2 MG/ML IJ SOLN: 0.5000 mg | INTRAMUSCULAR | Status: DC | PRN

## 2021-04-26 MED ORDER — GLYCOPYRROLATE 1 MG PO TABS: 1.0000 mg | ORAL_TABLET | ORAL | Status: DC | PRN

## 2021-04-26 MED ORDER — BIOTENE DRY MOUTH MT LIQD: 15.0000 mL | OROMUCOSAL | Status: DC | PRN

## 2021-04-26 MED ORDER — LORAZEPAM 2 MG/ML IJ SOLN
1.0000 mg | Freq: Four times a day (QID) | INTRAMUSCULAR | Status: DC
  Administered 2021-04-26 – 2021-04-27 (×4): 1 mg via INTRAVENOUS
  Filled 2021-04-26 (×4): qty 1

## 2021-04-26 MED ORDER — POLYVINYL ALCOHOL 1.4 % OP SOLN
1.0000 [drp] | Freq: Four times a day (QID) | OPHTHALMIC | Status: DC | PRN
  Administered 2021-04-26: 1 [drp] via OPHTHALMIC
  Filled 2021-04-26: qty 15

## 2021-04-26 MED ORDER — MORPHINE SULFATE (PF) 2 MG/ML IV SOLN: 1.0000 mg | INTRAVENOUS | Status: DC | PRN

## 2021-04-26 NOTE — Progress Notes (Signed)
Palliative Medicine Inpatient Follow Up Note  Consulting Provider: Georganna Skeans, MD   Reason for consult:   [place] Palliative Medicine Consult  Reason for Consult? 87yo DNR, hip and pelvic FXs, cardiac HX, medical non-compliance    HPI:  Per intake H&P --> 87 yo male with medical history significant for HTN, hyperlipidemia, CAD, cerebrovascular disease who presented to [facility] ED via EMS after suffering a ground level fall at home. S/P right hip pinning. The Palliative Care team was asked to get involved to further address goals of care in the setting of his frail state and chronic noncompliance.   Today's Discussion (04/26/2021):  *Please note that this is a verbal dictation therefore any spelling or grammatical errors are due to the "[employer]" system interpretation.  Chart reviewed.   I have met with Megan at bedside this morning.  He is noted to be somnolent with mittens on.  Heart rate is noted to be in the 120s and Raymir appears generally uncomfortable.  I spoke to patient's bedside RN, Earlie Server.  Earlie Server shares with me that Maruice likely aspirated overnight as he had an oxygen saturation to the 40s.  On a nonrebreather maximum amount of oxygen.  His heart rate had gone up to the 150s.   I met this late morning with patient's wife, Stanton Kidney and niece, Judson Roch.  We discussed the initial optimism for Dravyn situation to improve though the reality that he is endured a tremendous amount of burden postoperatively in the setting of pain, delirium, and now a severe aspirational event.  I asked Stanton Kidney if she wants [place] to continue with the present measures in an attempt to improve his situation or if she is now at a point where she feels we should allow Karel to be made as comfortable as possible and transition from this life to the next as peacefully as able.  Stanton Kidney shares with me that for days she is seeing his decline and expresses regretfully that we are at a point  where she and her family would like for him to be comfortable.  We talked about transition to comfort measures in house and what that would entail inclusive of medications to control pain, dyspnea, agitation, nausea, itching, and hiccups.   We discussed stopping all uneccessary measures such as cardiac monitoring, blood draws, needle sticks, and frequent vital signs.  Stanton Kidney agrees with these measures.  From a discharge [facility] would very much like Bo to be closer to her in the [place] area.  She and her nieces are all in agreement with transition to [facility].  I shared with Stanton Kidney that I suspect Brook will not be with [place] very long and once we stop aggressive interventions his life expectancy will be limited to days.  She is aware of this as per her expression of understanding.   Questions and concerns addressed   Objective Assessment: Vital Signs Vitals:   04/26/21 0800 04/26/21 0944  BP: 104/69   Pulse: (!) 133 (!) 127  Resp: (!) 27 19  Temp: 98.4 F (36.9 C)   SpO2: 98% 99%    Intake/Output Summary (Last 24 hours) at 04/26/2021 1138 Last data filed at 04/26/2021 1000 Gross per 24 hour  Intake 1315.5 ml  Output --  Net 1315.5 ml    Last Weight  Most recent update: 04/02/2021 11:14 AM    Weight  62.1 kg (137 lb)  Gen: Frail elderly male, in moderate distress HEENT: moist mucous membranes CV: Irregular rate and rhythm PULM: On 5 L nasal cannula this morning ABD: soft/nontender EXT: No edema, ecchymosis noted on bilateral upper extremities Neuro: Somnolent  SUMMARY OF RECOMMENDATIONS   DNAR/DNI   Patient had a significant aspiration event leading to a severe decline -patient's family have opted to proceed with comfort focused care.  Appreciate transitions of care team as patient's family would like for him to go to [facility] inpatient hospice  For comprehensive symptom management have initiated morphine and Ativan  around-the-clock  Patient's prognosis is limited to days especially as we start to wean supplemental oxygen  Palliative support provided  Time Spent:  60  Greater than 50% of the time was spent in counseling and coordination of care _________________________________________________________________________________ [place] Team Team Cell Phone: [phone] Please utilize secure chat with additional questions, if there is no response within 30 minutes please call the above phone number  Palliative Medicine Team providers are available by phone from 7am to 7pm daily and can be reached through the team cell phone.  Should this patient require assistance outside of these hours, please call the patient's attending physician.

## 2021-04-26 NOTE — Progress Notes (Signed)
6 Days Post-Op  Subjective: CC: Patient likely aspirated yesterday afternoon.  Now in a fib.  Minimally responsive today.  Placed on NRB overnight for desats into the 40s.  Turned NRB down this am to 5L and maintained 99%.  Objective: Vital signs in last 24 hours: Temp:  [97.5 F (36.4 C)-100.7 F (38.2 C)] 98.6 F (37 C) (10/29 0725) Pulse Rate:  [107-144] 119 (10/29 0725) Resp:  [17-32] 17 (10/29 0725) BP: (97-167)/(64-91) 97/67 (10/29 0725) SpO2:  [73 %-100 %] 100 % (10/29 0725) Last BM Date: 04/05/2021  Intake/Output from previous day: 10/28 0701 - 10/29 0700 In: 1638.6 [P.O.:70; I.V.:1568.6] Out: -  Intake/Output this shift: No intake/output data recorded.  PE: Gen:  NAD Card:  Tachycardic, irregular 120-130s Pulm:  CTAB, but decrease lung sounds at bases, slightly increase in respiratory effort. Abd: Soft, ND, no rigidity or guarding +BS Ext:  No LE edema.  Skin: no rashes noted, warm and dry Psych: does not really arouse for me this am except slight eye opening to name loudly.  Lab Results:  No results for input(s): WBC, HGB, HCT, PLT in the last 72 hours.  BMET No results for input(s): NA, K, CL, CO2, GLUCOSE, BUN, CREATININE, CALCIUM in the last 72 hours. PT/INR No results for input(s): LABPROT, INR in the last 72 hours. CMP     Component Value Date/Time   NA 141 04/07/2021 2154   NA 141 08/09/2020 1123   K 4.5 04/10/2021 2154   CL 109 04/23/2021 2154   CO2 24 04/12/2021 2154   GLUCOSE 146 (H) 04/24/2021 2154   BUN 30 (H) 04/05/2021 2154   BUN 25 08/09/2020 1123   CREATININE 1.11 04/12/2021 2154   CALCIUM 8.4 (L) 03/29/2021 2154   PROT 5.0 (L) 04/04/2021 2154   PROT 6.7 08/09/2020 1123   ALBUMIN 2.9 (L) 04/25/2021 2154   ALBUMIN 4.4 08/09/2020 1123   AST 46 (H) 04/11/2021 2154   ALT 25 04/21/2021 2154   ALKPHOS 24 (L) 04/19/2021 2154   BILITOT 0.9 04/13/2021 2154   BILITOT 0.7 08/09/2020 1123   GFRNONAA >60 04/11/2021 2154   GFRAA 65  08/09/2020 1123   Lipase  No results found for: LIPASE  Studies/Results: No results found.  Anti-infectives: Anti-infectives (From admission, onward)    Start     Dose/Rate Route Frequency Ordered Stop   04/08/2021 2200  ceFAZolin (ANCEF) IVPB 1 g/50 mL premix        1 g 100 mL/hr over 30 Minutes Intravenous Every 8 hours 04/15/2021 1607 04/22/21 0758   04/21/2021 1057  ceFAZolin (ANCEF) 2-4 GM/100ML-% IVPB       Note to Pharmacy: Rennis Harding   : cabinet override      04/12/2021 1057 04/02/2021 2259   04/19/21 0600  ceFAZolin (ANCEF) IVPB 2g/100 mL premix  Status:  Discontinued        2 g 200 mL/hr over 30 Minutes Intravenous On call to O.R. 04/07/2021 2008 04/22/2021 0559   04/06/2021 1200  ceFAZolin (ANCEF) IVPB 2g/100 mL premix  Status:  Discontinued        2 g 200 mL/hr over 30 Minutes Intravenous On call to O.R. 03/30/2021 0558 04/23/2021 2021        Assessment/Plan GLF Pelvic fracture, R femoral neck fracture - Per Ortho s/p cannulated hip pinning by Dr. Edmonia Lynch on 10/23. WBAT RLE. PT/OT [facility] -  Cards consult for frequent small runs of VTACH. See below.  R lat flank/hip subcutaneous hematoma -  Received TXA an d 1 unit [facility]BCs at [facility]. Hgb stable on labs 2023-05-20.  Thrombocytopenia - improving. Plt 125 on labs 2023/05/20 Frequent Vtach/PVC/hx of CAD/A fib - Per Cards - recs as follows - when taking PO again would retern metoprolol tartrate 25 mg PO BID. Restarted 20-May-2023 now that he has passed for a diet. Zetia 10 mg is reasonable to return. If blood pressure becomes and issues with appropriate pain control, can add back ramipril 5 mg; otherwise this can be returned as outpatient. Cards s/o 10/24.  given decision on hospice no plans for anticoagulation or specific further treatment.  He has lopressor ordered, but his BP is soft.  Will decrease rampiril to 5mg  so if we need to try some lopressor we can. HTN - As above HLD Cerebrovascular disease Dementia  Hypothyroidism - PO  synthroid. Frail state - palliative care talking with family. DNR Dysphagia/aspiration - slp following, likely aspirated yesterday.  Will check CXR to confirm this isn't any other new event, but suspect low grade temp, a fib, and desats is secondary to this.   Post op delirium/agitation - Out of restraints. PRN haldol. Palliative added Buspar. Monitor QTc (474 on 10/24) Scrotal edema/bruising - will discuss with MD FEN - Dys1, nectar thick. Bowel regimen ID - ancef peri-op for Ortho procedure. None currently.  VTE - SCDs, lovenox. ASA as recommended by Cards. Will need ASA 81mg  bid x 30 days upon d/c per Ortho.  Foley - Out. Voiding.  Dispo - PT/OT. Palliative discussions undergoing. I called the wife today to inform her that he is not doing as well as the events from overnight.  I don't think she quite understands the gravity of this new information.  I informed her that if he truly has new aspiration PNA, given his otherwise frail state, that residential hospice is likely the best thing for him.  She says "I don't know.  I am letting my niece help me decide on what to do but thanks for letting me know."  Apparently the niece and wife are coming up here around 10:30am.   LOS: 8 days    Henreitta Cea , [facility] Surgery 04/26/2021, 9:17 AM Please see Amion for pager number during day hours 7:00am-4:30pm

## 2021-04-27 DIAGNOSIS — Z515 Encounter for palliative care: Secondary | ICD-10-CM | POA: Diagnosis not present

## 2021-04-27 MED ORDER — GLYCOPYRROLATE 0.2 MG/ML IJ SOLN
0.4000 mg | INTRAMUSCULAR | Status: DC
  Administered 2021-04-27 (×2): 0.4 mg via INTRAVENOUS
  Filled 2021-04-27 (×2): qty 2

## 2021-04-29 NOTE — Progress Notes (Signed)
7 Days Post-Op  Subjective: CC: Patient appears to be actively dying.  Has a "death rattle"  sats currently 64% on RA and HR over 150.  Does not respond to me today.  Objective: Vital signs in last 24 hours: Temp:  [98.9 F (37.2 C)-99.4 F (37.4 C)] 98.9 F (37.2 C) (10/30 0812) Pulse Rate:  [122-134] 126 (10/30 0812) Resp:  [15-22] 22 (10/30 0203) BP: (89-111)/(61-66) 96/66 (10/30 0812) SpO2:  [79 %-99 %] 79 % (10/30 0812) Last BM Date: 04/19/2021  Intake/Output from previous day: 10/29 0701 - 10/30 0700 In: 895.8 [I.V.:895.8] Out: -  Intake/Output this shift: No intake/output data recorded.  PE: Gen:  unresponsive Card:  Tachycardic, irregular 150s Pulm:  diffuse rhonchi and has rattling going on Abd: Soft, ND Psych: unresponsive  Lab Results:  No results for input(s): WBC, HGB, HCT, PLT in the last 72 hours.  BMET No results for input(s): NA, K, CL, CO2, GLUCOSE, BUN, CREATININE, CALCIUM in the last 72 hours. PT/INR No results for input(s): LABPROT, INR in the last 72 hours. CMP     Component Value Date/Time   NA 141 04/22/2021 2154   NA 141 08/09/2020 1123   K 4.5 03/30/2021 2154   CL 109 04/05/2021 2154   CO2 24 04/10/2021 2154   GLUCOSE 146 (H) 04/26/2021 2154   BUN 30 (H) 04/13/2021 2154   BUN 25 08/09/2020 1123   CREATININE 1.11 03/30/2021 2154   CALCIUM 8.4 (L) 04/11/2021 2154   PROT 5.0 (L) 04/12/2021 2154   PROT 6.7 08/09/2020 1123   ALBUMIN 2.9 (L) 04/05/2021 2154   ALBUMIN 4.4 08/09/2020 1123   AST 46 (H) 04/26/2021 2154   ALT 25 04/23/2021 2154   ALKPHOS 24 (L) 04/07/2021 2154   BILITOT 0.9 04/19/2021 2154   BILITOT 0.7 08/09/2020 1123   GFRNONAA >60 04/28/2021 2154   GFRAA 65 08/09/2020 1123   Lipase  No results found for: LIPASE  Studies/Results: DG CHEST PORT 1 VIEW  Result Date: 04/26/2021 CLINICAL DATA:  Aspiration EXAM: PORTABLE CHEST 1 VIEW COMPARISON:  04/26/2021 FINDINGS: Sternotomy wires overlie normal cardiac  silhouette. Marked elevation of the LEFT hemidiaphragm. There is new airspace disease in the RIGHT lower lobe. Increased atelectasis in the LEFT upper lobe. IMPRESSION: 1. New airspace disease in the RIGHT lower lobe could represent perspiration pneumonitis. 2. Elevation of the LEFT hemidiaphragm with increased LEFT lung atelectasis. Cannot exclude LEFT lung pneumonia or aspiration. Electronically Signed   By: Suzy Bouchard M.D.   On: 04/26/2021 09:55    Anti-infectives: Anti-infectives (From admission, onward)    Start     Dose/Rate Route Frequency Ordered Stop   04/07/2021 2200  ceFAZolin (ANCEF) IVPB 1 g/50 mL premix        1 g 100 mL/hr over 30 Minutes Intravenous Every 8 hours 04/14/2021 1607 04/22/21 0758   04/13/2021 1057  ceFAZolin (ANCEF) 2-4 GM/100ML-% IVPB       Note to Pharmacy: Rennis Harding   : cabinet override      04/06/2021 1057 04/23/2021 2259   04/19/21 0600  ceFAZolin (ANCEF) IVPB 2g/100 mL premix  Status:  Discontinued        2 g 200 mL/hr over 30 Minutes Intravenous On call to O.R. 04/16/2021 2008 04/03/2021 0559   04/13/2021 1200  ceFAZolin (ANCEF) IVPB 2g/100 mL premix  Status:  Discontinued        2 g 200 mL/hr over 30 Minutes Intravenous On call to O.R. 04/25/2021 1610 04/26/2021  2021        Assessment/Plan GLF Pelvic fracture, R femoral neck fracture - Per Ortho s/p cannulated hip pinning by Dr. Edmonia Lynch on 10/23. WBAT RLE. PT/OT [facility] -  Cards consult for frequent small runs of VTACH. See below.  R lat flank/hip subcutaneous hematoma - Received TXA an d 1 unit of PRBCs at [facility]. Hgb stable on labs 2023-05-21.  Thrombocytopenia - improving. Plt 125 on labs May 21, 2023 Frequent Vtach/PVC/hx of CAD/A fib - Per Cards - recs as follows - when taking PO again would retern metoprolol tartrate 25 mg PO BID. Restarted May 21, 2023 now that he has passed for a diet. Zetia 10 mg is reasonable to return. If blood pressure becomes and issues with appropriate pain control, can add back ramipril 5 mg;  otherwise this can be returned as outpatient. Cards s/o 10/24.  given decision on hospice no plans for anticoagulation or specific further treatment.  He has lopressor ordered, but his BP is soft.  Will decrease rampiril to 5mg  so if we need to try some lopressor we can.  Transition to comfort, actively dying HTN - As above HLD Cerebrovascular disease Dementia  Hypothyroidism - PO synthroid. Frail state - palliative care talking with family. DNR Dysphagia/aspiration - slp following, likely aspirated Friday Post op delirium/agitation - Out of restraints. PRN haldol. Palliative added Buspar. Monitor QTc (474 on 10/24) Scrotal edema/bruising - will discuss with MD FEN - Dys1, nectar thick. Bowel regimen ID - ancef peri-op for Ortho procedure. None currently.  VTE - SCDs, lovenox. ASA as recommended by Cards. Will need ASA 81mg  bid x 30 days upon d/c per Ortho.  Foley - Out. Voiding.  Dispo - plan was for residential hospice, but patient appears to be actively dying.  Discussed with palliative care team   LOS: 9 days    Henreitta Cea , [facility] Surgery 24-May-2021, 9:41 AM Please see Amion for pager number during day hours 7:00am-4:30pm

## 2021-04-29 NOTE — Progress Notes (Addendum)
This chaplain responded to PMT consult for spiritual care.  The chaplain understands the Pt. has transitioned to comfort focused care.  Family is not at the bedside.   The chaplain is appreciative of the RN-Havy willingness to contact the chaplain for F/U spiritual care as needed.  Chaplain Sallyanne Kuster [phone]  **[phone] The chaplain is present with the Pt. wife-[name] and niece for story telling and lifting up the Pt. "satisfaction" in life.  The family accepted the chaplain's invitation for prayer and F/U spiritual care.

## 2021-04-29 NOTE — Progress Notes (Addendum)
Palliative Medicine Inpatient Follow Up Note  Consulting Provider: Georganna Skeans, MD   Reason for consult:   [place] Palliative Medicine Consult  Reason for Consult? 87yo DNR, hip and pelvic FXs, cardiac HX, medical non-compliance    HPI:  Per intake H&P --> 87 yo male with medical history significant for HTN, hyperlipidemia, CAD, cerebrovascular disease who presented to [facility] ED via EMS after suffering a ground level fall at home. S/P right hip pinning. The Palliative Care team was asked to get involved to further address goals of care in the setting of his frail state and chronic noncompliance.   Today's Discussion (06-May-2021):  *Please note that this is a verbal dictation therefore any spelling or grammatical errors are due to the "[employer]" system interpretation.  Chart reviewed.   I met with Dallon this morning.  He appeared comfortable his breathing was even and nonlabored.  I did remove his nasal cannula as he did not appear to need this any longer.  He continues to get Ativan and morphine around-the-clock for symptom management.  I secure chatted with [facility] medical social worker who will reach out to [facility] to identify if a bed has become available.  I spoke to patient's niece Judson Roch over the telephone and explained that Curren appears to be comfortable this morning.  Sarah and Kashmere's wife, Theodis Sato plan on coming in later this morning.  Questions and concerns addressed  _________________________________________________ Addendum:  I met with patient spouse and niece at bedside. We agreed that he has started the process of actively dying in house. We discussed the plan for him to stay as an inpatient presently for end of life care.  Anticipate in hospital death.   Objective Assessment: Vital Signs Vitals:   06-May-2021 0203 05-06-2021 0812  BP: (!) 89/62 96/66  Pulse: (!) 122 (!) 126  Resp: (!) 22   Temp: 99 F (37.2 C)  98.9 F (37.2 C)  SpO2: 97% (!) 79%    Intake/Output Summary (Last 24 hours) at 2021/05/06 0913 Last data filed at 05/06/2021 0800 Gross per 24 hour  Intake 895.8 ml  Output --  Net 895.8 ml    Last Weight  Most recent update: 04/17/2021 11:14 AM    Weight  62.1 kg (137 lb)            Gen: Frail elderly male, in NAD HEENT: dry mucous membranes CV: Irregular rate and rhythm PULM: On RA ABD: soft/nontender EXT: No edema, ecchymosis noted on bilateral upper extremities Neuro: Somnolent  SUMMARY OF RECOMMENDATIONS   DNAR/DNI   On 10/28 patient had a significant aspiration event leading to a severe decline - On 10/29 patient's family  opted to proceed with comfort focused care  Plan to transfer to [facility] when a bed is available  For comprehensive symptom management continue morphine and Ativan around-the-clock  Patient's prognosis is limited to days    Palliative support provided  Time Spent:  25 Greater than 50% of the time was spent in counseling and coordination of care _________________________________________________________________________________ [place] Team Team Cell Phone: [phone] Please utilize secure chat with additional questions, if there is no response within 30 minutes please call the above phone number  Palliative Medicine Team providers are available by phone from 7am to 7pm daily and can be reached through the team cell phone.  Should this patient require assistance outside of these hours, please call the patient's attending physician.

## 2021-04-29 DEATH — deceased

## 2021-05-29 NOTE — Death Summary Note (Signed)
DEATH SUMMARY   Patient Details  Name: [name] MRN: [id number] DOB: May 15, 1930  Admission/Discharge Information   Admit Date:  05-12-21  Date of Death: Date of Death: 05/21/2021  Time of Death: Time of Death: 10-06-48  Length of Stay: September 29, 2022  Referring Physician: [name], MD   Reason(s) for Hospitalization  87 yo male with medical history significant for HTN, hyperlipidemia, CAD, cerebrovascular disease who presented to [facility] ED via EMS after suffering a ground level fall at home.  Apparently he walks with a cane.  He and his wife sleep in separate rooms and she heard a "thud" this morning and found him on the floor.  Work up in ED significant for nondisplaced right femoral neck fracture, right acetabular fracture, right iliac fracture, left parasymphyseal and bilateral inferior pubic ramus fractures, and associated extraperitoneal hemorrhage anterior to the bladder and subcutaneous hematoma along the right lateral hip/flank.  He apparently received TXA at [facility] as well as 1 unit of pRBCs secondary to tachycardia and hypotension.  His hgb was 11.9.  CT head pending. CT C spine pending.   Due to traumatic injuries he was transferred to [facility] ED and trauma team asked to see.   Upon our entry patient is lethargic and will barely open his eyes if you yell in his ear.  He moans when you touch every part of his body.  He can not provide history or even communicate with [place] at this time.  He will not follow commands.  He did receive some morphine for pain prior to transfer.  He is a DNR.  Diagnoses  Preliminary cause of death: Cardiopulmonary arrest Secondary Diagnoses (including complications and co-morbidities):  Active Problems:   Closed right hip fracture [facility])   Brief Hospital Course (including significant findings, care, treatment, and services provided and events leading to death)  [name] is a 87 y.o. year old male who was admitted after a fall with a right hip fracture.  This was  repaired.  The patient was in a poor state of health prior to his arrival with limited mobility at that time.  The patient tried mobilization post operatively but this was limited secondary to age, recent surgery, and baseline deconditioning.  The family was trying to decide on SNF vs hospice and palliative care was involved to help with these discussions.  He was noted to have some cardiac arrhythmias while here as well.  He was seen by cardiology but given his DNR status, etc, no further treatment was recommended.  He was seen by speech for swallow evaluation due to his weakness.  He was placed on a D1 diet.  Unfortunately, it is likely that he aspirated on 10/28.  He rapidly deteriorated and expired while still in the hospital as he was actively dying and unable to make it to residential hospice that has been decided upon.     Pertinent Labs and Studies  Significant Diagnostic Studies CT Head Wo Contrast  Result Date: 05-12-2021 CLINICAL DATA:  Mechanical fall.  Unable to get up. EXAM: CT HEAD WITHOUT CONTRAST TECHNIQUE: Contiguous axial images were obtained from the base of the skull through the vertex without intravenous contrast. COMPARISON:  None. FINDINGS: Brain: Age related volume loss. Mild chronic small-vessel ischemic change of the white matter. No sign of acute infarction, mass lesion, hemorrhage, hydrocephalus or extra-axial collection. Vascular: There is atherosclerotic calcification of the major vessels at the base of the brain. Skull: Negative Sinuses/Orbits: Clear/normal Other: None IMPRESSION: No  acute or traumatic finding. Age related volume loss and chronic small-vessel ischemic change of the white matter. Electronically Signed   By: Nelson Chimes M.D.   On: 04/06/2021 14:53   CT CHEST W CONTRAST  Result Date: 04/03/2021 CLINICAL DATA:  Abdominal trauma EXAM: CT CHEST, ABDOMEN, AND PELVIS WITH CONTRAST TECHNIQUE: Multidetector CT imaging of the chest, abdomen and pelvis was  performed following the standard protocol during bolus administration of intravenous contrast. CONTRAST:  134mL OMNIPAQUE IOHEXOL 300 MG/ML  SOLN COMPARISON:  CT pelvis earlier same day, CT chest 2015 FINDINGS: CT CHEST FINDINGS Cardiovascular: Normal heart size. Aortic valve replacement. No pericardial effusion. Coronary artery calcification. Thoracic aorta is normal in caliber with calcified plaque. Mediastinum/Nodes: No mediastinal hematoma. Diaphragmatic hernia with much of the stomach, distal pancreas, and splenic flexure within the chest. No enlarged nodes. Lungs/Pleura: Chronic left basilar atelectasis adjacent to hernia. Dependent atelectasis on the right. Mild peribronchial thickening. Musculoskeletal: Chronic T7 and T8 compression fractures with marked loss of height and resulting focal kyphosis. No acute fracture. Prior median sternotomy. CT ABDOMEN PELVIS FINDINGS Hepatobiliary: No hepatic injury or perihepatic hematoma. Gallbladder is unremarkable. Pancreas: Normal main pancreatic duct. No apparent obstructing lesion. Spleen: Unremarkable. Adrenals/Urinary Tract: Adrenals are unremarkable. Bilateral renal cysts. Bladder is unremarkable. Stomach/Bowel: Stomach is within normal limits. Bowel is normal in caliber. Vascular/Lymphatic: Aortic atherosclerosis. No enlarged lymph nodes. Reproductive: Unremarkable. Other: Slightly increased hemorrhage adjacent to the bladder. Subcutaneous hemorrhage right hip/thigh. Musculoskeletal: Acute fractures of the pelvis as previously described. There is also a nondisplaced fracture of the right iliac bone (series 2, image 77). IMPRESSION: Slight increase in extraperitoneal hemorrhage at the level of the bladder compared to earlier CT pelvis. In addition to previously described pelvic fractures, there is also an acute nondisplaced fracture of the right iliac bone. Chronic diaphragmatic hernia with abdominal contents within the left hemithorax. Top-normal main pancreatic  duct without apparent obstructing lesion. Aortic atherosclerosis. Electronically Signed   By: Macy Mis M.D.   On: 04/07/2021 08:24   CT Cervical Spine Wo Contrast  Result Date: 04/28/2021 CLINICAL DATA:  [place].  Found on the floor. EXAM: CT CERVICAL SPINE WITHOUT CONTRAST TECHNIQUE: Multidetector CT imaging of the cervical spine was performed without intravenous contrast. Multiplanar CT image reconstructions were also generated. COMPARISON:  None FINDINGS: Alignment: The study suffers from motion degradation. No traumatic malalignment. Skull base and vertebrae: No evidence of regional fracture or focal bone lesion. Soft tissues and spinal canal: No evidence of soft tissue swelling. Disc levels: Motion degradation as noted above. Facet osteoarthritis on the right at C2-3, C3-4 and C7-T1 and on the left at C3-4 and C4-5. Disc space narrowing throughout the cervical region. Vacuum phenomenon of the C6-7 level, probably degenerative. Upper chest: Not included Other: Negative/none IMPRESSION: No acute or traumatic finding. Motion degraded examination. Chronic appearing spondylosis and facet osteoarthritis without acute traumatic finding. Electronically Signed   By: Nelson Chimes M.D.   On: 04/25/2021 14:56   CT PELVIS WO CONTRAST  Result Date: 04/17/2021 CLINICAL DATA:  Right hip pain EXAM: CT PELVIS WITHOUT CONTRAST TECHNIQUE: Multidetector CT imaging of the pelvis was performed following the standard protocol without intravenous contrast. COMPARISON:  None. FINDINGS: Urinary Tract: Bladder is within normal limits. Extraperitoneal hemorrhage anterior to the bladder in the lower pelvis (series 5/image 58). Bowel:  Visualized bowel is unremarkable. Vascular/Lymphatic: No evidence of aneurysm. Atherosclerotic calcifications. No suspicious pelvic lymphadenopathy. Reproductive:  Prostate is unremarkable. Other:  Pelvic ascites. Musculoskeletal: Nondisplaced transcervical right femoral  neck fracture (coronal  image 55). Mild degenerative changes of the right hip. Comminuted left parasymphyseal fracture (coronal image 43). Right anterior column acetabular fracture (series 3/image 71). Mildly displaced bilateral inferior pubic rami fractures (series 3/image 98). Subcutaneous hematoma along the right lateral hip/flank (series 5/image 102). Degenerative changes of the lumbar spine. IMPRESSION: Nondisplaced transcervical right femoral neck fracture. Right anterior column acetabular fracture. Left parasymphyseal and bilateral inferior pubic ramus fractures, as above. Associated extraperitoneal hemorrhage anterior to the bladder in the lower pelvis. Subcutaneous hematoma along the right lateral hip/flank. Electronically Signed   By: Julian Hy M.D.   On: 03/30/2021 03:53   CT ABDOMEN PELVIS W CONTRAST  Result Date: 04/21/2021 CLINICAL DATA:  Abdominal trauma EXAM: CT CHEST, ABDOMEN, AND PELVIS WITH CONTRAST TECHNIQUE: Multidetector CT imaging of the chest, abdomen and pelvis was performed following the standard protocol during bolus administration of intravenous contrast. CONTRAST:  145mL OMNIPAQUE IOHEXOL 300 MG/ML  SOLN COMPARISON:  CT pelvis earlier same day, CT chest 2015 FINDINGS: CT CHEST FINDINGS Cardiovascular: Normal heart size. Aortic valve replacement. No pericardial effusion. Coronary artery calcification. Thoracic aorta is normal in caliber with calcified plaque. Mediastinum/Nodes: No mediastinal hematoma. Diaphragmatic hernia with much of the stomach, distal pancreas, and splenic flexure within the chest. No enlarged nodes. Lungs/Pleura: Chronic left basilar atelectasis adjacent to hernia. Dependent atelectasis on the right. Mild peribronchial thickening. Musculoskeletal: Chronic T7 and T8 compression fractures with marked loss of height and resulting focal kyphosis. No acute fracture. Prior median sternotomy. CT ABDOMEN PELVIS FINDINGS Hepatobiliary: No hepatic injury or perihepatic hematoma.  Gallbladder is unremarkable. Pancreas: Normal main pancreatic duct. No apparent obstructing lesion. Spleen: Unremarkable. Adrenals/Urinary Tract: Adrenals are unremarkable. Bilateral renal cysts. Bladder is unremarkable. Stomach/Bowel: Stomach is within normal limits. Bowel is normal in caliber. Vascular/Lymphatic: Aortic atherosclerosis. No enlarged lymph nodes. Reproductive: Unremarkable. Other: Slightly increased hemorrhage adjacent to the bladder. Subcutaneous hemorrhage right hip/thigh. Musculoskeletal: Acute fractures of the pelvis as previously described. There is also a nondisplaced fracture of the right iliac bone (series 2, image 77). IMPRESSION: Slight increase in extraperitoneal hemorrhage at the level of the bladder compared to earlier CT pelvis. In addition to previously described pelvic fractures, there is also an acute nondisplaced fracture of the right iliac bone. Chronic diaphragmatic hernia with abdominal contents within the left hemithorax. Top-normal main pancreatic duct without apparent obstructing lesion. Aortic atherosclerosis. Electronically Signed   By: Macy Mis M.D.   On: 04/13/2021 08:24   Pelvis Portable  Result Date: 04/08/2021 CLINICAL DATA:  Status post ORIF of the RIGHT femur EXAM: PORTABLE PELVIS 1-2 VIEWS COMPARISON:  April 18, 2021. FINDINGS: Status post ORIF of the RIGHT femur. Orthopedic hardware is intact and without periprosthetic fracture or lucency. Near anatomic alignment of the subcapital femur. Revisualization of a comminuted fracture of the LEFT inferior and superior pubic ramus. Revisualization of the mildly displaced fracture of the RIGHT superior pubic rami extending into the acetabulum. Revisualization of a mildly displaced fracture of the RIGHT inferior pubic ramus. Soft tissue air. Vascular calcifications. IMPRESSION: Expected postsurgical appearance status post ORIF of the RIGHT femur. Electronically Signed   By: Valentino Saxon M.D.   On:  04/03/2021 16:25   DG CHEST PORT 1 VIEW  Result Date: 04/26/2021 CLINICAL DATA:  Aspiration EXAM: PORTABLE CHEST 1 VIEW COMPARISON:  04/26/2021 FINDINGS: Sternotomy wires overlie normal cardiac silhouette. Marked elevation of the LEFT hemidiaphragm. There is new airspace disease in the RIGHT lower lobe. Increased atelectasis in the LEFT upper lobe.  IMPRESSION: 1. New airspace disease in the RIGHT lower lobe could represent perspiration pneumonitis. 2. Elevation of the LEFT hemidiaphragm with increased LEFT lung atelectasis. Cannot exclude LEFT lung pneumonia or aspiration. Electronically Signed   By: Suzy Bouchard M.D.   On: 04/26/2021 09:55   DG Chest Port 1 View  Result Date: 04/19/2021 CLINICAL DATA:  Preop exam EXAM: PORTABLE CHEST 1 VIEW COMPARISON:  07/28/2016, CT 07/23/2013 FINDINGS: Normal cardiac silhouette with midline sternotomy. Large diaphragmatic hernia on the LEFT with stomach and bowel within the hernia sac. Hernia occupies 2/3 of the LEFT hemithorax. No pulmonary edema. No pneumothorax. Focal infiltrate. IMPRESSION: 1. Progressive large LEFT diaphragmatic hernia. 2. No acute findings. Electronically Signed   By: Suzy Bouchard M.D.   On: 04/14/2021 05:13   DG Swallowing Func-Speech Pathology  Result Date: 04/22/2021 Table formatting from the original result was not included. Objective Swallowing Evaluation: Type of Study: MBS-Modified Barium Swallow Study  Patient Details Name: TALLON GERTZ MRN: [id number] Date of Birth: 01/29/1930 Today's Date: 04/22/2021 Time: SLP Start Time (ACUTE ONLY): 1330 -SLP Stop Time (ACUTE ONLY): 3976 SLP Time Calculation (min) (ACUTE ONLY): 24 min Past Medical History: Past Medical History: Diagnosis Date  Aortic stenosis   a. s/p tissue AVR 08/2013;  b. Echo (09/2013):  Mild LVH, EF 55-60%, no RWMA, Gr 1 DD, AVR ok (mean gradient 9 mmHg), mild MR, mild LAE, mildly reduced RVSF, mild TR, PASP 32 mmHg.  CAD (coronary artery disease)   Cerebrovascular  disease, unspecified   Dementia (HCC)   Glaucoma   Bilateral eyes  HLD (hyperlipidemia)   HOH (hard of hearing)   wears bilateral hearing aids  HTN (hypertension)   Pneumonia 07/23/2013  S/P aortic valve replacement with bioprosthetic valve 09/13/2013  25 mm [facility] Ease bovine bioprosthetic tissue valve  Skin cancer of face   removed from nose Past Surgical History: Past Surgical History: Procedure Laterality Date  AORTIC VALVE REPLACEMENT N/A 09/13/2013  Procedure: AORTIC VALVE REPLACEMENT (AVR);  Surgeon: Rexene Alberts, MD;  Location: [place];  Service: Open Heart Surgery;  Laterality: N/A;  CARDIAC CATHETERIZATION    no PCI  CAROTID ENDARTERECTOMY Right   CATARACT EXTRACTION Bilateral   COLONOSCOPY W/ POLYPECTOMY    EYE SURGERY    HIP PINNING,CANNULATED Right 04/07/2021  Procedure: CANNULATED HIP PINNING;  Surgeon: Renette Butters, MD;  Location: [place];  Service: Orthopedics;  Laterality: Right;  INTRAOPERATIVE TRANSESOPHAGEAL ECHOCARDIOGRAM N/A 09/13/2013  Procedure: INTRAOPERATIVE TRANSESOPHAGEAL ECHOCARDIOGRAM;  Surgeon: Rexene Alberts, MD;  Location: [place];  Service: Open Heart Surgery;  Laterality: N/A;  LEFT AND RIGHT HEART CATHETERIZATION WITH CORONARY ANGIOGRAM N/A 07/26/2013  Procedure: LEFT AND RIGHT HEART CATHETERIZATION WITH CORONARY ANGIOGRAM;  Surgeon: Sinclair Grooms, MD;  Location: [facility] CATH LAB;  Service: Cardiovascular;  Laterality: N/A;  RETINAL DETACHMENT SURGERY   HPI: Patient is a 87 y/o male who presents on 04/04/2021 with right femoral neck fx s/p fall at home, now s/p hip pinning 04/22/2021. PMH includes dementia, CAD, CVA, HTN, aortic stenosis s/p AVR. He eats softer foods per wife. She denies difficulty swallowing before admission.  Subjective: Pt awake and alert Assessment / Plan / Recommendation CHL IP CLINICAL IMPRESSIONS 04/22/2021 Clinical Impression Pt was seen for an MBS. Overall, pt presents with severe dysphagia secondary to decreased strength and coordination of oral and  pharyngeal phases of swallowing. Across consistencies, pt exhibited decreased bolus cohesion, delayed oral transit, and weak lingual manipulation with impaired mastication of solids, lingual  pumping with puree, and piecemeal swallowing of honey thick liquid. Pharyngeal phase deficits c/b reduced base of tongue retraction, reduced anterior laryngeal excursion, reduced epiglottic inversion, reduced airway closure and reduced laryngeal elevation resulting in residue in the vallecula, pyriform sinuses, posterior pharyngeal wall, and cricopharyngeal segment across consistencies. Liquids attempted to enter his nasal cavity intermittently due to questionable incomplete velopharyngeal closue. Reduced airway closure resulted in aspiration that did not clear despite attempt (PAS 7) with thin liquid and penetration above the level of the vocal folds that did not clear (PAS 3) with nectar thick. Pt exhibited no penetration/aspiration with honey thick, however residue was increased. Recommend Dys2/nectar thick liquid diet to aid in bolus preparation with solids and mitigate chances of aspiration with liquids although aspiration risk is high with all consistencies. SLP will follow for management of diet tolerance, upgraded PO trails and agree with palliative care consult as swallow is likely to deteriorate. Suspect pt's swallow function may have been weakened prior and now unable to compensate with this acute illness. SLP Visit Diagnosis Dysphagia, oral phase (R13.11);Dysphagia, pharyngeal phase (R13.13);Dysphagia, pharyngoesophageal phase (R13.14) Attention and concentration deficit following -- Frontal lobe and executive function deficit following -- Impact on safety and function Severe aspiration risk   CHL IP TREATMENT RECOMMENDATION 04/22/2021 Treatment Recommendations Therapy as outlined in treatment plan below   Prognosis 04/22/2021 Prognosis for Safe Diet Advancement Fair Barriers to Reach Goals Cognitive  deficits;Severity of deficits Barriers/Prognosis Comment -- CHL IP DIET RECOMMENDATION 04/22/2021 SLP Diet Recommendations Dysphagia 2 (Fine chop) solids;Nectar thick liquid Liquid Administration via Cup Medication Administration Crushed with puree Compensations Minimize environmental distractions;Slow rate;Small sips/bites Postural Changes Remain semi-upright after after feeds/meals (Comment);Seated upright at 90 degrees   CHL IP OTHER RECOMMENDATIONS 04/22/2021 Recommended Consults -- Oral Care Recommendations Oral care BID Other Recommendations Order thickener from pharmacy   CHL IP FOLLOW UP RECOMMENDATIONS 04/22/2021 Follow up Recommendations 24 hour supervision/assistance   CHL IP FREQUENCY AND DURATION 04/22/2021 Speech Therapy Frequency (ACUTE ONLY) min 2x/week Treatment Duration 2 weeks      CHL IP ORAL PHASE 04/22/2021 Oral Phase Impaired Oral - Pudding Teaspoon -- Oral - Pudding Cup -- Oral - Honey Teaspoon -- Oral - Honey Cup Weak lingual manipulation;Delayed oral transit;Decreased bolus cohesion Oral - Nectar Teaspoon -- Oral - Nectar Cup Weak lingual manipulation;Delayed oral transit;Decreased bolus cohesion Oral - Nectar Straw -- Oral - Thin Teaspoon -- Oral - Thin Cup Piecemeal swallowing;Weak lingual manipulation;Delayed oral transit;Decreased bolus cohesion Oral - Thin Straw -- Oral - Puree Impaired mastication;Weak lingual manipulation;Lingual pumping;Reduced posterior propulsion;Delayed oral transit;Decreased bolus cohesion Oral - Mech Soft -- Oral - Regular Weak lingual manipulation;Lingual pumping;Impaired mastication;Reduced posterior propulsion;Delayed oral transit;Decreased bolus cohesion Oral - Multi-Consistency -- Oral - Pill -- Oral Phase - Comment --  CHL IP PHARYNGEAL PHASE 04/22/2021 Pharyngeal Phase Impaired Pharyngeal- Pudding Teaspoon -- Pharyngeal -- Pharyngeal- Pudding Cup -- Pharyngeal -- Pharyngeal- Honey Teaspoon -- Pharyngeal -- Pharyngeal- Honey Cup Pharyngeal residue -  posterior pharnyx;Pharyngeal residue - pyriform;Pharyngeal residue - valleculae;Pharyngeal residue - cp segment;Reduced laryngeal elevation;Reduced epiglottic inversion;Reduced anterior laryngeal mobility;Reduced airway/laryngeal closure;Reduced tongue base retraction Pharyngeal Material does not enter airway Pharyngeal- Nectar Teaspoon -- Pharyngeal -- Pharyngeal- Nectar Cup Reduced epiglottic inversion;Reduced anterior laryngeal mobility;Reduced laryngeal elevation;Reduced airway/laryngeal closure;Reduced tongue base retraction;Penetration/Aspiration during swallow;Pharyngeal residue - valleculae;Pharyngeal residue - pyriform;Pharyngeal residue - posterior pharnyx;Pharyngeal residue - cp segment Pharyngeal Material enters airway, remains ABOVE vocal cords and not ejected out Pharyngeal- Nectar Straw -- Pharyngeal -- Pharyngeal- Thin Teaspoon -- Pharyngeal --  Pharyngeal- Thin Cup Penetration/Aspiration during swallow;Moderate aspiration;Pharyngeal residue - valleculae;Pharyngeal residue - pyriform;Pharyngeal residue - posterior pharnyx;Pharyngeal residue - cp segment;Nasopharyngeal reflux;Reduced epiglottic inversion;Reduced anterior laryngeal mobility;Reduced laryngeal elevation;Reduced airway/laryngeal closure;Reduced tongue base retraction Pharyngeal Material enters airway, passes BELOW cords and not ejected out despite cough attempt by patient Pharyngeal- Thin Straw -- Pharyngeal -- Pharyngeal- Puree Reduced epiglottic inversion;Reduced anterior laryngeal mobility;Reduced laryngeal elevation;Reduced airway/laryngeal closure;Reduced tongue base retraction Pharyngeal Material does not enter airway Pharyngeal- Mechanical Soft -- Pharyngeal -- Pharyngeal- Regular Pharyngeal residue - cp segment;Pharyngeal residue - posterior pharnyx;Pharyngeal residue - pyriform;Pharyngeal residue - valleculae;Reduced epiglottic inversion;Reduced anterior laryngeal mobility;Reduced airway/laryngeal closure;Reduced laryngeal  elevation;Reduced tongue base retraction Pharyngeal Material does not enter airway Pharyngeal- Multi-consistency -- Pharyngeal -- Pharyngeal- Pill -- Pharyngeal -- Pharyngeal Comment --  CHL IP CERVICAL ESOPHAGEAL PHASE 04/22/2021 Cervical Esophageal Phase Impaired Pudding Teaspoon -- Pudding Cup -- Honey Teaspoon -- Honey Cup -- Nectar Teaspoon -- Nectar Cup -- Nectar Straw -- Thin Teaspoon -- Thin Cup -- Thin Straw -- Puree -- Mechanical Soft -- Regular -- Multi-consistency -- Pill -- Cervical Esophageal Comment -- [name] 04/22/2021, 4:53 PM              DG C-Arm 1-60 Min-No Report  Result Date: 04/26/2021 Fluoroscopy was utilized by the requesting physician.  No radiographic interpretation.   DG HIP OPERATIVE UNILAT WITH PELVIS RIGHT  Result Date: 04/14/2021 CLINICAL DATA:  Hip fracture. EXAM: OPERATIVE RIGHT HIP (WITH PELVIS IF PERFORMED)  VIEWS TECHNIQUE: Fluoroscopic spot image(s) were submitted for interpretation post-operatively. COMPARISON:  Hip radiograph 04/05/2021; CT pelvis 04/24/2021 FINDINGS: Fluoroscopic images were obtained intraoperatively and submitted for post operative interpretation. Three right cannulated screws with hardware in expected position, 1 image was obtained with 50 seconds of fluoroscopy time. Please see the performing provider's procedural report for further detail. Right anterior column acetabular fracture again demonstrated. IMPRESSION: Right hip hardware in appropriate position. Electronically Signed   By: Ileana Roup M.D.   On: 04/11/2021 16:01   DG Hip Unilat W or Wo Pelvis 2-3 Views Right  Result Date: 04/16/2021 CLINICAL DATA:  Recent fall with right hip pain, initial encounter EXAM: DG HIP (WITH OR WITHOUT PELVIS) 3V RIGHT COMPARISON:  None. FINDINGS: Considerable soft tissue swelling is noted over the greater trochanter laterally. Irregularity of the superior pubic ramus on the right is noted and there is lucency identified on 1 of the frontal  films. Suspicious lucency is noted in the femoral neck as well without displacement. Degenerative changes of the hip joint are seen. IMPRESSION: Findings suspicious for undisplaced superior pubic ramus fracture and right femoral neck fracture. CT of the pelvis is recommended for further evaluation to exclude occult fracture. Electronically Signed   By: Inez Catalina M.D.   On: 04/26/2021 02:24    Microbiology No results found for this or any previous visit (from the past 240 hour(s)).  Lab Basic Metabolic Panel: No results for input(s): NA, K, CL, CO2, GLUCOSE, BUN, CREATININE, CALCIUM, MG, PHOS in the last 168 hours. Liver Function Tests: No results for input(s): AST, ALT, ALKPHOS, BILITOT, PROT, ALBUMIN in the last 168 hours. No results for input(s): LIPASE, AMYLASE in the last 168 hours. No results for input(s): AMMONIA in the last 168 hours. CBC: No results for input(s): WBC, NEUTROABS, HGB, HCT, MCV, PLT in the last 168 hours. Cardiac Enzymes: No results for input(s): CKTOTAL, CKMB, CKMBINDEX, TROPONINI in the last 168 hours. Sepsis Labs: No results for input(s): PROCALCITON, WBC, LATICACIDVEN in the last  168 hours.  Procedures/Operations  Dr. Fredonia Highland, 04/14/2021 CANNULATED HIP PINNING   Henreitta Cea 05/01/2021, 10:58 AM

## 2021-07-08 ENCOUNTER — Ambulatory Visit: Payer: Medicare Other | Admitting: Cardiology

## 2021-09-10 ENCOUNTER — Ambulatory Visit: Payer: Medicare Other | Admitting: Cardiology
